# Patient Record
Sex: Male | Born: 1950
Health system: Southern US, Community
[De-identification: ages and names within clinical notes are randomized; demographics above are authoritative.]

## PROBLEM LIST (undated history)

## (undated) DIAGNOSIS — I10 Essential (primary) hypertension: Secondary | ICD-10-CM

## (undated) DIAGNOSIS — K219 Gastro-esophageal reflux disease without esophagitis: Secondary | ICD-10-CM

## (undated) DIAGNOSIS — R011 Cardiac murmur, unspecified: Secondary | ICD-10-CM

## (undated) DIAGNOSIS — C61 Malignant neoplasm of prostate: Secondary | ICD-10-CM

## (undated) DIAGNOSIS — J3089 Other allergic rhinitis: Secondary | ICD-10-CM

## (undated) DIAGNOSIS — Z923 Personal history of irradiation: Secondary | ICD-10-CM

## (undated) DIAGNOSIS — Z973 Presence of spectacles and contact lenses: Secondary | ICD-10-CM

## (undated) DIAGNOSIS — Z87898 Personal history of other specified conditions: Secondary | ICD-10-CM

## (undated) DIAGNOSIS — G4733 Obstructive sleep apnea (adult) (pediatric): Secondary | ICD-10-CM

## (undated) DIAGNOSIS — E7800 Pure hypercholesterolemia, unspecified: Secondary | ICD-10-CM

## (undated) DIAGNOSIS — M1990 Unspecified osteoarthritis, unspecified site: Secondary | ICD-10-CM

---

## 1997-04-01 HISTORY — PX: PROSTATECTOMY: SHX69

## 1997-12-22 ENCOUNTER — Ambulatory Visit (HOSPITAL_COMMUNITY): Admission: RE | Admit: 1997-12-22 | Discharge: 1997-12-22 | Payer: Self-pay | Admitting: *Deleted

## 1998-07-05 ENCOUNTER — Ambulatory Visit (HOSPITAL_COMMUNITY): Admission: RE | Admit: 1998-07-05 | Discharge: 1998-07-05 | Payer: Self-pay | Admitting: *Deleted

## 2000-01-10 ENCOUNTER — Encounter: Payer: Self-pay | Admitting: *Deleted

## 2000-01-10 ENCOUNTER — Ambulatory Visit: Admission: RE | Admit: 2000-01-10 | Discharge: 2000-01-10 | Payer: Self-pay | Admitting: *Deleted

## 2001-02-19 ENCOUNTER — Ambulatory Visit (HOSPITAL_BASED_OUTPATIENT_CLINIC_OR_DEPARTMENT_OTHER): Admission: RE | Admit: 2001-02-19 | Discharge: 2001-02-19 | Payer: Self-pay | Admitting: Orthopedic Surgery

## 2001-02-19 ENCOUNTER — Encounter (INDEPENDENT_AMBULATORY_CARE_PROVIDER_SITE_OTHER): Payer: Self-pay | Admitting: *Deleted

## 2001-02-19 HISTORY — PX: OTHER SURGICAL HISTORY: SHX169

## 2001-12-15 ENCOUNTER — Emergency Department (HOSPITAL_COMMUNITY): Admission: EM | Admit: 2001-12-15 | Discharge: 2001-12-15 | Payer: Self-pay | Admitting: Emergency Medicine

## 2002-02-03 ENCOUNTER — Encounter: Payer: Self-pay | Admitting: Urology

## 2002-02-03 ENCOUNTER — Encounter: Admission: RE | Admit: 2002-02-03 | Discharge: 2002-02-03 | Payer: Self-pay | Admitting: Urology

## 2002-09-15 ENCOUNTER — Encounter: Payer: Self-pay | Admitting: *Deleted

## 2002-09-15 ENCOUNTER — Ambulatory Visit (HOSPITAL_COMMUNITY): Admission: RE | Admit: 2002-09-15 | Discharge: 2002-09-15 | Payer: Self-pay | Admitting: *Deleted

## 2002-12-04 ENCOUNTER — Encounter: Payer: Self-pay | Admitting: Orthopedic Surgery

## 2002-12-08 ENCOUNTER — Encounter: Payer: Self-pay | Admitting: Orthopedic Surgery

## 2002-12-08 ENCOUNTER — Inpatient Hospital Stay (HOSPITAL_COMMUNITY): Admission: RE | Admit: 2002-12-08 | Discharge: 2002-12-11 | Payer: Self-pay | Admitting: Orthopedic Surgery

## 2002-12-08 HISTORY — PX: TOTAL HIP ARTHROPLASTY: SHX124

## 2002-12-12 ENCOUNTER — Emergency Department (HOSPITAL_COMMUNITY): Admission: EM | Admit: 2002-12-12 | Discharge: 2002-12-13 | Payer: Self-pay | Admitting: Emergency Medicine

## 2002-12-12 ENCOUNTER — Encounter: Payer: Self-pay | Admitting: Emergency Medicine

## 2003-07-03 ENCOUNTER — Ambulatory Visit (HOSPITAL_BASED_OUTPATIENT_CLINIC_OR_DEPARTMENT_OTHER): Admission: RE | Admit: 2003-07-03 | Discharge: 2003-07-03 | Payer: Self-pay | Admitting: Family Medicine

## 2003-07-22 ENCOUNTER — Ambulatory Visit (HOSPITAL_COMMUNITY): Admission: RE | Admit: 2003-07-22 | Discharge: 2003-07-22 | Payer: Self-pay | Admitting: Family Medicine

## 2003-07-22 ENCOUNTER — Encounter: Payer: Self-pay | Admitting: Cardiovascular Disease

## 2005-01-01 ENCOUNTER — Inpatient Hospital Stay (HOSPITAL_COMMUNITY): Admission: EM | Admit: 2005-01-01 | Discharge: 2005-01-04 | Payer: Self-pay | Admitting: Emergency Medicine

## 2005-01-03 ENCOUNTER — Encounter (INDEPENDENT_AMBULATORY_CARE_PROVIDER_SITE_OTHER): Payer: Self-pay | Admitting: Cardiology

## 2005-01-03 HISTORY — PX: TRANSTHORACIC ECHOCARDIOGRAM: SHX275

## 2005-01-04 ENCOUNTER — Ambulatory Visit: Payer: Self-pay | Admitting: Internal Medicine

## 2005-01-09 ENCOUNTER — Ambulatory Visit: Payer: Self-pay

## 2009-09-09 ENCOUNTER — Encounter: Admission: RE | Admit: 2009-09-09 | Discharge: 2009-09-09 | Payer: Self-pay | Admitting: Family Medicine

## 2009-11-01 ENCOUNTER — Encounter: Admission: RE | Admit: 2009-11-01 | Discharge: 2009-11-01 | Payer: Self-pay | Admitting: Internal Medicine

## 2009-11-18 ENCOUNTER — Ambulatory Visit: Admission: RE | Admit: 2009-11-18 | Discharge: 2009-11-18 | Payer: Self-pay | Admitting: Neurological Surgery

## 2009-12-09 ENCOUNTER — Encounter (INDEPENDENT_AMBULATORY_CARE_PROVIDER_SITE_OTHER): Payer: Self-pay | Admitting: Neurological Surgery

## 2009-12-09 ENCOUNTER — Inpatient Hospital Stay (HOSPITAL_COMMUNITY): Admission: RE | Admit: 2009-12-09 | Discharge: 2009-12-11 | Payer: Self-pay | Admitting: Neurological Surgery

## 2009-12-09 HISTORY — PX: POSTERIOR LAMINECTOMY / DECOMPRESSION LUMBAR SPINE: SUR740

## 2010-01-17 ENCOUNTER — Encounter: Admission: RE | Admit: 2010-01-17 | Discharge: 2010-01-17 | Payer: Self-pay | Admitting: Neurological Surgery

## 2010-06-14 ENCOUNTER — Encounter
Admission: RE | Admit: 2010-06-14 | Discharge: 2010-06-14 | Payer: Self-pay | Source: Home / Self Care | Attending: Neurological Surgery | Admitting: Neurological Surgery

## 2010-08-28 LAB — BASIC METABOLIC PANEL
Calcium: 10.3 mg/dL (ref 8.4–10.5)
Creatinine, Ser: 0.97 mg/dL (ref 0.4–1.5)
GFR calc Af Amer: >60 mL/min (ref >60)
GFR calc non Af Amer: >60 mL/min (ref >60)

## 2010-08-28 LAB — TYPE AND SCREEN: Antibody Screen: NEGATIVE

## 2010-08-28 LAB — CBC
MCHC: 33.5 g/dL (ref 30.0–36.0)
Platelets: 202 10*3/uL (ref 150–400)
RDW: 14.6 % (ref 11.5–15.5)
WBC: 6.6 10*3/uL (ref 4.0–10.5)

## 2010-08-28 LAB — SURGICAL PCR SCREEN
MRSA, PCR: NEGATIVE
Staphylococcus aureus: NEGATIVE

## 2010-08-29 LAB — COMPREHENSIVE METABOLIC PANEL
ALT: 34 U/L (ref 0–53)
AST: 30 U/L (ref 0–37)
Albumin: 3.9 g/dL (ref 3.5–5.2)
Alkaline Phosphatase: 47 U/L (ref 39–117)
CO2: 29 mEq/L (ref 19–32)
Chloride: 106 mEq/L (ref 96–112)
GFR calc Af Amer: >60 mL/min (ref >60)
GFR calc non Af Amer: >60 mL/min (ref >60)
Total Bilirubin: 0.6 mg/dL (ref 0.3–1.2)
Total Protein: 6.7 g/dL (ref 6.0–8.3)

## 2010-08-29 LAB — SURGICAL PCR SCREEN
MRSA, PCR: NEGATIVE
Staphylococcus aureus: NEGATIVE

## 2010-08-29 LAB — DIFFERENTIAL
Basophils Absolute: 0 10*3/uL (ref 0.0–0.1)
Basophils Relative: 0 % (ref 0–1)
Eosinophils Relative: 2 % (ref 0–5)
Lymphocytes Relative: 26 % (ref 12–46)
Monocytes Absolute: 0.5 10*3/uL (ref 0.1–1.0)
Monocytes Relative: 8 % (ref 3–12)

## 2010-08-29 LAB — PROTIME-INR: Prothrombin Time: 12.6 seconds (ref 11.6–15.2)

## 2010-08-29 LAB — URINALYSIS, ROUTINE W REFLEX MICROSCOPIC
Nitrite: NEGATIVE
Protein, ur: NEGATIVE mg/dL

## 2010-08-29 LAB — CBC
HCT: 40.2 % (ref 39.0–52.0)
RBC: 4.23 MIL/uL (ref 4.22–5.81)
RDW: 14.8 % (ref 11.5–15.5)

## 2010-08-29 LAB — TYPE AND SCREEN: ABO/RH(D): O POS

## 2010-10-28 NOTE — Op Note (Signed)
[facility]  Patient:    Keith Webb, RASNIC Visit Number: [id number] MRN: [id number]          Service Type: DSU Location: [facility] Attending Physician:  Susa Day Dictated by:   Katy Fitch Naaman Plummer., M.D. Proc. Date: 02/19/01 Admit Date:  02/19/2001   CC:         Luisa Hart L. Lurene Shadow, M.D.   Operative Report  PREOPERATIVE DIAGNOSIS:  Large multilobular mass involving right index finger proximal interphalangeal joint and extensor mechanism.  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  PROCEDURES: 1. Right index finger proximal interphalangeal joint synovectomy. 2. Excision of intratendinous, peritendinous, and subcutaneous giant cell    tumor, right index finger.  SURGEON:  Katy Fitch. Sypher, Montez Hageman., M.D.  ASSISTANT:  Jonni Sanger, P.A.  ANESTHESIA:  Axillary block.  Supervising anesthesiologist is Janetta Hora. Gelene Mink, M.D.  INDICATIONS:  Khian Remo is a 60 year old man referred by Dr. Lurene Shadow for evaluation and management of a complex mass involving the right index finger PIP joint and subcutaneous region on the dorsal aspect of the proximal phalanx.  Clinically this appeared to be either a multilobular ganglion or a giant cell tumor.  This had been enlarging rather rapidly and was causing some discomfort.  We advised excisional biopsy for diagnosis until we have a histopathologic evaluation completed.  DESCRIPTION OF PROCEDURE:  Jayceion Lisenby is brought to the operating room and placed in a supine position on the operating table.  Following placement of axillary block in the holding area by Dr. Gelene Mink, anesthesia is satisfactory in the right arm.  The arm was prepped with Betadine soap and solution  and sterilely draped.  Following exsanguination of the limb with Esmarch bandage, the arterial tourniquet was inflated to 260 mmHg due to mild systolic hypertension.  Ancef 1 g was administered as an IV prophylactic antibiotic prior to surgery.  The procedure commenced with a curvilinear incision exposing the extensor mechanism.  The longitudinal dorsal veins were preserved, and several transverse veins were electrocauterized.  The mass was a very complex, fungating giant cell-type lesion that was extending through the extensor mechanism, involving the radial lateral band, central slip, and ulnar lateral band.  There was a highly cellular, very firm portion of this giant cell tumor that extended over the dorsal aspect of the proximal phalanx deep to the extensor mechanism and infiltrated deep to the collateral ligaments and involved both the deep and superficial sides of the PIP joint capsule.  I performed a dorsal capsulectomy and a wide marginal resection of this lesion.  I removed all the periosteum of the dorsum of the proximal phalanx deep to the mass and used the electrocautery deep to the collateral ligaments to try to destroy any residual cells.  The infiltration of this lesion through the extensor mechanism and firm and highly cellular appearance was a bit worrisome.  I had to resect portions of the extensor mechanism with the mass to get a margin.  Approximately two-thirds of the central slip was preserved, and half of each lateral band was preserved, resecting the lesion with a small margin of normal-appearing tendon.  The PIP joint was inspected and palpated carefully.  I could not detect a definite extension to the volar aspect of the PIP joint; therefore, a volar incision was not completed.  Care  was taken to remove all pathologic-feeling tissue.  The wound was meticulously inspected for hemostasis, which was achieved with bipolar cautery, followed by irrigation  of the wound with sterile saline.  I then repaired the intervals between the central slip and the radial lateral band and the central slip and the ulnar lateral band with mattress sutures of 4-0 Mersilene.  The skin was then repaired with intradermal 3-0 Prolene.  A compressive dressing was applied with Xeroflo, sterile gauze, and Coban, followed by an Alumafoam splint to rest the PIP joint in full extension.  There were no apparent complications.  Mr. Cybulski tolerated the surgery and anesthesia well.  He was transferred to the recovery room with table vital signs.  We would anticipate our histopathology returning in approximately 48 hours. Dictated by:   Katy Fitch Naaman Plummer., M.D. Attending Physician:  Susa Day DD:  02/19/01 TD:  02/19/01 Job: [phone] [id number] [id number]

## 2010-10-28 NOTE — Op Note (Signed)
Keith Webb, Keith Webb                          ACCOUNT NO.:  [id number]   MEDICAL RECORD NO.:  [id number]                   PATIENT TYPE:  INP   LOCATION:  2869                                 FACILITY:  [facility]   PHYSICIAN:  Elana Alm. Thurston Hole, M.D.              DATE OF BIRTH:  Apr 11, 1951   DATE OF PROCEDURE:  12/08/2002  DATE OF DISCHARGE:                                 OPERATIVE REPORT   PREOPERATIVE DIAGNOSIS:  Left hip degenerative joint disease.   POSTOPERATIVE DIAGNOSIS:  Left hip degenerative joint disease.   PROCEDURE:  Left total hip replacement using [employer] total hip system with  acetabulum #62 mm Trident Press-Fit acetabulum with two 20 mm locking screws  and 10 degree polyethylene liner. Femoral component #9 Secure-Fit Plus with  13 mm distal tip and plus 0 x 36 mm ceramic femoral head.   SURGEON:  Elana Alm. Thurston Hole, M.D.   ASSISTANT:  Julien Girt, P.A.   ANESTHESIA:  General.   OPERATIVE TIME:  1 hour and 45 minutes.   ESTIMATED BLOOD LOSS:  450 mL   COMPLICATIONS:  None.   DESCRIPTION OF PROCEDURE:  Mr. Seabrook was brought to the operating room on  December 08, 2002, placed on the operating table in supine position. After an  adequate level of general anesthesia was obtained, his left hip was  examined. He had flexion to 90, extension to zero, internal and external  rotation of 30 degrees. He had approximately 1 inch of leg length shortening  on the left compared to the right. He had a Foley catheter placed under  sterile conditions and received Ancef 2 g IV preoperatively for prophylaxis.  He was then turned in the left lateral decubitus position and secured on the  bed with a Mark frame. His left hip and leg was prepped using sterile  duraprep and draped using sterile technique. Originally through a 15 cm  posterolateral greater trochanteric incision, initial exposure was made. The  underlying subcutaneous tissues were incised along the skin incision.  The  iliotibial band and gluteus maximus fascia was incised longitudinally  revealing the underlying sciatic nerve which was carefully protected. The  short external rotators of the hip and hip capsule were released off the  femoral neck insertion and tagged. The hip was then posteriorly dislocated.  A femoral neck cut was then made 1 1/2 cm above the lesser trochanter and  the appropriate amount of adduction and anteversion and inclination. The  femoral head was examined and found to have grade 3 and 4 DJD.  The  acetabulum was carefully exposed with retractors. Grade 3 and 4  chondromalacia noted there as well. Degenerative labrum was thoroughly  removed from around the edges. Sequential acetabular reaming was then  carried out up to a 62 mm size. There were found to be small cysts in and  around the acetabulum and these were thoroughly curetted out  and then filled  with bone graft from the femoral head. After this was done, a 62 mm trial  was hammered in its position with an excellent fit. It was then removed and  the actual 62 mm Trident cup was hammered into position and the appropriate  amount of anteversion and inclination with an excellent fit. It was further  secured with two separate 20 mm locking screws, one in the 1 o'clock and one  in the 12 o'clock position. After this was done, a 10 degree polyethylene  liner was then hammered into position with the 10 degree lip in the  posterolateral position. The proximal femur was then exposed. Sequential  reaming was carried out up to a #9 size followed by broaches to a #9 size  and distal reaming to a 13.5. With a #9 femoral trial in place and a plus 0  x 36 mm femoral head, the hip was reduced, taken through a full range of  motion and found to be excellent and stable up to 80 degrees of internal  rotation and in 30 degrees of adduction as well and stable in abduction and  external rotation and leg lengths were found to be equalized.  It was then  dislocated and the femoral prosthesis trial was removed and then the actual  #9 Secure-Fit Plus prosthesis with a 13 mm distal tip was then hammered into  position with an excellent fit and then a 36 mm plus zero ceramic femoral  head was hammered onto the femoral neck with an excellent fit. The hip then  reduced, taken through a full range of motion, found to be stable and leg  lengths equal. At this point, it was felt that all the components were  excellent size, fit and stability. The wound was further irrigated with  antibiotic solution. The short external rotators of the hip and hip capsule  were reattached through their femoral neck insertion through two drill holes  in the greater trochanter. The iliotibial band and gluteus maximus fascia  was closed with #1 Ethibond suture. The subcutaneous tissues were closed  with #0 and 2-0 Vicryl's, skin closed with skin staples, sterile dressings  were applied. The patient then turned supine. Checked the leg lengths which  were equal, rotation equal, neurovascular status normal. Placed in an  abduction pillow. The patient then awakened, extubated and taken to the  recovery room in stable condition. Needle and sponge counts were correct x2  at the end of the case.                                               Robert A. Thurston Hole, M.D.    RAW/MEDQ  D:  12/08/2002  T:  12/08/2002  Job:  [id number]

## 2010-10-28 NOTE — H&P (Signed)
Keith Webb, Keith Webb                ACCOUNT NO.:  [id number]   MEDICAL RECORD NO.:  [id number]          PATIENT TYPE:  INP   LOCATION:  2017                         FACILITY:  [facility]   PHYSICIAN:  Renato Battles, M.D.     DATE OF BIRTH:  14-Jan-1951   DATE OF ADMISSION:  01/01/2005  DATE OF DISCHARGE:                                HISTORY & PHYSICAL   REASON FOR ADMISSION:  Syncope.   PRIMARY CARE PHYSICIAN:  Dr. Parke Simmers.   HPI:  Patient is a very pleasant 60 year old African-American male who got  lightheaded at the end of dinner, after eating some lobster and crab legs  with his family, and then he completely passed out for about 10 seconds,  came back spontaneously and was back to normal self immediately.  He denies  any chest pain, no shortness of breath, no palpitation, no nausea, no  vomiting.  He reportedly had two similar episodes in the past, one of them  when he was sitting in the bathroom and one when he was eating a similar  kind of food before.  It is notable though that when EMS arrived at the  scene to transfer patient he was very hypotensive with a blood pressure in  the 70s and 80s systolic.   At this time patient reports no complaints, feels fine.   REVIEW OF SYSTEMS:  CONSTITUTIONAL:  No fever, chills or night sweats, no  weight changes.  GI:  No nausea or vomiting, no diarrhea or constipation.  CARDIOPULMONARY:  No chest pain, no orthopnea, no PND, no shortness of  breath, no palpitations.  GU:  No dysuria, hematuria or retention.   PAST MEDICAL HISTORY:  1.  Hypertension.  2.  Hypercholesterolemia.  3.  Left hip replacement.  4.  History of prostate cancer.  5.  Total open prostatectomy.   FAMILY HISTORY:  Positive for hypertension.   SOCIAL HISTORY:  He smokes cigars.  He said one cigar lasts him 2 or 3 days.  He drinks 2 or 3 drinks a day, no drugs, although he reported remote history  of smoking marijuana and cocaine, no IV use.  He works as a Contractor.   ALLERGIES:  NO KNOWN DRUG ALLERGIES.   MEDICATIONS:  1.  Aspirin daily.  2.  Advicor unknown dose.  3.  Lotrel unknown dose.  4.  Hydrochlorothiazide 25 mg p.o. daily.   PHYSICAL EXAM:  GENERAL:  Patient is alert, oriented x3, no acute distress.  VITALS:  Temperature 98.2, heart rate 75, respiratory rate 18, blood  pressure 109/69.  HEENT:  The head is atraumatic, normocephalic.  Pupils equal, round and  reactive to light and accommodation.  Extraocular movements intact  bilaterally.  NECK:  No lymphadenopathy, no thyromegaly, no JVD.  CHEST:  Is clear to auscultation bilaterally.  No wheezing, rales or  rhonchi.  HEART:  Regular rate and rhythm, no murmurs.  ABDOMEN:  Soft, nontender, nondistended, normal active bowel sounds.  EXTREMITIES:  No cyanosis, edema or clubbing.  NEUROLOGIC EXAM:  Grossly negative.   STUDIES:  CBCs were within normal.  Electrolytes were all normal including kidney function and glucose.   Liver functions were perfectly normal.   Cardiac enzymes:  First set was negative.  EKG was normal.  Head CT was  negative with no acute events.   ASSESSMENT:  1.  Syncope of unknown origin, most likely neural.  2.  Hypertension.  3.  Hypercholesterolemia.   PLAN:  1.  Admit to telemetry.  2.  Rule out MI with 3 sets of cardiac enzymes.  3.  Continue aspirin.  4.  Continue Lotrel and Advicor.  5.  Check fasting lipids.       SA/MEDQ  D:  01/01/2005  T:  01/01/2005  Job:  [id number]   cc:   Renaye Rakers, M.D.  [phone] [address], [address]  [place]  [place] [address]  Fax: [phone]

## 2010-10-28 NOTE — Discharge Summary (Signed)
NAMEHARVY, Keith Webb                ACCOUNT NO.:  [id number]   MEDICAL RECORD NO.:  [id number]          PATIENT TYPE:  INP   LOCATION:  2017                         FACILITY:  [facility]   PHYSICIAN:  Michaelyn Barter, M.D. DATE OF BIRTH:  June 29, 1950   DATE OF ADMISSION:  12/31/2004  DATE OF DISCHARGE:  01/04/2005                                 DISCHARGE SUMMARY   PRIMARY CARE PHYSICIAN:  Dr. Adrian Saran bland   FINAL DIAGNOSIS:  1.  Syncope.  2.  Orthostatic hypotension.  3.  Bradycardia.   SECONDARY DIAGNOSIS:  1.  Hypertension.  2.  Hyperlipidemia.   PROCEDURES:  1.  Head CT completed on January 01, 2005.  2.  2-D echocardiogram completed on January 03, 2005.  3.  Carotid Dopplers.   CONSULTATIONS:  Cardiology with [facility]   HISTORY OF PRESENT ILLNESS:  Mr. Fendley is a 60 year old gentleman who  arrived with a chief complaint of syncope.  He stated that he was at [employer] and crab legs and he became lightheaded at the end of  his meal.  Subsequently, he passed out for approximately 10 seconds.  He  spontaneously returned to his normal self afterwards.  He denied any chest  pain, shortness of breath, palpitations, nausea, or emesis.  He was brought  to the emergency room for further evaluation.   PAST MEDICAL HISTORY:  1.  Prior episodes of syncope x2, one of which was associated with eating      crab legs.  The second one was associated with sitting on the toilet.  2.  Hypertension.  3.  Hypercholesterolemia.  4.  History of prostate cancer.   PAST SURGICAL HISTORY:  1.  Left hip replacement.  2.  Total open prostatectomy.   FAMILY HISTORY:  Positive for hypertension.   SOCIAL HISTORY:  Positive for cigars.  Alcohol:  He drinks two to three  drinks per day.  Street drugs:  The patient denies excluding marijuana which  the patient has a remote history of.   ALLERGIES:  No known drug allergies.   HOSPITAL COURSE:  #1 - SYNCOPE:  Following the  patient's visit to the  emergency room he had a CAT scan completed of his head the final results of  which were no acute findings, mild sphenoid mucosal thickening.  Likewise,  the patient was admitted to telemetry floor and after which time he had his  cardiac enzymes checked and his troponin I was found to be 0.01.  Likewise,  he also had an EKG completed at the time of his admission, the results of  which revealed sinus bradycardia with sinus arrhythmia, nonspecific T-wave  abnormality.  Over the course of his hospitalization he did not have any  repeat episodes of syncope nor presyncopal-like symptoms.  His cardiac  enzymes were 0.01 x3 over the course of his hospitalization.  Likewise, his  CK-MB were found to be 2.2, 2.6, and 3.1, respectively.  Again, the patient  appeared to do well over the course of his hospitalization with no repeat  episodes of syncope.   #2 -  ORTHOSTATIC HYPOTENSION:  Over the course of the patient's  hospitalization he was found to be orthostatic.  His vitals were checked  numerous times over the course of his hospitalization.  He was provided  aggressive IV fluid hydration with 0.9 normal saline.  He had previously  been on a diuretic.  That was subsequently stopped and, again, over the  course of his hospitalization his vitals began to normalize   #3 - BRADYCARDIA:  During the patient's hospitalization telemetry displayed  multiple episodes whereby the patient's heart rate dipped into the mid 40s.  The patient appeared to be asymptomatic during these episodes.  However, the  decision was made to consult [facility].  Following consultation  with [facility] cardiology the decision has been made for the patient to have  an outpatient stress test to be completed.  In addition, they also stated  that he should avoid diuretics and also alcohol.  By the date of discharge  the patient's bradycardia had resolved, however.   #4 - HYPERTENSION:  The patient  was treated with a combination of Norvasc  2.5 mg p.o. daily, Lotensin 10 mg daily throughout his hospitalization.  His  blood pressure remained normotensive to slightly elevated, however, over the  course of his hospitalization.   #5 - HYPERLIPIDEMIA:  For this the patient was placed on Mevacor which was  his home medication.  However, he could not remember the dose.  Niacin 500  mg q.h.s. was also added.  He did have a fasting lipid profile completed and  results of that was cholesterol of 200, triglycerides of 306, HDL of 33, LDL  of 106.   CONDITION AT THE TIME OF DISCHARGE:  The patient's condition is improved at  the time of discharge.  He currently states that he is ready to go home.  He  will be discharged home on the following medications.   DISCHARGE MEDICATIONS:  1.  Lotrel 2.5/10 mg p.o. daily.  2.  Aspirin 325 mg p.o. daily.  3.  Advicor and the patient will be instructed to resume his prior dosage.  4.  Niacin 500 mg one tablet daily.   In addition, the patient had a 2-D echocardiogram completed during this  hospitalization, the final results of which were as follows:  Overall left  ventricular systolic function was normal.  Left ventricular ejection  fraction was estimated to range between 55-65%.  Although no diagnostic left  ventricular regional wall motion abnormality was identified, this  possibility can not be completely excluded on the basis of the study.  Left  ventricular wall thickness was mildly increased.  Left ventricular diastolic  function parameters were normal.  There was trivial aortic valve  regurgitation.  Impression:  Since the previous report of July 22, 2003  no apparent changes in the LV ejection fraction.  Again, the patient will be  discharged home.   DISCHARGE INSTRUCTIONS:  1.  Avoid diuretics and alcohol.  2.  Make sure that he drinks plenty of fluids. 3.  See his primary care physician, Dr. Renaye Rakers within two to four       weeks.       OR/MEDQ  D:  01/04/2005  T:  01/04/2005  Job:  [id number]   cc:   Renaye Rakers, M.D.  [facility] [address]  [place]  [place] [address]  Fax: [phone]

## 2010-10-28 NOTE — Consult Note (Signed)
NAMETALYN, DESSERT NO.:  [id number]   MEDICAL RECORD NO.:  [id number]          PATIENT TYPE:  INP   LOCATION:  2017                         FACILITY:  [facility]   PHYSICIAN:  Duke Salvia, M.D.  DATE OF BIRTH:  [birth year]   DATE OF CONSULTATION:  DATE OF DISCHARGE:                                   CONSULTATION   Thank you very much for asking [place] to see Keith Webb in consultation  because of syncope and bradycardia.   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  He had a little bit of alcohol.  He had the onset of warmth and wooziness.  He then subsequently lost consciousness.  EMS was called.  They found him to  be diaphoretic and clammy.  His blood pressure was noted to be 50.  His  pulse was noted between 40 and 70.  They then tried to get him up onto a  gurney.  He lost consciousness again.  There was no record at that point  whether there was a further change in his heart rate.  He was given a volume  bolus, and his blood pressure gradually improved.   While in the hospital, he has had episodes of significant bradycardia with  heart rates down into the 50s and low 40s, occurring both at night as well  as specifically 2 a.m. on July 25 as well as during the day at 1719 hours on  July 24.   The patient has a history of prior syncope on two occasions, one also while  having crab and lobster, and the other following a hip surgery and post  either defecation or micturition.   He has had no other syncope.   His exercise tolerance is not bad.  He denies chest pain.  His cardiac risk  factors, however, are notable for hypertension, dyslipidemia and a strong  family history.   PAST MEDICAL HISTORY:  In addition to above is notable for obstructive sleep  apnea, for which he has been prescribed a mask but does not wear it very  often.   MEDICATIONS ON  ADMISSION:  1.  Niacin.  2.  Lovastatin.  3.  Norvasc 2.5.  4.  Aspirin.  5.  Lotensin.  6.  Hydrochlorothiazide.   He has no known drug allergies.   REVIEW OF SYSTEMS:  Largely unrelated.  He does have significant discomfort  in his knees, which has an impact on his exercise tolerance.   SOCIAL HISTORY:  As noted previously.   He smokes a cigar every couple of days and drinks a couple of drinks a day.  There is no history recent drug use.   PHYSICAL EXAMINATION:  GENERAL APPEARANCE:  On examination, he is a middle-  aged African-American male appearing his stated age of 89.  VITAL SIGNS:  Orthostatic vital signs were unrevealing with a blood pressure  of about 130/90 on multiple occasions, as high as 159/95 being recorded.  HEENT:  Demonstrated no icterus or  xanthoma.  NECK:  The neck veins were flat.  The carotids were brisk and full  bilaterally without bruits.  BACK:  Without kyphosis or scoliosis.  LUNGS:  Clear.  HEART:  Sounds were regular with an S4.  ABDOMEN:  Soft with active bowel sounds without __________ pulsation or  hepatomegaly.  EXTREMITIES:  Femoral pulses were 2+.  Distal pulses were intact.  There was  no clubbing, cyanosis or edema.  NEUROLOGICAL:  Exam was grossly normal.  SKIN:  Warm and dry.   Blood work was notable for an anemia with a hemoglobin of 12.6.  Electrocardiogram dated July 24 demonstrated sinus rhythm at 55 with  intervals of 0.15/0.08/0.44 with an axis of 45 degrees.  There were  nonspecific T wave changes in the lateral precordium, and there is poor R  wave progression.   The patient ruled out for myocardial infarction.  Laboratories were notable  for an LDL of 106 on therapy and HDL of 33.   IMPRESSION:  1.  Recurrent syncope, probably neurally mediated.  2.  Bradycardia, probably unrelated to number one but potentially related to      number three.  3.  Obstructive sleep apnea.  4.  Cardiac risk factors of:      1.   Hypertension.      2.  Hypercholesterolemia.      3.  Family history.  5.  Prior hip surgery and problem with knees.   Keith Webb probably had neurally mediated syncope.  This is strongly  supported by his history as well as documentation by EMS suggesting  prolonged hypotension unrelated to changes in his heart rate which were  recorded between 40 to 70.  Furthermore, there was recurrent syncope  associated with being put on the gurney, which I assumed was accompanied by  a change in his position from flat to more vertical.   Of concern, with his cardiac risk factors, is that this might represent a  Bezold-Jarisch reflex, although the likelihood that this is so is fairly  remote, and furthermore this occurred while sitting and not exercising.  However, I think it is reasonable to exclude inferior wall ischemia.  In  addition, given his bradycardia, treadmill testing will allow [place] to assess  chronotropic competence.   RECOMMENDATIONS:  Based on the above, therefore:  1.  I think it is reasonable for him to go home.  2.  He will need to maintain hydration, particularly at work.  3.  Be aware of the prodrome that occurred at the restaurant and will need      to become immediately recumbent when that occurs.  4.  Stress test to exclude inferior ischemia as well as chronotropic      incompetence.  5.  Avoid diuretics, as they will further deplete the intravascular system.  6.  Minimize alcohol, as it also is a diuretic.   Thanks for the consultation.       SCK/MEDQ  D:  01/04/2005  T:  01/04/2005  Job:  [id number]   cc:   Renaye Rakers, M.D.  [phone]  [place]  [place] [address]  Fax: [phone]

## 2013-04-16 ENCOUNTER — Other Ambulatory Visit: Payer: Self-pay | Admitting: Neurological Surgery

## 2013-04-16 DIAGNOSIS — M549 Dorsalgia, unspecified: Secondary | ICD-10-CM

## 2013-04-29 ENCOUNTER — Ambulatory Visit
Admission: RE | Admit: 2013-04-29 | Discharge: 2013-04-29 | Disposition: A | Discharge status: Home / Self Care | Payer: BC Managed Care – PPO | Source: Ambulatory Visit | Attending: Neurological Surgery | Admitting: Neurological Surgery

## 2013-04-29 VITALS — BP 119/78 | HR 67

## 2013-04-29 DIAGNOSIS — M549 Dorsalgia, unspecified: Secondary | ICD-10-CM

## 2013-04-29 MED ORDER — DIAZEPAM 5 MG PO TABS
10.0000 mg | ORAL_TABLET | Freq: Once | ORAL | Status: AC
  Administered 2013-04-29: 10 mg via ORAL

## 2013-04-29 MED ORDER — IOHEXOL 180 MG/ML  SOLN
17.0000 mL | Freq: Once | INTRAMUSCULAR | Status: AC | PRN
  Administered 2013-04-29: 17 mL via INTRATHECAL

## 2013-10-22 ENCOUNTER — Other Ambulatory Visit (HOSPITAL_COMMUNITY): Payer: Self-pay | Admitting: Urology

## 2013-10-22 DIAGNOSIS — C61 Malignant neoplasm of prostate: Secondary | ICD-10-CM

## 2013-10-30 ENCOUNTER — Encounter (HOSPITAL_COMMUNITY)
Admission: RE | Admit: 2013-10-30 | Discharge: 2013-10-30 | Disposition: A | Discharge status: Home / Self Care | Payer: BC Managed Care – PPO | Source: Ambulatory Visit | Attending: Urology | Admitting: Urology

## 2013-10-30 ENCOUNTER — Ambulatory Visit (HOSPITAL_COMMUNITY)
Admission: RE | Admit: 2013-10-30 | Discharge: 2013-10-30 | Disposition: A | Discharge status: Home / Self Care | Payer: BC Managed Care – PPO | Source: Ambulatory Visit | Attending: Urology | Admitting: Urology

## 2013-10-30 DIAGNOSIS — M259 Joint disorder, unspecified: Secondary | ICD-10-CM | POA: Insufficient documentation

## 2013-10-30 DIAGNOSIS — Z96649 Presence of unspecified artificial hip joint: Secondary | ICD-10-CM | POA: Insufficient documentation

## 2013-10-30 DIAGNOSIS — M51379 Other intervertebral disc degeneration, lumbosacral region without mention of lumbar back pain or lower extremity pain: Secondary | ICD-10-CM | POA: Insufficient documentation

## 2013-10-30 DIAGNOSIS — M5137 Other intervertebral disc degeneration, lumbosacral region: Secondary | ICD-10-CM | POA: Insufficient documentation

## 2013-10-30 DIAGNOSIS — C61 Malignant neoplasm of prostate: Secondary | ICD-10-CM

## 2013-10-30 MED ORDER — TECHNETIUM TC 99M MEDRONATE IV KIT
26.5000 | PACK | Freq: Once | INTRAVENOUS | Status: AC | PRN
  Administered 2013-10-30: 26.5 via INTRAVENOUS

## 2014-05-22 ENCOUNTER — Encounter: Payer: Self-pay | Admitting: Radiation Oncology

## 2014-05-22 NOTE — Progress Notes (Signed)
GU Location of Tumor / Histology: prostate adenocarcinoma, dx 1998  If Prostate Cancer, Gleason Score is (3 + 4) and PSA is (18.6 in 1998)  [name] presented  In 1998 with signs/symptoms of: elevated PSA  Biopsies of  (if applicable) revealed: prostatectomy in 1998, Gleason 3+4=7, PSA 18.6  Past/Anticipated interventions by urology, if any: radical prostatectomy 04/01/97  Past/Anticipated interventions by medical oncology, if any: no  Weight changes, if any: no  Bowel/Bladder complaints, if any:  Pt completing IPSS  Nausea/Vomiting, if any: no  Pain issues, if any:  Occasional pain in right leg  SAFETY ISSUES:  Prior radiation? no  Pacemaker/ICD? no  Possible current pregnancy? Na  Is the patient on methotrexate? 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 Most current PSA 0.34.  Being seen for salvage radiation therapy.

## 2014-05-25 ENCOUNTER — Encounter: Payer: Self-pay | Admitting: Radiation Oncology

## 2014-05-26 ENCOUNTER — Encounter: Payer: Self-pay | Admitting: Radiation Oncology

## 2014-05-26 ENCOUNTER — Ambulatory Visit
Admission: RE | Admit: 2014-05-26 | Discharge: 2014-05-26 | Disposition: A | Discharge status: Home / Self Care | Payer: BLUE CROSS/BLUE SHIELD | Source: Ambulatory Visit | Attending: Radiation Oncology | Admitting: Radiation Oncology

## 2014-05-26 VITALS — BP 140/98 | HR 74 | Temp 98.7°F | Resp 20 | Ht 68.0 in | Wt 176.2 lb

## 2014-05-26 DIAGNOSIS — Z87891 Personal history of nicotine dependence: Secondary | ICD-10-CM | POA: Diagnosis not present

## 2014-05-26 DIAGNOSIS — Z510 Encounter for antineoplastic radiation therapy: Secondary | ICD-10-CM | POA: Diagnosis not present

## 2014-05-26 DIAGNOSIS — Z792 Long term (current) use of antibiotics: Secondary | ICD-10-CM | POA: Insufficient documentation

## 2014-05-26 DIAGNOSIS — Z9079 Acquired absence of other genital organ(s): Secondary | ICD-10-CM | POA: Insufficient documentation

## 2014-05-26 DIAGNOSIS — Z791 Long term (current) use of non-steroidal anti-inflammatories (NSAID): Secondary | ICD-10-CM | POA: Diagnosis not present

## 2014-05-26 DIAGNOSIS — Z79899 Other long term (current) drug therapy: Secondary | ICD-10-CM | POA: Insufficient documentation

## 2014-05-26 DIAGNOSIS — Z8042 Family history of malignant neoplasm of prostate: Secondary | ICD-10-CM | POA: Diagnosis not present

## 2014-05-26 DIAGNOSIS — C61 Malignant neoplasm of prostate: Secondary | ICD-10-CM | POA: Diagnosis not present

## 2014-05-26 DIAGNOSIS — Z8546 Personal history of malignant neoplasm of prostate: Secondary | ICD-10-CM | POA: Diagnosis not present

## 2014-05-26 DIAGNOSIS — E780 Pure hypercholesterolemia: Secondary | ICD-10-CM | POA: Diagnosis not present

## 2014-05-26 DIAGNOSIS — Z7982 Long term (current) use of aspirin: Secondary | ICD-10-CM | POA: Diagnosis not present

## 2014-05-26 DIAGNOSIS — I10 Essential (primary) hypertension: Secondary | ICD-10-CM | POA: Insufficient documentation

## 2014-05-26 HISTORY — DX: Essential (primary) hypertension: I10

## 2014-05-26 HISTORY — DX: Pure hypercholesterolemia, unspecified: E78.00

## 2014-05-26 NOTE — Progress Notes (Signed)
[facility] NEW PATIENT EVALUATION  Name: Keith Webb MRN: [id number]  Date:   05/26/2014           DOB: [birth year]  Status: outpatient   CC: Keith Peers, MD  Arvil Persons, MD    REFERRING PHYSICIAN: Arvil Persons, MD   DIAGNOSIS: PSA recurrent carcinoma the prostate   HISTORY OF PRESENT ILLNESS:  Keith Webb is a 63 y.o. male who is a most pleasant 63 year old male who is seen today through the courtesy of Dr. Janice Norrie for consideration of salvage radiation therapy in the management of his PSA recurrent carcinoma the prostate.  On 04/01/1997 he underwent a radical prostatectomy with Dr. Janice Norrie after presenting with a PSA of 18.6 and prostate biopsies diagnostic for Gleason 7 (4+3) carcinoma.  He was found to have Gleason 7 (3+4) disease.  There was involvement of the right apical margin but other margins were free.  Lymph nodes were negative.  He had a disease-free interval until October 2013 when his PSA was 0.24.  This rose to 0.28 by November 2014, and most recently 0.34 on 04/20/2014.  His staging workup included a negative CT of the abdomen/pelvis on 05/06/2014 and a negative bone scan on 10/30/2013.  He is doing well from a GU and GI standpoint.  He does have erectile dysfunction which responds reasonably well to Viagra.  PREVIOUS RADIATION THERAPY: No   PAST MEDICAL HISTORY:  has a past medical history of Hypercholesterolemia; Hypertension; Sleep apnea; and Prostate cancer (1998).     PAST SURGICAL HISTORY:  Past Surgical History  Procedure Laterality Date  . Back surgery    . Elbow surgery    . Hip surgery    . Wrist carpectomy Bilateral   . Prostatectomy  1998    gleason 7     FAMILY HISTORY: family history includes Cancer in his brother; Diabetes in his sister; Stroke in his mother.  His mother died following a stroke at 26.  His father died from congestive heart failure in his late 55s.  A brother was recently diagnosed with prostate  cancer in his mid to late 26s.   SOCIAL HISTORY:  reports that he quit smoking about 10 years ago. His smoking use included Cigars. He does not have any smokeless tobacco history on file. He reports that he uses illicit drugs (Marijuana and Cocaine). He reports that he does not drink alcohol.  Married, 2 daughters.  Works as a Engineer, mining.   ALLERGIES: Review of patient's allergies indicates no known allergies.   MEDICATIONS:  Current Outpatient Prescriptions  Medication Sig Dispense Refill  . amLODipine (NORVASC) 10 MG tablet Take 10 mg by mouth daily.    Marland Kitchen amoxicillin (AMOXIL) 500 MG capsule Take 500 mg by mouth 3 (three) times daily.    Marland Kitchen aspirin 325 MG tablet Take 325 mg by mouth daily.    . cyclobenzaprine (FLEXERIL) 10 MG tablet Take 10 mg by mouth 3 (three) times daily as needed for muscle spasms.    . Dexlansoprazole (DEXILANT PO) Take by mouth.    . Diclofenac (ZORVOLEX) 35 MG CAPS Take by mouth.    . ezetimibe-simvastatin (VYTORIN) 10-40 MG per tablet Take 1 tablet by mouth daily.    . furosemide (LASIX) 40 MG tablet Take 40 mg by mouth.    . guanFACINE (INTUNIV) 1 MG TB24 Take 1 mg by mouth daily.    Marland Kitchen losartan (COZAAR) 100 MG tablet Take 100 mg by mouth  daily.    . meloxicam (MOBIC) 15 MG tablet Take 15 mg by mouth daily.    . montelukast (SINGULAIR) 10 MG tablet Take 10 mg by mouth at bedtime.    . niacin (NIASPAN) 1000 MG CR tablet Take 1,000 mg by mouth at bedtime.    . sildenafil (VIAGRA) 100 MG tablet Take 100 mg by mouth daily as needed for erectile dysfunction.     No current facility-administered medications for this encounter.     REVIEW OF SYSTEMS:  Pertinent items are noted in HPI.    PHYSICAL EXAM:  height is 5\' 8"  (1.727 m) and weight is 176 lb 3.2 oz (79.924 kg). His oral temperature is 98.7 F (37.1 C). His blood pressure is 140/98 and his pulse is 74. His respiration is 20.   Alert and oriented 63 year old black male appearing younger than his  stated age.  Head and neck examination: Grossly unremarkable.  Nodes: Without palpable cervical or supraclavicular lymphadenopathy.  Chest: Lungs clear.  Abdomen: Without masses organomegaly.  Genitalia: Unremarkable to inspection.  Rectal: Prostate bed is flat and is without masses or nodularity.  Extremities: Without edema.   LABORATORY DATA:  Lab Results  Component Value Date   WBC 6.6 12/06/2009   HGB 13.6 12/06/2009   HCT 40.7 12/06/2009   MCV 94.6 12/06/2009   PLT 202 12/06/2009   Lab Results  Component Value Date   NA 140 12/06/2009   K 4.6 12/06/2009   CL 104 12/06/2009   CO2 29 12/06/2009   Lab Results  Component Value Date   ALT 34 11/12/2009   AST 30 11/12/2009   ALKPHOS 47 11/12/2009   BILITOT 0.6 11/12/2009   PSA 0.34 from 04/20/2014   IMPRESSION: PSA recurrent carcinoma the prostate.  I explained to the patient and his wife that clinical predictors for local failure alone, and therefore possible salvage radiation therapy include a positive margin, a disease-free interval, initial Gleason score of 7 or less, and a slow PSA doubling time.  It is felt that success for salvage is better in patients to receive radiation therapy before the PSA reaches 0.5.  One would predict a good chance for salvage with radiation therapy.  We discussed the potential acute and late toxicities of radiation therapy.  We also discussed bladder filling to minimize urinary toxicity.  Consent is signed today.   PLAN: As discussed above.  He will return in early January for CT simulation.  I spent 45  minutes face to face with the patient and more than 50% of that time was spent in counseling and/or coordination of care.

## 2014-05-26 NOTE — Progress Notes (Signed)
Please see the Nurse Progress Note in the MD Initial Consult Encounter for this patient. 

## 2014-06-12 DIAGNOSIS — Z510 Encounter for antineoplastic radiation therapy: Secondary | ICD-10-CM | POA: Diagnosis present

## 2014-06-12 DIAGNOSIS — C61 Malignant neoplasm of prostate: Secondary | ICD-10-CM | POA: Diagnosis not present

## 2014-06-12 DIAGNOSIS — Z87891 Personal history of nicotine dependence: Secondary | ICD-10-CM | POA: Diagnosis not present

## 2014-06-12 DIAGNOSIS — Z8546 Personal history of malignant neoplasm of prostate: Secondary | ICD-10-CM | POA: Diagnosis not present

## 2014-06-12 DIAGNOSIS — Z792 Long term (current) use of antibiotics: Secondary | ICD-10-CM | POA: Diagnosis not present

## 2014-06-12 DIAGNOSIS — Z79899 Other long term (current) drug therapy: Secondary | ICD-10-CM | POA: Diagnosis not present

## 2014-06-12 DIAGNOSIS — Z791 Long term (current) use of non-steroidal anti-inflammatories (NSAID): Secondary | ICD-10-CM | POA: Diagnosis not present

## 2014-06-12 DIAGNOSIS — Z9079 Acquired absence of other genital organ(s): Secondary | ICD-10-CM | POA: Diagnosis not present

## 2014-06-12 DIAGNOSIS — I10 Essential (primary) hypertension: Secondary | ICD-10-CM | POA: Diagnosis not present

## 2014-06-12 DIAGNOSIS — Z7982 Long term (current) use of aspirin: Secondary | ICD-10-CM | POA: Diagnosis not present

## 2014-06-12 DIAGNOSIS — E780 Pure hypercholesterolemia: Secondary | ICD-10-CM | POA: Diagnosis not present

## 2014-06-12 DIAGNOSIS — Z8042 Family history of malignant neoplasm of prostate: Secondary | ICD-10-CM | POA: Diagnosis not present

## 2014-06-15 ENCOUNTER — Ambulatory Visit
Admission: RE | Admit: 2014-06-15 | Discharge: 2014-06-15 | Disposition: A | Discharge status: Home / Self Care | Payer: BLUE CROSS/BLUE SHIELD | Source: Ambulatory Visit | Attending: Radiation Oncology | Admitting: Radiation Oncology

## 2014-06-15 DIAGNOSIS — C61 Malignant neoplasm of prostate: Secondary | ICD-10-CM

## 2014-06-15 DIAGNOSIS — Z510 Encounter for antineoplastic radiation therapy: Secondary | ICD-10-CM | POA: Diagnosis not present

## 2014-06-15 NOTE — Progress Notes (Signed)
Complex simulation/treatment planning note: The patient was taken to the CT simulator.  He was placed supine.  A Vac lock immobilization device was constructed.  He was then catheterized and contrast instilled into his bladder/urethra.  He was then scanned.  An isocenter was chosen.  The CT data set was sent to the  MIM planning system where contoured his high risk prostate bed, CTV 66, and also his rectum and bladder.  I prescribing 6600 cGy in 33 sessions to his PTV 66 which represents his CTV +0.5 cm.  He is now ready for IMRT simulation/treatment planning.

## 2014-06-22 ENCOUNTER — Encounter: Payer: Self-pay | Admitting: Radiation Oncology

## 2014-06-22 DIAGNOSIS — Z510 Encounter for antineoplastic radiation therapy: Secondary | ICD-10-CM | POA: Diagnosis not present

## 2014-06-22 NOTE — Progress Notes (Signed)
IMRT simulation/treatment planning note: The patient completed IMRT simulation/treatment planning in the management of his carcinoma the prostate.  IMRT was chosen to decrease the risk for both acute and late bladder and rectal toxicity compared to conventional or 3-D conformal radiation therapy.  Dose volume histograms were obtained for the target structures including the prostate bed and also avoidance structures including the rectum, bladder, and femoral heads.  We met our departmental guidelines.  I prescribing 6600 cGy in 33 sessions utilizing 6 MV photons, dual ARC VMAT IMRT.

## 2014-06-23 DIAGNOSIS — Z510 Encounter for antineoplastic radiation therapy: Secondary | ICD-10-CM | POA: Diagnosis not present

## 2014-06-24 ENCOUNTER — Ambulatory Visit
Admission: RE | Admit: 2014-06-24 | Discharge: 2014-06-24 | Disposition: A | Discharge status: Home / Self Care | Payer: BLUE CROSS/BLUE SHIELD | Source: Ambulatory Visit | Attending: Radiation Oncology | Admitting: Radiation Oncology

## 2014-06-24 DIAGNOSIS — C61 Malignant neoplasm of prostate: Secondary | ICD-10-CM

## 2014-06-24 DIAGNOSIS — Z510 Encounter for antineoplastic radiation therapy: Secondary | ICD-10-CM | POA: Diagnosis not present

## 2014-06-24 NOTE — Progress Notes (Signed)
Chart note: The patient began his IMRT today in the management of his carcinoma the prostate.  He is being treated with dual ARC VMAT IMRT with 2 sets of dynamic MLCs representing one set of IMRT treatment devices (16435).

## 2014-06-25 ENCOUNTER — Ambulatory Visit
Admission: RE | Admit: 2014-06-25 | Discharge: 2014-06-25 | Disposition: A | Discharge status: Home / Self Care | Payer: BLUE CROSS/BLUE SHIELD | Source: Ambulatory Visit | Attending: Radiation Oncology | Admitting: Radiation Oncology

## 2014-06-25 DIAGNOSIS — Z510 Encounter for antineoplastic radiation therapy: Secondary | ICD-10-CM | POA: Diagnosis not present

## 2014-06-26 ENCOUNTER — Ambulatory Visit
Admission: RE | Admit: 2014-06-26 | Discharge: 2014-06-26 | Disposition: A | Discharge status: Home / Self Care | Payer: BLUE CROSS/BLUE SHIELD | Source: Ambulatory Visit | Attending: Radiation Oncology | Admitting: Radiation Oncology

## 2014-06-26 DIAGNOSIS — C61 Malignant neoplasm of prostate: Secondary | ICD-10-CM

## 2014-06-26 DIAGNOSIS — Z510 Encounter for antineoplastic radiation therapy: Secondary | ICD-10-CM | POA: Diagnosis not present

## 2014-06-26 NOTE — Progress Notes (Signed)
Education today regarding the side effect profile related to radiation to the pelvis inclusive of pain upon urination, urinary frequency, change in bowel habits, rectal irritation and fatigue.  Reviewed care in all of these areas aided by the Radiation Therapy and you booklet with the appropriate pages marked.  Advised to not apply any creams, lotions or powders in the pelvic area during his treatment phase and he stated understanding.  Informed that he will be seen by Dr. Valere Dross every Monday following his treatment.  Advised to reports any changes immediately.  Teach Back.

## 2014-06-29 ENCOUNTER — Encounter: Payer: Self-pay | Admitting: Radiation Oncology

## 2014-06-29 ENCOUNTER — Ambulatory Visit
Admission: RE | Admit: 2014-06-29 | Discharge: 2014-06-29 | Disposition: A | Discharge status: Home / Self Care | Payer: BLUE CROSS/BLUE SHIELD | Source: Ambulatory Visit | Attending: Radiation Oncology | Admitting: Radiation Oncology

## 2014-06-29 VITALS — BP 141/88 | HR 78 | Temp 98.1°F | Ht 68.0 in | Wt 211.8 lb

## 2014-06-29 DIAGNOSIS — C61 Malignant neoplasm of prostate: Secondary | ICD-10-CM

## 2014-06-29 DIAGNOSIS — Z510 Encounter for antineoplastic radiation therapy: Secondary | ICD-10-CM | POA: Diagnosis not present

## 2014-06-29 NOTE — Progress Notes (Signed)
Weekly Management Note:  Site: Prostate bed Current Dose:  800  cGy Projected Dose: 6600  cGy  Narrative: The patient is seen today for routine under treatment assessment. CBCT/MVCT images/port films were reviewed. The chart was reviewed.   Bladder filling is suboptimal today.  No GU or GI difficulty.  Physical Examination:  Filed Vitals:   06/29/14 1416  BP: 141/88  Pulse: 78  Temp: 98.1 F (36.7 C)  .  Weight: 211 lb 12.8 oz (96.072 kg).  No change.  Impression: Tolerating radiation therapy well.  I encouraged him to improve his bladder filling.  Plan: Continue radiation therapy as planned.

## 2014-06-29 NOTE — Progress Notes (Signed)
Keith Webb has received 4 fractions to his pelvis and denies any changes at this time.

## 2014-06-30 ENCOUNTER — Ambulatory Visit
Admission: RE | Admit: 2014-06-30 | Discharge: 2014-06-30 | Disposition: A | Discharge status: Home / Self Care | Payer: BLUE CROSS/BLUE SHIELD | Source: Ambulatory Visit | Attending: Radiation Oncology | Admitting: Radiation Oncology

## 2014-06-30 DIAGNOSIS — Z510 Encounter for antineoplastic radiation therapy: Secondary | ICD-10-CM | POA: Diagnosis not present

## 2014-07-01 ENCOUNTER — Ambulatory Visit
Admission: RE | Admit: 2014-07-01 | Discharge: 2014-07-01 | Disposition: A | Discharge status: Home / Self Care | Payer: BLUE CROSS/BLUE SHIELD | Source: Ambulatory Visit | Attending: Radiation Oncology | Admitting: Radiation Oncology

## 2014-07-01 DIAGNOSIS — Z510 Encounter for antineoplastic radiation therapy: Secondary | ICD-10-CM | POA: Diagnosis not present

## 2014-07-02 ENCOUNTER — Ambulatory Visit
Admission: RE | Admit: 2014-07-02 | Discharge: 2014-07-02 | Disposition: A | Discharge status: Home / Self Care | Payer: BLUE CROSS/BLUE SHIELD | Source: Ambulatory Visit | Attending: Radiation Oncology | Admitting: Radiation Oncology

## 2014-07-02 DIAGNOSIS — Z510 Encounter for antineoplastic radiation therapy: Secondary | ICD-10-CM | POA: Diagnosis not present

## 2014-07-03 ENCOUNTER — Ambulatory Visit: Payer: BLUE CROSS/BLUE SHIELD

## 2014-07-06 ENCOUNTER — Ambulatory Visit
Admission: RE | Admit: 2014-07-06 | Discharge: 2014-07-06 | Disposition: A | Discharge status: Home / Self Care | Payer: BLUE CROSS/BLUE SHIELD | Source: Ambulatory Visit | Attending: Radiation Oncology | Admitting: Radiation Oncology

## 2014-07-06 ENCOUNTER — Encounter: Payer: Self-pay | Admitting: Radiation Oncology

## 2014-07-06 VITALS — BP 157/98 | HR 70 | Temp 97.7°F | Resp 12 | Wt 209.1 lb

## 2014-07-06 DIAGNOSIS — Z510 Encounter for antineoplastic radiation therapy: Secondary | ICD-10-CM | POA: Diagnosis not present

## 2014-07-06 DIAGNOSIS — C61 Malignant neoplasm of prostate: Secondary | ICD-10-CM

## 2014-07-06 NOTE — Progress Notes (Signed)
He is currently in no pain. Pt complains of loss of sleep Pt reports urinary frequency and urgency. Pt states they urinate 1 - 2 times per night.  Pt reports soft bowel movement everyday. BP 157/98 mmHg  Pulse 70  Temp(Src) 97.7 F (36.5 C) (Oral)  Resp 12  Wt 209 lb 1.6 oz (94.847 kg)  SpO2 100%

## 2014-07-06 NOTE — Progress Notes (Signed)
Weekly Management Note:  Site: Prostate bed  Current Dose:  1600  cGy Projected Dose: 6600  cGy  Narrative: The patient is seen today for routine under treatment assessment. CBCT/MVCT images/port films were reviewed. The chart was reviewed.   Bladder filling is satisfactory.  No new GU or GI difficulties.  Physical Examination:  Filed Vitals:   07/06/14 1356  BP: 157/98  Pulse: 70  Temp: 97.7 F (36.5 C)  Resp: 12  .  Weight: 209 lb 1.6 oz (94.847 kg).  No change.  Impression: Tolerating radiation therapy well.  Plan: Continue radiation therapy as planned.

## 2014-07-07 ENCOUNTER — Ambulatory Visit
Admission: RE | Admit: 2014-07-07 | Discharge: 2014-07-07 | Disposition: A | Discharge status: Home / Self Care | Payer: BLUE CROSS/BLUE SHIELD | Source: Ambulatory Visit | Attending: Radiation Oncology | Admitting: Radiation Oncology

## 2014-07-07 DIAGNOSIS — Z510 Encounter for antineoplastic radiation therapy: Secondary | ICD-10-CM | POA: Diagnosis not present

## 2014-07-08 ENCOUNTER — Ambulatory Visit
Admission: RE | Admit: 2014-07-08 | Discharge: 2014-07-08 | Disposition: A | Discharge status: Home / Self Care | Payer: BLUE CROSS/BLUE SHIELD | Source: Ambulatory Visit | Attending: Radiation Oncology | Admitting: Radiation Oncology

## 2014-07-08 DIAGNOSIS — Z510 Encounter for antineoplastic radiation therapy: Secondary | ICD-10-CM | POA: Diagnosis not present

## 2014-07-09 ENCOUNTER — Ambulatory Visit
Admission: RE | Admit: 2014-07-09 | Discharge: 2014-07-09 | Disposition: A | Discharge status: Home / Self Care | Payer: BLUE CROSS/BLUE SHIELD | Source: Ambulatory Visit | Attending: Radiation Oncology | Admitting: Radiation Oncology

## 2014-07-09 DIAGNOSIS — Z510 Encounter for antineoplastic radiation therapy: Secondary | ICD-10-CM | POA: Diagnosis not present

## 2014-07-10 ENCOUNTER — Ambulatory Visit
Admission: RE | Admit: 2014-07-10 | Discharge: 2014-07-10 | Disposition: A | Discharge status: Home / Self Care | Payer: BLUE CROSS/BLUE SHIELD | Source: Ambulatory Visit | Attending: Radiation Oncology | Admitting: Radiation Oncology

## 2014-07-10 DIAGNOSIS — Z510 Encounter for antineoplastic radiation therapy: Secondary | ICD-10-CM | POA: Diagnosis not present

## 2014-07-13 ENCOUNTER — Ambulatory Visit
Admission: RE | Admit: 2014-07-13 | Discharge: 2014-07-13 | Disposition: A | Discharge status: Home / Self Care | Payer: BLUE CROSS/BLUE SHIELD | Source: Ambulatory Visit | Attending: Radiation Oncology | Admitting: Radiation Oncology

## 2014-07-13 ENCOUNTER — Encounter: Payer: Self-pay | Admitting: Radiation Oncology

## 2014-07-13 VITALS — BP 138/88 | HR 58 | Temp 97.5°F | Resp 12 | Wt 209.3 lb

## 2014-07-13 DIAGNOSIS — Z510 Encounter for antineoplastic radiation therapy: Secondary | ICD-10-CM | POA: Diagnosis not present

## 2014-07-13 DIAGNOSIS — C61 Malignant neoplasm of prostate: Secondary | ICD-10-CM

## 2014-07-13 NOTE — Progress Notes (Signed)
Weekly Management Note:  Site: Prostate bed Current Dose:  2600  cGy Projected Dose: 6600  cGy  Narrative: The patient is seen today for routine under treatment assessment. CBCT/MVCT images/port films were reviewed. The chart was reviewed.   Bladder filling is excellent.  No new GU or GI difficulties.  Physical Examination:  Filed Vitals:   07/13/14 1357  BP: 138/88  Pulse: 58  Temp: 97.5 F (36.4 C)  Resp: 12  .  Weight: 209 lb 4.8 oz (94.938 kg).  No change.  Impression: Tolerating radiation therapy well.  Plan: Continue radiation therapy as planned.

## 2014-07-13 NOTE — Progress Notes (Signed)
He is currently in no pain.  Pt complains of loss of sleep Pt reports urinary frequency, urgency and retention. Pt states they urinate 1 - 2 times per night.  Pt reports soft bowel movement everyday.   BP 138/88 mmHg  Pulse 58  Temp(Src) 97.5 F (36.4 C) (Oral)  Resp 12  Wt 209 lb 4.8 oz (94.938 kg)  SpO2 100%

## 2014-07-14 ENCOUNTER — Ambulatory Visit
Admission: RE | Admit: 2014-07-14 | Discharge: 2014-07-14 | Disposition: A | Discharge status: Home / Self Care | Payer: BLUE CROSS/BLUE SHIELD | Source: Ambulatory Visit | Attending: Radiation Oncology | Admitting: Radiation Oncology

## 2014-07-14 DIAGNOSIS — Z510 Encounter for antineoplastic radiation therapy: Secondary | ICD-10-CM | POA: Diagnosis not present

## 2014-07-15 ENCOUNTER — Ambulatory Visit
Admission: RE | Admit: 2014-07-15 | Discharge: 2014-07-15 | Disposition: A | Discharge status: Home / Self Care | Payer: BLUE CROSS/BLUE SHIELD | Source: Ambulatory Visit | Attending: Radiation Oncology | Admitting: Radiation Oncology

## 2014-07-15 DIAGNOSIS — Z510 Encounter for antineoplastic radiation therapy: Secondary | ICD-10-CM | POA: Diagnosis not present

## 2014-07-16 ENCOUNTER — Ambulatory Visit
Admission: RE | Admit: 2014-07-16 | Discharge: 2014-07-16 | Disposition: A | Discharge status: Home / Self Care | Payer: BLUE CROSS/BLUE SHIELD | Source: Ambulatory Visit | Attending: Radiation Oncology | Admitting: Radiation Oncology

## 2014-07-16 ENCOUNTER — Other Ambulatory Visit: Payer: Self-pay | Admitting: *Deleted

## 2014-07-16 ENCOUNTER — Ambulatory Visit: Payer: BLUE CROSS/BLUE SHIELD

## 2014-07-16 VITALS — BP 148/92 | HR 62 | Temp 98.0°F | Ht 68.0 in | Wt 207.7 lb

## 2014-07-16 DIAGNOSIS — Z510 Encounter for antineoplastic radiation therapy: Secondary | ICD-10-CM | POA: Diagnosis not present

## 2014-07-16 DIAGNOSIS — R310 Gross hematuria: Secondary | ICD-10-CM

## 2014-07-16 DIAGNOSIS — C61 Malignant neoplasm of prostate: Secondary | ICD-10-CM

## 2014-07-16 LAB — URINALYSIS, MICROSCOPIC - CHCC
Bilirubin (Urine): NEGATIVE
GLUCOSE UR CHCC: NEGATIVE mg/dL
KETONES: NEGATIVE mg/dL
LEUKOCYTE ESTERASE: NEGATIVE
Nitrite: NEGATIVE
PROTEIN: NEGATIVE mg/dL
Specific Gravity, Urine: 1.005 (ref 1.003–1.035)
Urobilinogen, UR: 0.2 mg/dL (ref 0.2–1)
pH: 6.5 (ref 4.6–8.0)

## 2014-07-16 NOTE — Progress Notes (Signed)
Keith Webb has had several episodes of Hematuria today which is new presentation.  He voided prior to this assessment and did not note any blood at this time.  He reports a "wierd, no actual pain ful" sensation when he voids.  VSS

## 2014-07-16 NOTE — Progress Notes (Signed)
See dictated note from earlier today. 

## 2014-07-16 NOTE — Progress Notes (Signed)
Clinic note: Keith Webb is seen today at a cumulative dose of 3200 cGy in 16 sessions to his prostate bed.  Beginning this morning, he noted gross hematuria.  He also describes small bright red "speckles".  He denies dysuria or pain.  His never had previous hematuria.  He is afebrile.  Impression: Gross hematuria is quite unusual from radiation therapy at this point in time.  It does not appear that he has an infection, but I think we need to rule this out.  We'll obtain a urine for urinalysis and C&S.  Plan: As above.

## 2014-07-17 ENCOUNTER — Ambulatory Visit
Admission: RE | Admit: 2014-07-17 | Discharge: 2014-07-17 | Disposition: A | Discharge status: Home / Self Care | Payer: BLUE CROSS/BLUE SHIELD | Source: Ambulatory Visit | Attending: Radiation Oncology | Admitting: Radiation Oncology

## 2014-07-17 DIAGNOSIS — Z510 Encounter for antineoplastic radiation therapy: Secondary | ICD-10-CM | POA: Diagnosis not present

## 2014-07-18 LAB — URINE CULTURE

## 2014-07-20 ENCOUNTER — Encounter: Payer: Self-pay | Admitting: Radiation Oncology

## 2014-07-20 ENCOUNTER — Ambulatory Visit
Admission: RE | Admit: 2014-07-20 | Discharge: 2014-07-20 | Disposition: A | Discharge status: Home / Self Care | Payer: BLUE CROSS/BLUE SHIELD | Source: Ambulatory Visit | Attending: Radiation Oncology | Admitting: Radiation Oncology

## 2014-07-20 VITALS — BP 157/102 | HR 69 | Temp 98.0°F | Resp 12 | Wt 209.0 lb

## 2014-07-20 DIAGNOSIS — C61 Malignant neoplasm of prostate: Secondary | ICD-10-CM

## 2014-07-20 DIAGNOSIS — Z510 Encounter for antineoplastic radiation therapy: Secondary | ICD-10-CM | POA: Diagnosis not present

## 2014-07-20 NOTE — Progress Notes (Signed)
He is currently in no pain. Pt complains of loss of sleep Pt reports urinary frequency, urgency and flank pain-reports it goes away on its own, denies pain with urination. Pt states they urinate 1 - 2 times per night.  Pt reports soft bowel movement everyday.  Reports "bloated/gas" stomach.  BP 157/102 mmHg  Pulse 69  Temp(Src) 98 F (36.7 C) (Oral)  Resp 12  Wt 209 lb (94.802 kg)  SpO2 100%

## 2014-07-20 NOTE — Progress Notes (Signed)
CC: Dr. Lowella Bandy   Weekly Management Note:  Site: Prostate bed Current Dose:  3600  cGy Projected Dose: 6600  cGy  Narrative: The patient is seen today for routine under treatment assessment. CBCT/MVCT images/port films were reviewed. The chart was reviewed.   Bladder filling is excellent.  As mentioned last week, he had 1 episode of gross hematuria but has not had any further bleeding.  Urinalysis only showed blood cells with no evidence for infection.  No history of kidney stones.  He does have some "aching" along both flanks was generally occurs in the morning and goes away during the day.    Physical Examination:  Filed Vitals:   07/20/14 1351  BP: 157/102  Pulse: 69  Temp: 98 F (36.7 C)  Resp: 12  .  Weight: 209 lb (94.802 kg).  No change.  Impression: Tolerating radiation therapy well.  He will be monitored for any further hematuria.  Plan: Continue radiation therapy as planned.  I'm not sure if his single episode of gross hematuria monitors further workup, and I will copy Dr. Janice Norrie for him to decide.

## 2014-07-21 ENCOUNTER — Ambulatory Visit
Admission: RE | Admit: 2014-07-21 | Discharge: 2014-07-21 | Disposition: A | Discharge status: Home / Self Care | Payer: BLUE CROSS/BLUE SHIELD | Source: Ambulatory Visit | Attending: Radiation Oncology | Admitting: Radiation Oncology

## 2014-07-21 DIAGNOSIS — Z510 Encounter for antineoplastic radiation therapy: Secondary | ICD-10-CM | POA: Diagnosis not present

## 2014-07-22 ENCOUNTER — Ambulatory Visit
Admission: RE | Admit: 2014-07-22 | Discharge: 2014-07-22 | Disposition: A | Discharge status: Home / Self Care | Payer: BLUE CROSS/BLUE SHIELD | Source: Ambulatory Visit | Attending: Radiation Oncology | Admitting: Radiation Oncology

## 2014-07-22 DIAGNOSIS — Z510 Encounter for antineoplastic radiation therapy: Secondary | ICD-10-CM | POA: Diagnosis not present

## 2014-07-23 ENCOUNTER — Ambulatory Visit
Admission: RE | Admit: 2014-07-23 | Discharge: 2014-07-23 | Disposition: A | Discharge status: Home / Self Care | Payer: BLUE CROSS/BLUE SHIELD | Source: Ambulatory Visit | Attending: Radiation Oncology | Admitting: Radiation Oncology

## 2014-07-23 DIAGNOSIS — Z510 Encounter for antineoplastic radiation therapy: Secondary | ICD-10-CM | POA: Diagnosis not present

## 2014-07-24 ENCOUNTER — Ambulatory Visit
Admission: RE | Admit: 2014-07-24 | Discharge: 2014-07-24 | Disposition: A | Discharge status: Home / Self Care | Payer: BLUE CROSS/BLUE SHIELD | Source: Ambulatory Visit | Attending: Radiation Oncology | Admitting: Radiation Oncology

## 2014-07-24 DIAGNOSIS — Z510 Encounter for antineoplastic radiation therapy: Secondary | ICD-10-CM | POA: Diagnosis not present

## 2014-07-27 ENCOUNTER — Ambulatory Visit
Admission: RE | Admit: 2014-07-27 | Discharge: 2014-07-27 | Disposition: A | Discharge status: Home / Self Care | Payer: BLUE CROSS/BLUE SHIELD | Source: Ambulatory Visit | Attending: Radiation Oncology | Admitting: Radiation Oncology

## 2014-07-27 ENCOUNTER — Encounter: Payer: Self-pay | Admitting: Radiation Oncology

## 2014-07-27 VITALS — BP 157/97 | HR 65 | Temp 97.8°F | Resp 12 | Wt 206.0 lb

## 2014-07-27 DIAGNOSIS — C61 Malignant neoplasm of prostate: Secondary | ICD-10-CM

## 2014-07-27 DIAGNOSIS — Z510 Encounter for antineoplastic radiation therapy: Secondary | ICD-10-CM | POA: Diagnosis not present

## 2014-07-27 NOTE — Progress Notes (Signed)
Weekly Management Note:  Site: Prostate bed Current Dose:  4600  cGy Projected Dose: 6600  cGy  Narrative: The patient is seen today for routine under treatment assessment. CBCT/MVCT images/port films were reviewed. The chart was reviewed.   Bladder filling is satisfactory.  No new GU or GI difficulties.  He is not had any further hematuria.  Physical Examination:  Filed Vitals:   07/27/14 1424  BP: 157/97  Pulse: 65  Temp: 97.8 F (36.6 C)  Resp: 12  .  Weight: 206 lb (93.441 kg).  No change.  Impression: Tolerating radiation therapy well.  Plan: Continue radiation therapy as planned.

## 2014-07-27 NOTE — Progress Notes (Signed)
He is currently in no pain.  Pt complains of fatigue and poor appetite. Pt reports urinary frequency, urgency and hot flashes. Pt states they urinate 1 - 2 times per night.  Pt reports Nausea, Vomiting on Friday 07/24/14. Then stared having Diarrhea on Sunday 07/26/14. Recommended having Imodium on hand, baby wipes, moistened toilet paper and spray bottle for rectal care.  BP 157/97 mmHg  Pulse 65  Temp(Src) 97.8 F (36.6 C) (Oral)  Resp 12  Wt 206 lb (93.441 kg)  SpO2 100%

## 2014-07-28 ENCOUNTER — Ambulatory Visit
Admission: RE | Admit: 2014-07-28 | Discharge: 2014-07-28 | Disposition: A | Discharge status: Home / Self Care | Payer: BLUE CROSS/BLUE SHIELD | Source: Ambulatory Visit | Attending: Radiation Oncology | Admitting: Radiation Oncology

## 2014-07-28 DIAGNOSIS — Z510 Encounter for antineoplastic radiation therapy: Secondary | ICD-10-CM | POA: Diagnosis not present

## 2014-07-29 ENCOUNTER — Ambulatory Visit
Admission: RE | Admit: 2014-07-29 | Discharge: 2014-07-29 | Disposition: A | Discharge status: Home / Self Care | Payer: BLUE CROSS/BLUE SHIELD | Source: Ambulatory Visit | Attending: Radiation Oncology | Admitting: Radiation Oncology

## 2014-07-29 DIAGNOSIS — Z510 Encounter for antineoplastic radiation therapy: Secondary | ICD-10-CM | POA: Diagnosis not present

## 2014-07-30 ENCOUNTER — Ambulatory Visit
Admission: RE | Admit: 2014-07-30 | Discharge: 2014-07-30 | Disposition: A | Discharge status: Home / Self Care | Payer: BLUE CROSS/BLUE SHIELD | Source: Ambulatory Visit | Attending: Radiation Oncology | Admitting: Radiation Oncology

## 2014-07-30 DIAGNOSIS — Z510 Encounter for antineoplastic radiation therapy: Secondary | ICD-10-CM | POA: Diagnosis not present

## 2014-07-31 ENCOUNTER — Ambulatory Visit
Admission: RE | Admit: 2014-07-31 | Discharge: 2014-07-31 | Disposition: A | Discharge status: Home / Self Care | Payer: BLUE CROSS/BLUE SHIELD | Source: Ambulatory Visit | Attending: Radiation Oncology | Admitting: Radiation Oncology

## 2014-07-31 DIAGNOSIS — Z510 Encounter for antineoplastic radiation therapy: Secondary | ICD-10-CM | POA: Diagnosis not present

## 2014-08-03 ENCOUNTER — Ambulatory Visit
Admission: RE | Admit: 2014-08-03 | Discharge: 2014-08-03 | Disposition: A | Discharge status: Home / Self Care | Payer: BLUE CROSS/BLUE SHIELD | Source: Ambulatory Visit | Attending: Radiation Oncology | Admitting: Radiation Oncology

## 2014-08-03 ENCOUNTER — Encounter: Payer: Self-pay | Admitting: Radiation Oncology

## 2014-08-03 VITALS — BP 144/100 | HR 69 | Temp 97.4°F | Ht 68.0 in | Wt 206.3 lb

## 2014-08-03 DIAGNOSIS — C61 Malignant neoplasm of prostate: Secondary | ICD-10-CM

## 2014-08-03 DIAGNOSIS — Z510 Encounter for antineoplastic radiation therapy: Secondary | ICD-10-CM | POA: Diagnosis not present

## 2014-08-03 NOTE — Progress Notes (Signed)
Keith Webb has received 28 fractions to his pelvis for prostate cancer.  He denies any dysuria, hesitancy, proctitis, loose stools or diarrhea, but he reports urgency and voids at least twice nightly.

## 2014-08-03 NOTE — Progress Notes (Signed)
Weekly Management Note:  Site: Prostate bed Current Dose:  5600  cGy Projected Dose: 6600  cGy  Narrative: The patient is seen today for routine under treatment assessment. CBCT/MVCT images/port films were reviewed. The chart was reviewed.   Bladder filling is excellent.  No new GU or GI difficulties.  Physical Examination:  Filed Vitals:   08/03/14 1329  BP: 144/100  Pulse: 69  Temp: 97.4 F (36.3 C)  .  Weight: 206 lb 4.8 oz (93.577 kg).  No change.  Impression: Tolerating radiation therapy well.  Plan: Continue radiation therapy as planned.

## 2014-08-04 ENCOUNTER — Ambulatory Visit
Admission: RE | Admit: 2014-08-04 | Discharge: 2014-08-04 | Disposition: A | Discharge status: Home / Self Care | Payer: BLUE CROSS/BLUE SHIELD | Source: Ambulatory Visit | Attending: Radiation Oncology | Admitting: Radiation Oncology

## 2014-08-04 DIAGNOSIS — Z510 Encounter for antineoplastic radiation therapy: Secondary | ICD-10-CM | POA: Diagnosis not present

## 2014-08-05 ENCOUNTER — Ambulatory Visit
Admission: RE | Admit: 2014-08-05 | Discharge: 2014-08-05 | Disposition: A | Discharge status: Home / Self Care | Payer: BLUE CROSS/BLUE SHIELD | Source: Ambulatory Visit | Attending: Radiation Oncology | Admitting: Radiation Oncology

## 2014-08-05 DIAGNOSIS — Z510 Encounter for antineoplastic radiation therapy: Secondary | ICD-10-CM | POA: Diagnosis not present

## 2014-08-06 ENCOUNTER — Ambulatory Visit
Admission: RE | Admit: 2014-08-06 | Discharge: 2014-08-06 | Disposition: A | Discharge status: Home / Self Care | Payer: BLUE CROSS/BLUE SHIELD | Source: Ambulatory Visit | Attending: Radiation Oncology | Admitting: Radiation Oncology

## 2014-08-06 DIAGNOSIS — Z510 Encounter for antineoplastic radiation therapy: Secondary | ICD-10-CM | POA: Diagnosis not present

## 2014-08-07 ENCOUNTER — Ambulatory Visit
Admission: RE | Admit: 2014-08-07 | Discharge: 2014-08-07 | Disposition: A | Discharge status: Home / Self Care | Payer: BLUE CROSS/BLUE SHIELD | Source: Ambulatory Visit | Attending: Radiation Oncology | Admitting: Radiation Oncology

## 2014-08-07 ENCOUNTER — Ambulatory Visit: Payer: BLUE CROSS/BLUE SHIELD

## 2014-08-07 DIAGNOSIS — Z510 Encounter for antineoplastic radiation therapy: Secondary | ICD-10-CM | POA: Diagnosis not present

## 2014-08-10 ENCOUNTER — Encounter: Payer: Self-pay | Admitting: Radiation Oncology

## 2014-08-10 ENCOUNTER — Ambulatory Visit: Payer: BLUE CROSS/BLUE SHIELD

## 2014-08-10 ENCOUNTER — Ambulatory Visit
Admission: RE | Admit: 2014-08-10 | Discharge: 2014-08-10 | Disposition: A | Discharge status: Home / Self Care | Payer: BLUE CROSS/BLUE SHIELD | Source: Ambulatory Visit | Attending: Radiation Oncology | Admitting: Radiation Oncology

## 2014-08-10 VITALS — BP 121/91 | HR 70 | Temp 97.9°F | Resp 20 | Wt 207.7 lb

## 2014-08-10 DIAGNOSIS — C61 Malignant neoplasm of prostate: Secondary | ICD-10-CM

## 2014-08-10 DIAGNOSIS — Z510 Encounter for antineoplastic radiation therapy: Secondary | ICD-10-CM | POA: Diagnosis not present

## 2014-08-10 NOTE — Progress Notes (Signed)
[facility] End of Treatment Note  Name:Keith Webb  Date: 08/10/2014 FSF:[id number] DOB:Dec 05, 1950   Status:outpatient    CC: Elyn Peers, MD  Dr. Lowella Bandy  REFERRING PHYSICIAN:  Dr. Lowella Bandy   DIAGNOSIS:  PSA recurrent carcinoma the prostate  INDICATION FOR TREATMENT: Curative   TREATMENT DATES: 06/24/2014 through 08/10/2014                          SITE/DOSE:  Prostate bed 6600 cGy in 33 sessions                          BEAMS/ENERGY: Dual ARC VMAT IMRT with 6 MV photons                  NARRATIVE:     Mr. Hackbart tolerated his treatment well although he did have one episode of gross hematuria midway during his course of therapy.  Etiology was unclear.  I defer to Dr. Janice Norrie for workup if clinically indicated.                       PLAN: Routine followup in one month. Patient instructed to call if questions or worsening complaints in interim.

## 2014-08-10 NOTE — Progress Notes (Signed)
Weekly rad txs prostate 33/33 completed, regular bowel movements, slight urgency voiding, no dysuria, hematuria, good stream voiding ,only gets up at night if he drinks water late, stated, good appetite, no fatigue stated, 1 month f/u appt card given  1:33 PM

## 2014-08-10 NOTE — Progress Notes (Signed)
Weekly Management Note:  Site: Prostate bed Current Dose:  6600  cGy Projected Dose: 6600  cGy  Narrative: The patient is seen today for routine under treatment assessment. CBCT/MVCT images/port films were reviewed. The chart was reviewed.   Bladder filling less than ideal today.  No new GU or GI difficulties.  Radiation therapy completed today.  Physical Examination:  Filed Vitals:   08/10/14 1331  BP: 121/91  Pulse: 70  Temp: 97.9 F (36.6 C)  Resp: 20  .  Weight: 207 lb 11.2 oz (94.212 kg).  No change.  Impression: Radiation therapy completed  Plan: Follow-up visit in one month.

## 2014-09-09 ENCOUNTER — Encounter: Payer: Self-pay | Admitting: Radiation Oncology

## 2014-09-15 ENCOUNTER — Ambulatory Visit
Admission: RE | Admit: 2014-09-15 | Discharge: 2014-09-15 | Disposition: A | Discharge status: Home / Self Care | Payer: BLUE CROSS/BLUE SHIELD | Source: Ambulatory Visit | Attending: Radiation Oncology | Admitting: Radiation Oncology

## 2014-09-15 ENCOUNTER — Encounter: Payer: Self-pay | Admitting: Radiation Oncology

## 2014-09-15 VITALS — BP 131/78 | HR 82 | Temp 98.4°F | Ht 68.0 in | Wt 206.2 lb

## 2014-09-15 DIAGNOSIS — C61 Malignant neoplasm of prostate: Secondary | ICD-10-CM

## 2014-09-15 HISTORY — DX: Personal history of irradiation: Z92.3

## 2014-09-15 NOTE — Progress Notes (Signed)
CC: Dr. Lowella Bandy  Follow-up note:  Keith Webb returns today approximately 1 month following completion of radiation therapy in the management of his PSA recurrent carcinoma the prostate.  He is still doing well from a GU and GI standpoint except for a "funny sensation when urinating".  He describes this as intermittent "irritation".  He had one episode of hematuria during his course of radiation therapy and he has had a couple minor episodes soon after radiation therapy but none for the past couple of weeks.  He will see Keith Webb for a follow-up visit next month.  Physical examination: Alert and oriented. Filed Vitals:   09/15/14 1123  BP: 131/78  Pulse: 82  Temp: 98.4 F (36.9 C)   Rectal examination not performed today.  Impression: Satisfactory progress except for a few more brief episodes of gross hematuria.  Etiology is unclear but could be secondary to radiation cystitis.  I defer to Keith Webb for further evaluation.  He'll see Keith Webb for a follow-up visit next month.  He will also have a PSA determination at that time.  Plan: As above.  I've not scheduled Mr. Knisley for a formal follow-up visit and I ask that Keith Webb keep me posted on his progress.

## 2014-09-15 NOTE — Progress Notes (Signed)
Keith Webb here for reassessment S/P radiation for prostate cancer.  He reports that he has a "funny sensation when urinating".  He reports "good" urine flow without any straining or hesitancy.  Nocturia x 2.  He states that post radiation in had 3 episodes of hematuria, but none in the past 3 weeks.  He admits to " a little bit of fatigue since the end of radiation therapy.

## 2014-10-02 ENCOUNTER — Ambulatory Visit (INDEPENDENT_AMBULATORY_CARE_PROVIDER_SITE_OTHER): Payer: BLUE CROSS/BLUE SHIELD | Admitting: Family Medicine

## 2014-10-02 VITALS — BP 116/82 | HR 77 | Temp 98.0°F | Resp 16 | Ht 67.0 in | Wt 200.0 lb

## 2014-10-02 DIAGNOSIS — J209 Acute bronchitis, unspecified: Secondary | ICD-10-CM | POA: Diagnosis not present

## 2014-10-02 DIAGNOSIS — R0989 Other specified symptoms and signs involving the circulatory and respiratory systems: Secondary | ICD-10-CM | POA: Diagnosis not present

## 2014-10-02 DIAGNOSIS — R05 Cough: Secondary | ICD-10-CM | POA: Diagnosis not present

## 2014-10-02 DIAGNOSIS — R062 Wheezing: Secondary | ICD-10-CM

## 2014-10-02 MED ORDER — PREDNISONE 20 MG PO TABS: ORAL_TABLET | ORAL | Status: DC

## 2014-10-02 MED ORDER — ALBUTEROL SULFATE HFA 108 (90 BASE) MCG/ACT IN AERS: 2.0000 | INHALATION_SPRAY | Freq: Four times a day (QID) | RESPIRATORY_TRACT | Status: DC | PRN

## 2014-10-02 MED ORDER — ALBUTEROL SULFATE (2.5 MG/3ML) 0.083% IN NEBU
2.5000 mg | INHALATION_SOLUTION | Freq: Once | RESPIRATORY_TRACT | Status: AC
  Administered 2014-10-02: 2.5 mg via RESPIRATORY_TRACT

## 2014-10-02 MED ORDER — DOXYCYCLINE HYCLATE 100 MG PO CAPS: 100.0000 mg | ORAL_CAPSULE | Freq: Two times a day (BID) | ORAL | Status: DC

## 2014-10-02 NOTE — Progress Notes (Signed)
[facility] [address] [place] [address] [phone]  Date:  10/02/2014   Name:  Keith Webb   DOB:  [birth year]   MRN:  [id number]  PCP:  Elyn Peers, MD    Chief Complaint: Cough; Chills; and Nasal Congestion   History of Present Illness:  Keith Webb is a 64 y.o. very pleasant male patient who presents with the following:  Here today with chest congestion, cold and cough. He notes cough. He has been ill for over a week.   The cough can be productive.   He just finished his radiation at the end of February- he is not on any chemo for prostate cancer.   He may notice sweats at night, but OW has not noted any fever. No GI symptoms.   He has noted body aches and chills at the start of the illness.   He also has some sinus congestion.   He feels like the cough is getting worse, he is bringing up a lot of material.   He has not been aware of any wheezing No issues with glaucoma He does not have DM  Patient Active Problem List   Diagnosis Date Noted  . Malignant neoplasm of prostate 05/26/2014    Past Medical History  Diagnosis Date  . Hypercholesterolemia   . Hypertension   . Sleep apnea   . Prostate cancer 1998    gleason 7, PSA 18.6  . S/P radiation therapy 06/24/2014 through 08/10/2014     Prostate bed 6600 cGy in 33 sessions     Past Surgical History  Procedure Laterality Date  . Back surgery    . Elbow surgery    . Hip surgery    . Wrist carpectomy Bilateral   . Prostatectomy  1998    gleason 7    History  Substance Use Topics  . Smoking status: Former Smoker    Types: Cigars    Quit date: 05/22/2004  . Smokeless tobacco: Not on file  . Alcohol Use: No    Family History  Problem Relation Age of Onset  . Stroke Mother   . Diabetes Sister   . Cancer Brother     prostate, surgery    No Known Allergies  Medication list has been reviewed and  updated.  Current Outpatient Prescriptions on File Prior to Visit  Medication Sig Dispense Refill  . amLODipine (NORVASC) 10 MG tablet Take 10 mg by mouth daily.    Marland Kitchen amoxicillin (AMOXIL) 500 MG capsule Take 500 mg by mouth 3 (three) times daily.    Marland Kitchen aspirin 325 MG tablet Take 325 mg by mouth every other day.     . cyclobenzaprine (FLEXERIL) 10 MG tablet Take 10 mg by mouth 3 (three) times daily as needed for muscle spasms.    . Dexlansoprazole (DEXILANT PO) Take by mouth.    . Diclofenac (ZORVOLEX) 35 MG CAPS Take by mouth.    . ezetimibe-simvastatin (VYTORIN) 10-40 MG per tablet Take 1 tablet by mouth daily.    . furosemide (LASIX) 40 MG tablet Take 40 mg by mouth.    . guanFACINE (INTUNIV) 1 MG TB24 Take 1 mg by mouth daily.    Marland Kitchen losartan (COZAAR) 100 MG tablet Take 100 mg by mouth daily.    . meloxicam (MOBIC) 15 MG tablet Take 15 mg by mouth daily.    . montelukast (SINGULAIR) 10 MG tablet Take 10 mg by mouth at bedtime.    Marland Kitchen  niacin (NIASPAN) 1000 MG CR tablet Take 1,000 mg by mouth at bedtime.    . sildenafil (VIAGRA) 100 MG tablet Take 100 mg by mouth daily as needed for erectile dysfunction.     No current facility-administered medications on file prior to visit.    Review of Systems:  As per HPI- otherwise negative.   Physical Examination: Filed Vitals:   10/02/14 1308  BP: 116/82  Pulse: 77  Temp: 98 F (36.7 C)  Resp: 16   Filed Vitals:   10/02/14 1308  Height: 5\' 7"  (1.702 m)  Weight: 200 lb (90.719 kg)   Body mass index is 31.32 kg/(m^2). Ideal Body Weight: Weight in (lb) to have BMI = 25: 159.3  GEN: WDWN, NAD, Non-toxic, A & O x 3, overweight, looks well HEENT: Atraumatic, Normocephalic. Neck supple. No masses, No LAD.  Bilateral TM wnl, oropharynx normal.  PEERL,EOMI.   Ears and Nose: No external deformity. CV: RRR, No M/G/R. No JVD. No thrill. No extra heart sounds. PULM: CTA B, no crackles, rhonchi. No retractions. No resp. distress. No accessory  muscle use.  Bilateral mild wheezing ABD: S, NT, ND EXTR: No c/c/e NEURO Normal gait.  PSYCH: Normally interactive. Conversant. Not depressed or anxious appearing.  Calm demeanor.  He is wheezing bilaterally  Albuterol neb: this did not help his wheezing much  Assessment and Plan: Acute bronchitis, unspecified organism - Plan: doxycycline (VIBRAMYCIN) 100 MG capsule  Wheezing - Plan: albuterol (PROVENTIL HFA;VENTOLIN HFA) 108 (90 BASE) MCG/ACT inhaler, albuterol (PROVENTIL) (2.5 MG/3ML) 0.083% nebulizer solution 2.5 mg, predniSONE (DELTASONE) 20 MG tablet  Chest congestion  He has used prednisone in the past and did ok except "it makes me eat."  Will treat for bronchitis with doxycycline, a short course of prednisone and did give an rx for albuterol.  However if the albuterol is expensive he does not need to fill it as it did not help him too much.    He will let me know if not feeling better in the next couple of days- Sooner if worse.     Signed Lamar Blinks, MD

## 2014-10-02 NOTE — Patient Instructions (Signed)
We are going to treat you for bronchitis and wheezing with doxycycline (antibiotic), albuterol (for wheezing) and prednisone (also for wheezing). Please let me know if you do not feel better in the next couple of days- Sooner if worse.   While you are on the prednisone please do not take your diclofenac

## 2015-08-06 ENCOUNTER — Other Ambulatory Visit: Payer: Self-pay | Admitting: Neurological Surgery

## 2015-09-03 ENCOUNTER — Ambulatory Visit (HOSPITAL_COMMUNITY)
Admission: RE | Admit: 2015-09-03 | Discharge: 2015-09-03 | Disposition: A | Discharge status: Home / Self Care | Payer: BLUE CROSS/BLUE SHIELD | Source: Ambulatory Visit | Attending: Neurological Surgery | Admitting: Neurological Surgery

## 2015-09-03 ENCOUNTER — Encounter (HOSPITAL_COMMUNITY)
Admission: RE | Admit: 2015-09-03 | Discharge: 2015-09-03 | Disposition: A | Discharge status: Home / Self Care | Payer: BLUE CROSS/BLUE SHIELD | Source: Ambulatory Visit | Attending: Neurological Surgery | Admitting: Neurological Surgery

## 2015-09-03 ENCOUNTER — Encounter (HOSPITAL_COMMUNITY): Payer: Self-pay

## 2015-09-03 DIAGNOSIS — E785 Hyperlipidemia, unspecified: Secondary | ICD-10-CM | POA: Diagnosis not present

## 2015-09-03 DIAGNOSIS — Z01812 Encounter for preprocedural laboratory examination: Secondary | ICD-10-CM | POA: Diagnosis not present

## 2015-09-03 DIAGNOSIS — Z79899 Other long term (current) drug therapy: Secondary | ICD-10-CM | POA: Insufficient documentation

## 2015-09-03 DIAGNOSIS — I498 Other specified cardiac arrhythmias: Secondary | ICD-10-CM | POA: Diagnosis not present

## 2015-09-03 DIAGNOSIS — M4806 Spinal stenosis, lumbar region: Secondary | ICD-10-CM | POA: Diagnosis not present

## 2015-09-03 DIAGNOSIS — Z0183 Encounter for blood typing: Secondary | ICD-10-CM | POA: Diagnosis not present

## 2015-09-03 DIAGNOSIS — K219 Gastro-esophageal reflux disease without esophagitis: Secondary | ICD-10-CM | POA: Diagnosis not present

## 2015-09-03 DIAGNOSIS — Z87891 Personal history of nicotine dependence: Secondary | ICD-10-CM | POA: Diagnosis not present

## 2015-09-03 DIAGNOSIS — M48061 Spinal stenosis, lumbar region without neurogenic claudication: Secondary | ICD-10-CM

## 2015-09-03 DIAGNOSIS — I10 Essential (primary) hypertension: Secondary | ICD-10-CM | POA: Insufficient documentation

## 2015-09-03 DIAGNOSIS — Z01818 Encounter for other preprocedural examination: Secondary | ICD-10-CM | POA: Insufficient documentation

## 2015-09-03 DIAGNOSIS — Z7982 Long term (current) use of aspirin: Secondary | ICD-10-CM | POA: Diagnosis not present

## 2015-09-03 DIAGNOSIS — Z8546 Personal history of malignant neoplasm of prostate: Secondary | ICD-10-CM | POA: Insufficient documentation

## 2015-09-03 DIAGNOSIS — G4733 Obstructive sleep apnea (adult) (pediatric): Secondary | ICD-10-CM | POA: Diagnosis not present

## 2015-09-03 HISTORY — DX: Gastro-esophageal reflux disease without esophagitis: K21.9

## 2015-09-03 HISTORY — DX: Cardiac murmur, unspecified: R01.1

## 2015-09-03 HISTORY — DX: Unspecified osteoarthritis, unspecified site: M19.90

## 2015-09-03 LAB — BASIC METABOLIC PANEL
ANION GAP: 10 (ref 5–15)
BUN: 20 mg/dL (ref 6–20)
CO2: 22 mmol/L (ref 22–32)
Calcium: 9.6 mg/dL (ref 8.9–10.3)
Chloride: 106 mmol/L (ref 101–111)
Creatinine, Ser: 1.18 mg/dL (ref 0.61–1.24)
GFR calc Af Amer: >60 mL/min (ref >60)
GLUCOSE: 90 mg/dL (ref 65–99)
POTASSIUM: 3.9 mmol/L (ref 3.5–5.1)
Sodium: 138 mmol/L (ref 135–145)

## 2015-09-03 LAB — SURGICAL PCR SCREEN
MRSA, PCR: NEGATIVE
STAPHYLOCOCCUS AUREUS: NEGATIVE

## 2015-09-03 LAB — TYPE AND SCREEN
ABO/RH(D): O POS
Antibody Screen: NEGATIVE

## 2015-09-03 LAB — CBC WITH DIFFERENTIAL/PLATELET
BASOS ABS: 0 10*3/uL (ref 0.0–0.1)
BASOS PCT: 1 %
Eosinophils Absolute: 0.1 10*3/uL (ref 0.0–0.7)
Eosinophils Relative: 2 %
HEMATOCRIT: 37 % — AB (ref 39.0–52.0)
HEMOGLOBIN: 12.6 g/dL — AB (ref 13.0–17.0)
LYMPHS PCT: 24 %
Lymphs Abs: 1.3 10*3/uL (ref 0.7–4.0)
MCH: 31.5 pg (ref 26.0–34.0)
MCHC: 34.1 g/dL (ref 30.0–36.0)
MCV: 92.5 fL (ref 78.0–100.0)
MONOS PCT: 10 %
Monocytes Absolute: 0.5 10*3/uL (ref 0.1–1.0)
NEUTROS ABS: 3.5 10*3/uL (ref 1.7–7.7)
Neutrophils Relative %: 63 %
Platelets: 231 10*3/uL (ref 150–400)
RBC: 4 MIL/uL — AB (ref 4.22–5.81)
RDW: 13.8 % (ref 11.5–15.5)
WBC: 5.5 10*3/uL (ref 4.0–10.5)

## 2015-09-03 LAB — PROTIME-INR
INR: 1.03 (ref 0.00–1.49)
Prothrombin Time: 13.7 seconds (ref 11.6–15.2)

## 2015-09-03 NOTE — Progress Notes (Signed)
PCP is Dr. Lucianne Lei Pt states he saw a cardiologist, but doesn't remember the Drs name. Dr Fransico Setters office called to see who he was referred to. Pt states he had a stress test and an echo last year. Pt states he no longer wears his CPAP

## 2015-09-03 NOTE — Pre-Procedure Instructions (Signed)
Keith Webb  09/03/2015      [id number] - , [place] - [address] AT [place] [address] Phone: [phone] Fax: [phone]    Your procedure is scheduled on March 31  Report to [facility] at 1115 A.M.  Call this number if you have problems the morning of surgery:  [phone]   Remember:  Do not eat food or drink liquids after midnight.  Take these medicines the morning of surgery with A SIP OF WATER albuterol inhaler-bring with you on the day of surgery, amlodipine (Norvasc), dexlansoprazole (Decilant)  Stop taking Diclofenac, Meloxicam (Mobic), Aspirin, Ibuprofen, Advil, Motrin, Aleve, BC's, Goody's, Herbal medications, Fish Oil,   Do not wear jewelry, make-up or nail polish.  Do not wear lotions, powders, or perfumes.  You may wear deodorant.  Do not shave 48 hours prior to surgery.  Men may shave face and neck.  Do not bring valuables to the hospital.  [facility] is not responsible for any belongings or valuables.  Contacts, dentures or bridgework may not be worn into surgery.  Leave your suitcase in the car.  After surgery it may be brought to your room.  For patients admitted to the hospital, discharge time will be determined by your treatment team.  Patients discharged the day of surgery will not be allowed to drive home.    Special instructions: [facility] - Preparing for Surgery  Before surgery, you can play an important role.  Because skin is not sterile, your skin needs to be as free of germs as possible.  You can reduce the number of germs on you skin by washing with CHG (chlorahexidine gluconate) soap before surgery.  CHG is an antiseptic cleaner which kills germs and bonds with the skin to continue killing germs even after washing.  Please DO NOT use if you have an allergy to CHG or antibacterial soaps.  If your skin becomes reddened/irritated stop  using the CHG and inform your nurse when you arrive at Short Stay.  Do not shave (including legs and underarms) for at least 48 hours prior to the first CHG shower.  You may shave your face.  Please follow these instructions carefully:   1.  Shower with CHG Soap the night before surgery and the                                morning of Surgery.  2.  If you choose to wash your hair, wash your hair first as usual with your       normal shampoo.  3.  After you shampoo, rinse your hair and body thoroughly to remove the                      Shampoo.  4.  Use CHG as you would any other liquid soap.  You can apply chg directly       to the skin and wash gently with scrungie or a clean washcloth.  5.  Apply the CHG Soap to your body ONLY FROM THE NECK DOWN.        Do not use on open wounds or open sores.  Avoid contact with your eyes,       ears, mouth and genitals (private parts).  Wash genitals (private parts)  with your normal soap.  6.  Wash thoroughly, paying special attention to the area where your surgery        will be performed.  7.  Thoroughly rinse your body with warm water from the neck down.  8.  DO NOT shower/wash with your normal soap after using and rinsing off       the CHG Soap.  9.  Pat yourself dry with a clean towel.            10.  Wear clean pajamas.            11.  Place clean sheets on your bed the night of your first shower and do not        sleep with pets.  Day of Surgery  Do not apply any lotions/deoderants the morning of surgery.  Please wear clean clothes to the hospital/surgery center.     Please read over the following fact sheets that you were given. Pain Booklet, Coughing and Deep Breathing, Blood Transfusion Information, Total Joint Packet and MRSA Information

## 2015-09-06 ENCOUNTER — Other Ambulatory Visit (HOSPITAL_COMMUNITY): Payer: BLUE CROSS/BLUE SHIELD

## 2015-09-06 NOTE — Progress Notes (Signed)
Anesthesia Chart Review:  Pt is a 65 year old male scheduled for L4-5 decompressive laminectomy, L3-4, L4-5 posterior lateral fusion, removal of biomet screws L5-S1, coritcal pedicle screws L3-5 on 09/10/2015 with Dr. Ronnald Ramp.   PCP is Dr. Lucianne Lei. Cardiologist is Dr. Adrian Prows.   PMH includes:  HTN, hyperlipidemia, heart murmur, OSA, prostate cancer, GERD. Former smoker. BMI 31.   Medications include: albuterol, amlodipine, ASA, dexilant, vytorin, lasix, guanfacine, iron, losartan, viagra  Preoperative labs reviewed.    Chest x-ray 09/03/15 reviewed. No acute cardiopulmonary disease  EKG 09/03/15: Sinus rhythm with marked sinus arrhythmia. Nonspecific T wave abnormality  Echo 03/23/15:  1. LV cavity is normal in size. Mild concentric LVH. Normal global wall motion. Normal diastolic filling pattern. EF 57% 2. Mild to moderate aortic regurgitation. No significant change from prior study 04/07/14.   Exercise stress test 04/13/14:  - Negative for ischemia.  - Marked increase in exercise tolerance  If no changes, I anticipate pt can proceed with surgery as scheduled.   Willeen Cass, FNP-BC [facility]/Anesthesiology Phone: [phone] 09/06/2015 3:55 PM

## 2015-09-09 NOTE — Anesthesia Preprocedure Evaluation (Addendum)
Anesthesia Evaluation  Patient identified by MRN, date of birth, ID band Patient awake    Reviewed: Allergy & Precautions, NPO status , Patient's Chart, lab work & pertinent test results  Airway Mallampati: II   Neck ROM: Full    Dental  (+) Partial Lower   Pulmonary neg pulmonary ROS, asthma , former smoker (quit 2005),    breath sounds clear to auscultation       Cardiovascular hypertension, Pt. on medications  Rhythm:Regular  ECHO 2016 EF 57%, Neg Stress 2015   Neuro/Psych negative neurological ROS  negative psych ROS   GI/Hepatic Neg liver ROS, GERD  Medicated,  Endo/Other  negative endocrine ROS  Renal/GU negative Renal ROS  negative genitourinary   Musculoskeletal negative musculoskeletal ROS (+)   Abdominal   Peds negative pediatric ROS (+)  Hematology negative hematology ROS (+) 12/37   Anesthesia Other Findings   Reproductive/Obstetrics negative OB ROS                            Anesthesia Physical Anesthesia Plan  ASA: III  Anesthesia Plan: General   Post-op Pain Management:    Induction: Intravenous  Airway Management Planned: Oral ETT  Additional Equipment:   Intra-op Plan:   Post-operative Plan: Extubation in OR  Informed Consent: I have reviewed the patients History and Physical, chart, labs and discussed the procedure including the risks, benefits and alternatives for the proposed anesthesia with the patient or authorized representative who has indicated his/her understanding and acceptance.     Plan Discussed with:   Anesthesia Plan Comments: (2nd IV)        Anesthesia Quick Evaluation

## 2015-09-10 ENCOUNTER — Inpatient Hospital Stay (HOSPITAL_COMMUNITY): Payer: BLUE CROSS/BLUE SHIELD | Admitting: Anesthesiology

## 2015-09-10 ENCOUNTER — Encounter (HOSPITAL_COMMUNITY): Payer: Self-pay | Admitting: Certified Registered Nurse Anesthetist

## 2015-09-10 ENCOUNTER — Inpatient Hospital Stay (HOSPITAL_COMMUNITY): Payer: BLUE CROSS/BLUE SHIELD

## 2015-09-10 ENCOUNTER — Encounter (HOSPITAL_COMMUNITY)
Admission: RE | Disposition: A | Discharge status: Home / Self Care | Payer: Self-pay | Source: Ambulatory Visit | Attending: Neurological Surgery

## 2015-09-10 ENCOUNTER — Inpatient Hospital Stay (HOSPITAL_COMMUNITY)
Admission: RE | Admit: 2015-09-10 | Discharge: 2015-09-12 | DRG: 460 | Disposition: A | Discharge status: Home / Self Care | Payer: BLUE CROSS/BLUE SHIELD | Source: Ambulatory Visit | Attending: Neurological Surgery | Admitting: Neurological Surgery

## 2015-09-10 ENCOUNTER — Inpatient Hospital Stay (HOSPITAL_COMMUNITY): Payer: BLUE CROSS/BLUE SHIELD | Admitting: Emergency Medicine

## 2015-09-10 DIAGNOSIS — M549 Dorsalgia, unspecified: Secondary | ICD-10-CM | POA: Diagnosis not present

## 2015-09-10 DIAGNOSIS — M5136 Other intervertebral disc degeneration, lumbar region: Secondary | ICD-10-CM | POA: Diagnosis present

## 2015-09-10 DIAGNOSIS — Z79899 Other long term (current) drug therapy: Secondary | ICD-10-CM

## 2015-09-10 DIAGNOSIS — I10 Essential (primary) hypertension: Secondary | ICD-10-CM | POA: Diagnosis not present

## 2015-09-10 DIAGNOSIS — Z923 Personal history of irradiation: Secondary | ICD-10-CM

## 2015-09-10 DIAGNOSIS — M4806 Spinal stenosis, lumbar region: Secondary | ICD-10-CM | POA: Diagnosis not present

## 2015-09-10 DIAGNOSIS — M47816 Spondylosis without myelopathy or radiculopathy, lumbar region: Secondary | ICD-10-CM | POA: Diagnosis not present

## 2015-09-10 DIAGNOSIS — K219 Gastro-esophageal reflux disease without esophagitis: Secondary | ICD-10-CM | POA: Diagnosis not present

## 2015-09-10 DIAGNOSIS — Z791 Long term (current) use of non-steroidal anti-inflammatories (NSAID): Secondary | ICD-10-CM

## 2015-09-10 DIAGNOSIS — Z7982 Long term (current) use of aspirin: Secondary | ICD-10-CM | POA: Diagnosis not present

## 2015-09-10 DIAGNOSIS — Z419 Encounter for procedure for purposes other than remedying health state, unspecified: Secondary | ICD-10-CM

## 2015-09-10 DIAGNOSIS — Z8546 Personal history of malignant neoplasm of prostate: Secondary | ICD-10-CM

## 2015-09-10 DIAGNOSIS — G4730 Sleep apnea, unspecified: Secondary | ICD-10-CM | POA: Diagnosis present

## 2015-09-10 DIAGNOSIS — Z981 Arthrodesis status: Secondary | ICD-10-CM

## 2015-09-10 DIAGNOSIS — E7800 Pure hypercholesterolemia, unspecified: Secondary | ICD-10-CM | POA: Diagnosis not present

## 2015-09-10 DIAGNOSIS — Z87891 Personal history of nicotine dependence: Secondary | ICD-10-CM

## 2015-09-10 DIAGNOSIS — J45909 Unspecified asthma, uncomplicated: Secondary | ICD-10-CM | POA: Diagnosis not present

## 2015-09-10 HISTORY — PX: POSTERIOR LAMINECTOMY / DECOMPRESSION LUMBAR SPINE: SUR740

## 2015-09-10 SURGERY — POSTERIOR LUMBAR FUSION 2 WITH HARDWARE REMOVAL
Anesthesia: General | Site: Back

## 2015-09-10 MED ORDER — PHENOL 1.4 % MT LIQD: 1.0000 | OROMUCOSAL | Status: DC | PRN

## 2015-09-10 MED ORDER — 0.9 % SODIUM CHLORIDE (POUR BTL) OPTIME
TOPICAL | Status: DC | PRN
  Administered 2015-09-10: 1000 mL

## 2015-09-10 MED ORDER — SODIUM CHLORIDE 0.9 % IV SOLN: 250.0000 mL | INTRAVENOUS | Status: DC

## 2015-09-10 MED ORDER — PROPOFOL 10 MG/ML IV BOLUS
INTRAVENOUS | Status: DC | PRN
  Administered 2015-09-10: 180 mg via INTRAVENOUS
  Administered 2015-09-10: 20 mg via INTRAVENOUS

## 2015-09-10 MED ORDER — METHOCARBAMOL 1000 MG/10ML IJ SOLN
500.0000 mg | Freq: Four times a day (QID) | INTRAVENOUS | Status: DC | PRN
  Filled 2015-09-10: qty 5

## 2015-09-10 MED ORDER — SODIUM CHLORIDE 0.9 % IR SOLN
Status: DC | PRN
  Administered 2015-09-10: 11:00:00

## 2015-09-10 MED ORDER — DEXAMETHASONE SODIUM PHOSPHATE 10 MG/ML IJ SOLN
10.0000 mg | INTRAMUSCULAR | Status: AC
  Administered 2015-09-10: 10 mg via INTRAVENOUS
  Filled 2015-09-10: qty 1

## 2015-09-10 MED ORDER — ONDANSETRON HCL 4 MG/2ML IJ SOLN: 4.0000 mg | INTRAMUSCULAR | Status: DC | PRN

## 2015-09-10 MED ORDER — ACETAMINOPHEN 325 MG PO TABS: 650.0000 mg | ORAL_TABLET | ORAL | Status: DC | PRN

## 2015-09-10 MED ORDER — METHOCARBAMOL 500 MG PO TABS
500.0000 mg | ORAL_TABLET | Freq: Four times a day (QID) | ORAL | Status: DC | PRN
  Administered 2015-09-10 – 2015-09-12 (×3): 500 mg via ORAL
  Filled 2015-09-10 (×2): qty 1

## 2015-09-10 MED ORDER — SODIUM CHLORIDE 0.9% FLUSH
3.0000 mL | Freq: Two times a day (BID) | INTRAVENOUS | Status: DC
  Administered 2015-09-10 – 2015-09-12 (×4): 3 mL via INTRAVENOUS

## 2015-09-10 MED ORDER — MENTHOL 3 MG MT LOZG: 1.0000 | LOZENGE | OROMUCOSAL | Status: DC | PRN

## 2015-09-10 MED ORDER — SODIUM CHLORIDE 0.9% FLUSH: 3.0000 mL | INTRAVENOUS | Status: DC | PRN

## 2015-09-10 MED ORDER — DEXTROSE 5 % IV SOLN
10.0000 mg | INTRAVENOUS | Status: DC | PRN
  Administered 2015-09-10: 20 ug/min via INTRAVENOUS

## 2015-09-10 MED ORDER — MEPERIDINE HCL 25 MG/ML IJ SOLN: 6.2500 mg | INTRAMUSCULAR | Status: DC | PRN

## 2015-09-10 MED ORDER — EPHEDRINE SULFATE 50 MG/ML IJ SOLN
INTRAMUSCULAR | Status: DC | PRN
  Administered 2015-09-10 (×2): 10 mg via INTRAVENOUS

## 2015-09-10 MED ORDER — AMLODIPINE BESYLATE 10 MG PO TABS
10.0000 mg | ORAL_TABLET | Freq: Every day | ORAL | Status: DC
  Administered 2015-09-11 – 2015-09-12 (×2): 10 mg via ORAL
  Filled 2015-09-10 (×2): qty 1

## 2015-09-10 MED ORDER — PHENYLEPHRINE 40 MCG/ML (10ML) SYRINGE FOR IV PUSH (FOR BLOOD PRESSURE SUPPORT)
PREFILLED_SYRINGE | INTRAVENOUS | Status: AC
  Filled 2015-09-10: qty 10

## 2015-09-10 MED ORDER — CEFAZOLIN SODIUM-DEXTROSE 2-4 GM/100ML-% IV SOLN
2.0000 g | INTRAVENOUS | Status: AC
  Administered 2015-09-10: 2 g via INTRAVENOUS
  Filled 2015-09-10: qty 100

## 2015-09-10 MED ORDER — MORPHINE SULFATE (PF) 2 MG/ML IV SOLN
1.0000 mg | INTRAVENOUS | Status: DC | PRN
  Administered 2015-09-10 – 2015-09-11 (×2): 2 mg via INTRAVENOUS
  Administered 2015-09-12: 4 mg via INTRAVENOUS
  Filled 2015-09-10: qty 2
  Filled 2015-09-10 (×2): qty 1
  Filled 2015-09-10: qty 2

## 2015-09-10 MED ORDER — MIDAZOLAM HCL 5 MG/5ML IJ SOLN
INTRAMUSCULAR | Status: DC | PRN
  Administered 2015-09-10: 2 mg via INTRAVENOUS

## 2015-09-10 MED ORDER — FENTANYL CITRATE (PF) 100 MCG/2ML IJ SOLN
INTRAMUSCULAR | Status: DC | PRN
  Administered 2015-09-10 (×2): 50 ug via INTRAVENOUS
  Administered 2015-09-10: 100 ug via INTRAVENOUS
  Administered 2015-09-10 (×2): 50 ug via INTRAVENOUS

## 2015-09-10 MED ORDER — PROPOFOL 500 MG/50ML IV EMUL
INTRAVENOUS | Status: DC | PRN
  Administered 2015-09-10: 50 ug/kg/min via INTRAVENOUS

## 2015-09-10 MED ORDER — THROMBIN 20000 UNITS EX SOLR
CUTANEOUS | Status: DC | PRN
  Administered 2015-09-10: 11:00:00 via TOPICAL

## 2015-09-10 MED ORDER — ROCURONIUM BROMIDE 100 MG/10ML IV SOLN
INTRAVENOUS | Status: DC | PRN
  Administered 2015-09-10: 20 mg via INTRAVENOUS
  Administered 2015-09-10: 30 mg via INTRAVENOUS

## 2015-09-10 MED ORDER — FUROSEMIDE 40 MG PO TABS
40.0000 mg | ORAL_TABLET | Freq: Every day | ORAL | Status: DC
  Administered 2015-09-11 – 2015-09-12 (×2): 40 mg via ORAL
  Filled 2015-09-10 (×2): qty 1

## 2015-09-10 MED ORDER — THROMBIN 5000 UNITS EX SOLR
OROMUCOSAL | Status: DC | PRN
  Administered 2015-09-10: 11:00:00 via TOPICAL

## 2015-09-10 MED ORDER — MIDAZOLAM HCL 2 MG/2ML IJ SOLN
INTRAMUSCULAR | Status: AC
  Filled 2015-09-10: qty 2

## 2015-09-10 MED ORDER — VANCOMYCIN HCL 1000 MG IV SOLR
INTRAVENOUS | Status: DC | PRN
  Administered 2015-09-10: 1000 mg

## 2015-09-10 MED ORDER — ONDANSETRON HCL 4 MG/2ML IJ SOLN
INTRAMUSCULAR | Status: DC | PRN
  Administered 2015-09-10: 4 mg via INTRAVENOUS

## 2015-09-10 MED ORDER — METHOCARBAMOL 500 MG PO TABS
ORAL_TABLET | ORAL | Status: AC
  Filled 2015-09-10: qty 1

## 2015-09-10 MED ORDER — SUGAMMADEX SODIUM 200 MG/2ML IV SOLN
INTRAVENOUS | Status: DC | PRN
  Administered 2015-09-10: 200 mg via INTRAVENOUS

## 2015-09-10 MED ORDER — LIDOCAINE HCL (CARDIAC) 20 MG/ML IV SOLN
INTRAVENOUS | Status: DC | PRN
  Administered 2015-09-10: 100 mg via INTRAVENOUS

## 2015-09-10 MED ORDER — LACTATED RINGERS IV SOLN
INTRAVENOUS | Status: DC | PRN
  Administered 2015-09-10: 13:00:00 via INTRAVENOUS

## 2015-09-10 MED ORDER — LACTATED RINGERS IV SOLN
INTRAVENOUS | Status: DC
Start: 2015-09-10 — End: 2015-09-10
  Administered 2015-09-10 (×2): via INTRAVENOUS

## 2015-09-10 MED ORDER — OXYCODONE-ACETAMINOPHEN 5-325 MG PO TABS
ORAL_TABLET | ORAL | Status: AC
  Filled 2015-09-10: qty 2

## 2015-09-10 MED ORDER — FENTANYL CITRATE (PF) 250 MCG/5ML IJ SOLN
INTRAMUSCULAR | Status: AC
  Filled 2015-09-10: qty 5

## 2015-09-10 MED ORDER — CELECOXIB 200 MG PO CAPS
200.0000 mg | ORAL_CAPSULE | Freq: Two times a day (BID) | ORAL | Status: DC
  Administered 2015-09-11 – 2015-09-12 (×3): 200 mg via ORAL
  Filled 2015-09-10 (×4): qty 1

## 2015-09-10 MED ORDER — CEFAZOLIN SODIUM 1-5 GM-% IV SOLN
1.0000 g | Freq: Three times a day (TID) | INTRAVENOUS | Status: AC
  Administered 2015-09-11 (×2): 1 g via INTRAVENOUS
  Filled 2015-09-10 (×3): qty 50

## 2015-09-10 MED ORDER — ACETAMINOPHEN 10 MG/ML IV SOLN
INTRAVENOUS | Status: AC
  Administered 2015-09-10: 1000 mg via INTRAVENOUS
  Filled 2015-09-10: qty 100

## 2015-09-10 MED ORDER — BUPIVACAINE HCL (PF) 0.25 % IJ SOLN
INTRAMUSCULAR | Status: DC | PRN
  Administered 2015-09-10: 5 mL

## 2015-09-10 MED ORDER — ONDANSETRON HCL 4 MG/2ML IJ SOLN: 4.0000 mg | Freq: Four times a day (QID) | INTRAMUSCULAR | Status: DC

## 2015-09-10 MED ORDER — SUGAMMADEX SODIUM 200 MG/2ML IV SOLN
INTRAVENOUS | Status: AC
  Filled 2015-09-10: qty 2

## 2015-09-10 MED ORDER — OXYCODONE-ACETAMINOPHEN 5-325 MG PO TABS
1.0000 | ORAL_TABLET | ORAL | Status: DC | PRN
  Administered 2015-09-10 – 2015-09-12 (×6): 2 via ORAL
  Filled 2015-09-10 (×6): qty 2

## 2015-09-10 MED ORDER — FENTANYL CITRATE (PF) 100 MCG/2ML IJ SOLN
25.0000 ug | INTRAMUSCULAR | Status: DC | PRN
  Administered 2015-09-10 (×2): 50 ug via INTRAVENOUS

## 2015-09-10 MED ORDER — MONTELUKAST SODIUM 10 MG PO TABS
10.0000 mg | ORAL_TABLET | Freq: Every day | ORAL | Status: DC
  Administered 2015-09-11 (×2): 10 mg via ORAL
  Filled 2015-09-10 (×2): qty 1

## 2015-09-10 MED ORDER — ACETAMINOPHEN 650 MG RE SUPP: 650.0000 mg | RECTAL | Status: DC | PRN

## 2015-09-10 MED ORDER — FENTANYL CITRATE (PF) 100 MCG/2ML IJ SOLN
INTRAMUSCULAR | Status: AC
  Filled 2015-09-10: qty 2

## 2015-09-10 MED ORDER — ALBUMIN HUMAN 5 % IV SOLN
INTRAVENOUS | Status: DC | PRN
  Administered 2015-09-10: 12:00:00 via INTRAVENOUS

## 2015-09-10 MED ORDER — PROPOFOL 10 MG/ML IV BOLUS
INTRAVENOUS | Status: AC
  Filled 2015-09-10: qty 20

## 2015-09-10 MED ORDER — SUCCINYLCHOLINE CHLORIDE 20 MG/ML IJ SOLN
INTRAMUSCULAR | Status: DC | PRN
  Administered 2015-09-10: 110 mg via INTRAVENOUS

## 2015-09-10 MED ORDER — PHENYLEPHRINE HCL 10 MG/ML IJ SOLN
INTRAMUSCULAR | Status: DC | PRN
  Administered 2015-09-10: 240 ug via INTRAVENOUS
  Administered 2015-09-10: 120 ug via INTRAVENOUS
  Administered 2015-09-10: 80 ug via INTRAVENOUS

## 2015-09-10 MED ORDER — EZETIMIBE-SIMVASTATIN 10-40 MG PO TABS
1.0000 | ORAL_TABLET | Freq: Every day | ORAL | Status: DC
  Administered 2015-09-11 – 2015-09-12 (×2): 1 via ORAL
  Filled 2015-09-10 (×4): qty 1

## 2015-09-10 MED ORDER — POTASSIUM CHLORIDE IN NACL 20-0.9 MEQ/L-% IV SOLN
INTRAVENOUS | Status: DC
  Administered 2015-09-11: 01:00:00 via INTRAVENOUS
  Filled 2015-09-10 (×2): qty 1000

## 2015-09-10 MED ORDER — LOSARTAN POTASSIUM 50 MG PO TABS
100.0000 mg | ORAL_TABLET | Freq: Every day | ORAL | Status: DC
  Administered 2015-09-11 – 2015-09-12 (×2): 100 mg via ORAL
  Filled 2015-09-10 (×2): qty 2

## 2015-09-10 MED ORDER — VANCOMYCIN HCL 1000 MG IV SOLR
INTRAVENOUS | Status: AC
  Filled 2015-09-10: qty 1000

## 2015-09-10 MED ORDER — GUANFACINE HCL ER 1 MG PO TB24
1.0000 mg | ORAL_TABLET | Freq: Every day | ORAL | Status: DC
  Administered 2015-09-11 – 2015-09-12 (×2): 1 mg via ORAL
  Filled 2015-09-10 (×4): qty 1

## 2015-09-10 MED ORDER — ASPIRIN 325 MG PO TABS
325.0000 mg | ORAL_TABLET | ORAL | Status: DC
  Administered 2015-09-10 – 2015-09-12 (×2): 325 mg via ORAL
  Filled 2015-09-10 (×2): qty 1

## 2015-09-10 MED ORDER — PANTOPRAZOLE SODIUM 40 MG PO TBEC
40.0000 mg | DELAYED_RELEASE_TABLET | Freq: Every day | ORAL | Status: DC
Start: 2015-09-11 — End: 2015-09-12
  Administered 2015-09-11 – 2015-09-12 (×2): 40 mg via ORAL
  Filled 2015-09-10 (×2): qty 1

## 2015-09-10 MED FILL — Heparin Sodium (Porcine) Inj 1000 Unit/ML: INTRAMUSCULAR | Qty: 30 | Status: AC

## 2015-09-10 MED FILL — Sodium Chloride IV Soln 0.9%: INTRAVENOUS | Qty: 1000 | Status: AC

## 2015-09-10 SURGICAL SUPPLY — 70 items
ADH SKN CLS APL DERMABOND .7 (GAUZE/BANDAGES/DRESSINGS) ×1
APL SKNCLS STERI-STRIP NONHPOA (GAUZE/BANDAGES/DRESSINGS) ×1
BAG DECANTER FOR FLEXI CONT (MISCELLANEOUS) ×3 IMPLANT
BENZOIN TINCTURE PRP APPL 2/3 (GAUZE/BANDAGES/DRESSINGS) ×3 IMPLANT
BIT DRILL PLIF MAS 5.0MM DISP (DRILL) IMPLANT
BLADE CLIPPER SURG (BLADE) IMPLANT
BONE CANC CHIPS 40CC CAN1/2 (Bone Implant) ×3 IMPLANT
BONE MATRIX OSTEOCEL PRO MED (Bone Implant) ×2 IMPLANT
BUR MATCHSTICK NEURO 3.0 LAGG (BURR) ×3 IMPLANT
CANISTER SUCT 3000ML PPV (MISCELLANEOUS) ×3 IMPLANT
CHIPS CANC BONE 40CC CAN1/2 (Bone Implant) ×1 IMPLANT
CLIP NEUROVISION LG (CLIP) ×2 IMPLANT
CLOSURE WOUND 1/2 X4 (GAUZE/BANDAGES/DRESSINGS) ×1
CONT SPEC 4OZ CLIKSEAL STRL BL (MISCELLANEOUS) ×3 IMPLANT
COVER BACK TABLE 24X17X13 BIG (DRAPES) IMPLANT
COVER BACK TABLE 60X90IN (DRAPES) ×3 IMPLANT
DERMABOND ADVANCED (GAUZE/BANDAGES/DRESSINGS) ×2
DERMABOND ADVANCED .7 DNX12 (GAUZE/BANDAGES/DRESSINGS) IMPLANT
DRAPE C-ARM 42X72 X-RAY (DRAPES) ×6 IMPLANT
DRAPE LAPAROTOMY 100X72X124 (DRAPES) ×3 IMPLANT
DRAPE POUCH INSTRU U-SHP 10X18 (DRAPES) ×3 IMPLANT
DRAPE SURG 17X23 STRL (DRAPES) ×3 IMPLANT
DRILL PLIF MAS 5.0MM DISP (DRILL) ×3
DRSG OPSITE POSTOP 3X4 (GAUZE/BANDAGES/DRESSINGS) ×2 IMPLANT
DURAPREP 26ML APPLICATOR (WOUND CARE) ×3 IMPLANT
ELECT REM PT RETURN 9FT ADLT (ELECTROSURGICAL) ×3
ELECTRODE REM PT RTRN 9FT ADLT (ELECTROSURGICAL) ×1 IMPLANT
EVACUATOR 1/8 PVC DRAIN (DRAIN) ×3 IMPLANT
GAUZE SPONGE 4X4 16PLY XRAY LF (GAUZE/BANDAGES/DRESSINGS) IMPLANT
GLOVE BIO SURGEON STRL SZ8 (GLOVE) ×6 IMPLANT
GLOVE ECLIPSE 7.5 STRL STRAW (GLOVE) ×3 IMPLANT
GLOVE INDICATOR 7.5 STRL GRN (GLOVE) ×8 IMPLANT
GLOVE INDICATOR 8.0 STRL GRN (GLOVE) ×4 IMPLANT
GLOVE SS N UNI LF 7.0 STRL (GLOVE) ×4 IMPLANT
GOWN STRL REUS W/ TWL LRG LVL3 (GOWN DISPOSABLE) IMPLANT
GOWN STRL REUS W/ TWL XL LVL3 (GOWN DISPOSABLE) ×2 IMPLANT
GOWN STRL REUS W/TWL 2XL LVL3 (GOWN DISPOSABLE) IMPLANT
GOWN STRL REUS W/TWL LRG LVL3 (GOWN DISPOSABLE)
GOWN STRL REUS W/TWL XL LVL3 (GOWN DISPOSABLE) ×6
GRAFT BNE CHIP CANC 1-8 40 (Bone Implant) IMPLANT
HEMOSTAT POWDER KIT SURGIFOAM (HEMOSTASIS) IMPLANT
KIT BASIN OR (CUSTOM PROCEDURE TRAY) ×3 IMPLANT
KIT ROOM TURNOVER OR (KITS) ×3 IMPLANT
MILL MEDIUM DISP (BLADE) ×3 IMPLANT
MODULE NVM5 NEXT GEN EMG (NEEDLE) ×3 IMPLANT
NDL HYPO 25X1 1.5 SAFETY (NEEDLE) ×1 IMPLANT
NEEDLE HYPO 25X1 1.5 SAFETY (NEEDLE) ×3 IMPLANT
NS IRRIG 1000ML POUR BTL (IV SOLUTION) ×3 IMPLANT
PACK LAMINECTOMY NEURO (CUSTOM PROCEDURE TRAY) ×3 IMPLANT
PAD ARMBOARD 7.5X6 YLW CONV (MISCELLANEOUS) ×9 IMPLANT
PRECEPT TULIPS (Neuro Prosthesis/Implant) ×6 IMPLANT
ROD 55MM (Rod) ×6 IMPLANT
ROD SPNL 55XPREBNT NS MAS (Rod) IMPLANT
SCREW LOCK (Screw) ×18 IMPLANT
SCREW LOCK FXNS SPNE MAS PL (Screw) IMPLANT
SCREW SHANK 5.0X35 (Screw) ×8 IMPLANT
SCREW SHANK PRECEPT 7.5X45MM (Screw) ×4 IMPLANT
SCREW TULIP 5.5 (Screw) ×8 IMPLANT
SPONGE LAP 4X18 X RAY DECT (DISPOSABLE) IMPLANT
SPONGE SURGIFOAM ABS GEL 100 (HEMOSTASIS) ×3 IMPLANT
STRIP CLOSURE SKIN 1/2X4 (GAUZE/BANDAGES/DRESSINGS) ×3 IMPLANT
SUT VIC AB 0 CT1 18XCR BRD8 (SUTURE) ×1 IMPLANT
SUT VIC AB 0 CT1 8-18 (SUTURE) ×3
SUT VIC AB 2-0 CP2 18 (SUTURE) ×3 IMPLANT
SUT VIC AB 3-0 SH 8-18 (SUTURE) ×6 IMPLANT
TOWEL OR 17X24 6PK STRL BLUE (TOWEL DISPOSABLE) ×3 IMPLANT
TOWEL OR 17X26 10 PK STRL BLUE (TOWEL DISPOSABLE) ×3 IMPLANT
TRAP SPECIMEN MUCOUS 40CC (MISCELLANEOUS) IMPLANT
TRAY FOLEY W/METER SILVER 14FR (SET/KITS/TRAYS/PACK) ×3 IMPLANT
WATER STERILE IRR 1000ML POUR (IV SOLUTION) ×3 IMPLANT

## 2015-09-10 NOTE — Evaluation (Signed)
Physical Therapy Evaluation Patient Details Name: Keith Webb MRN: [id number] DOB: April 11, 1951 Today's Date: 09/10/2015   History of Present Illness  65 year old male s/p for L4-5 decompressive laminectomy, L3-4, L4-5 posterior lateral fusion, removal of biomet screws L5-S1, coritcal pedicle screws L3-5   Clinical Impression  Patient demonstrates deficits in functional mobility as indicated below. Will need continued skilled PT to address deficits and maximize function. Will see as indicated and progress as tolerated.    Follow Up Recommendations No PT follow up;Supervision for mobility/OOB    Equipment Recommendations  Rolling walker with 5" wheels;3in1 (PT)    Recommendations for Other Services       Precautions / Restrictions Precautions Precautions: Back Precaution Booklet Issued: Yes (comment) Precaution Comments: verbally reviewed with teach back Required Braces or Orthoses: Spinal Brace Spinal Brace: Lumbar corset Restrictions Weight Bearing Restrictions: No      Mobility  Bed Mobility Overal bed mobility: Needs Assistance Bed Mobility: Rolling;Sidelying to Sit;Sit to Sidelying Rolling: Min guard Sidelying to sit: Min guard     Sit to sidelying: Min assist General bed mobility comments: Min guard with VCs for technique. Assist for elevation of LE back to bed.  Transfers Overall transfer level: Needs assistance Equipment used: Rolling walker (2 wheeled) Transfers: Sit to/from Stand Sit to Stand: Min guard         General transfer comment: Vcs for hand placement  Ambulation/Gait Ambulation/Gait assistance: Min guard Ambulation Distance (Feet): 20 Feet Assistive device: Rolling walker (2 wheeled) Gait Pattern/deviations: Step-through pattern;Decreased stride length     General Gait Details: steady with ambulation, min guard for safety   Stairs            Wheelchair Mobility    Modified Rankin (Stroke Patients Only)       Balance  Overall balance assessment: No apparent balance deficits (not formally assessed)                                           Pertinent Vitals/Pain Pain Assessment: 0-10    Home Living Family/patient expects to be discharged to:: Private residence Living Arrangements: Spouse/significant other Available Help at Discharge: Family Type of Home: House Home Access: Stairs to enter Entrance Stairs-Rails: Can reach both Entrance Stairs-Number of Steps: 5 Home Layout: One level Home Equipment: Walker - standard;Cane - single point;Crutches      Prior Function Level of Independence: Independent               Hand Dominance   Dominant Hand: Right    Extremity/Trunk Assessment                         Communication   Communication: No difficulties  Cognition Arousal/Alertness: Awake/alert Behavior During Therapy: WFL for tasks assessed/performed Overall Cognitive Status: Within Functional Limits for tasks assessed                      General Comments      Exercises        Assessment/Plan    PT Assessment Patient needs continued PT services  PT Diagnosis Difficulty walking;Acute pain   PT Problem List Decreased strength;Decreased activity tolerance;Decreased balance;Decreased coordination;Decreased knowledge of precautions  PT Treatment Interventions DME instruction;Gait training;Stair training;Functional mobility training;Therapeutic activities;Therapeutic exercise;Balance training;Patient/family education   PT Goals (Current goals can be found in the  Care Plan section) Acute Rehab PT Goals Patient Stated Goal: to go home PT Goal Formulation: With patient Time For Goal Achievement: 09/24/15 Potential to Achieve Goals: Good    Frequency Min 5X/week   Barriers to discharge        Co-evaluation PT/OT/SLP Co-Evaluation/Treatment:  (Dove tail evaluation)             End of Session Equipment Utilized During Treatment: Gait  belt;Back brace Activity Tolerance: Patient tolerated treatment well;Treatment limited secondary to medical complications (Comment) (some dizziness when sitting in chair so assist back to bed) Patient left: in bed;with call bell/phone within reach;with bed alarm set Nurse Communication: Mobility status         Time: [id number] PT Time Calculation (min) (ACUTE ONLY): 31 min   Charges:   PT Evaluation $PT Eval Moderate Complexity: 1 Procedure     PT G CodesDuncan Dull September 19, 2015, 5:13 PM Alben Deeds, [place] DPT  [phone]

## 2015-09-10 NOTE — Anesthesia Postprocedure Evaluation (Signed)
Anesthesia Post Note  Patient: Keith Webb  Procedure(s) Performed: Procedure(s) (LRB): Decompressive Laminectomy Lumbar four-five Posterior Lateral Fusion - Lumbar three-four lumbar four-five, Removal of Biomet screws Lumbar five-sacral one, cortical pedicle screws Lumbar three to lumbar five (N/A)  Patient location during evaluation: PACU Anesthesia Type: General Level of consciousness: awake and alert Pain management: pain level controlled Vital Signs Assessment: post-procedure vital signs reviewed and stable Respiratory status: spontaneous breathing, nonlabored ventilation, respiratory function stable and patient connected to nasal cannula oxygen Cardiovascular status: blood pressure returned to baseline and stable Postop Assessment: no signs of nausea or vomiting Anesthetic complications: no    Last Vitals:  Filed Vitals:   09/10/15 1410 09/10/15 1415  BP:  128/84  Pulse: 85 81  Temp: 36.4 C   Resp: 32 11    Last Pain:  Filed Vitals:   09/10/15 1420  PainSc: 3                  Alexis Frock

## 2015-09-10 NOTE — Op Note (Signed)
09/10/2015  2:06 PM  PATIENT:  Keith Webb  65 y.o. male  PRE-OPERATIVE DIAGNOSIS:  Lumbar spondylosis with adjacent level stenosis and degenerative disc disease, back and leg pain  POST-OPERATIVE DIAGNOSIS:  same  PROCEDURE:   1. Decompressive lumbar laminectomy L4-5 left 2. Posterior fixation L3-5 using nuvasive cortical screws.  3. Intertransverse arthrodesis L3-5 using morcellized autograft and allograft. 4. Removal of instrumentation L5-S1   SURGEON:  Sherley Bounds, MD  ASSISTANTS: Dr ditty  ANESTHESIA:  General  EBL: 250 ml  Total I/O In: [id number] [I.V.:1600; IV Piggyback:250] Out: 390 [Urine:140; Blood:250]  BLOOD ADMINISTERED:none  DRAINS: Hemovac   INDICATION FOR PROCEDURE: this patient underwent a previous L5-S1 fusion he presented with a several month history of back and left leg pain. MRI and CT scan showed severe degenerative disc disease L4-5 with spinal stenosis. He had degenerative disc disease L3-4. He tried medical management without relief. I recommended decompression and instrumented fusion above his previous fusion. Patient understood the risks, benefits, and alternatives and potential outcomes and wished to proceed.  PROCEDURE DETAILS:  The patient was brought to the operating room. After induction of generalized endotracheal anesthesia the patient was rolled into the prone position on chest rolls and all pressure points were padded. The patient's lumbar region was cleaned and then prepped with DuraPrep and draped in the usual sterile fashion. Anesthesia was injected and then a dorsal midline incision was made and carried down to the lumbosacral fascia. The fascia was opened and the paraspinous musculature was taken down in a subperiosteal fashion to expose L3-L5 as well as the previously placed hardware. A self-retaining retractor was placed. I removed the locking caps from the previous tulip heads. I removed the rods and then removed each pedicle screw in  succession. Intraoperative fluoroscopy confirmed my level, and I started with placement of the L3 cortical pedicle screws. The pedicle screw entry zones were identified utilizing surface landmarks and  AP and lateral fluoroscopy. I scored the cortex with the high-speed drill and then used the hand drill and EMG monitoring to drill an upward and outward direction into the pedicle. I then tapped line to line, and the tap was also monitored. I then placed a 5-0 x 35 mm cortical pedicle screw into the pedicles of L3 bilaterally. I then turned my attention to the decompression and the spinous process was removed and complete lumbar laminectomies, hemi- facetectomies, and foraminotomies were performed at L4-5 left. . The yellow ligament was removed to expose the underlying dura and nerve root, and generous foraminotomy was performed to adequately decompress the neural elements. Both the exiting and traversing nerve roots were decompressed  until a coronary dilator passed easily along the nerve roots.   We then turned our attention to the placement of the lower pedicle screws. The pedicle screw entry zones were identified utilizing surface landmarks and fluoroscopy. I drilled into each pedicle utilizing the hand drill and EMG monitoring, and tapped each pedicle with the appropriate tap. We palpated with a ball probe to assure no break in the cortex. We then placed 5-0 x 35 mm cortical pedicle screwsnto the pedicles bilaterally in L4 and placed 7 5 x 45 mm screws into the previous L5 holes. . We then decorticated the transverse processes and laid a mixture of morcellized autograft and allograft out over these to perform intertransverse arthrodesis at L3-L5 bilaterally. We then placed lordotic rods into the multiaxial screw heads of the pedicle screws and locked these in position with the  locking caps and anti-torque device. We then checked our construct with AP and lateral fluoroscopy. Irrigated with copious amounts of  bacitracin-containing saline solution. Placed a medium Hemovac drain through separate stab incision. Inspected the nerve roots once again to assure adequate decompression, lined to the dura with Gelfoam, and closed the muscle and the fascia with 0 Vicryl. Closed the subcutaneous tissues with 2-0 Vicryl and subcuticular tissues with 3-0 Vicryl. The skin was closed with benzoin and Steri-Strips. Dressing was then applied, the patient was awakened from general anesthesia and transported to the recovery room in stable condition. At the end of the procedure all sponge, needle and instrument counts were correct.   PLAN OF CARE: Admit to inpatient   PATIENT DISPOSITION:  PACU - hemodynamically stable.   Delay start of Pharmacological VTE agent (>24hrs) due to surgical blood loss or risk of bleeding:  yes

## 2015-09-10 NOTE — Progress Notes (Signed)
Utilization review completed.  L. J. Storey Stangeland RN, BSN, CM 

## 2015-09-10 NOTE — Transfer of Care (Signed)
Immediate Anesthesia Transfer of Care Note  Patient: Keith Webb  Procedure(s) Performed: Procedure(s): Decompressive Laminectomy Lumbar four-five Posterior Lateral Fusion - Lumbar three-four lumbar four-five, Removal of Biomet screws Lumbar five-sacral one, cortical pedicle screws Lumbar three to lumbar five (N/A)  Patient Location: PACU  Anesthesia Type:General  Level of Consciousness: awake, alert , oriented and patient cooperative  Airway & Oxygen Therapy: Patient Spontanous Breathing and Patient connected to nasal cannula oxygen  Post-op Assessment: Report given to RN and Post -op Vital signs reviewed and stable  Post vital signs: Reviewed and stable  Last Vitals:  Filed Vitals:   09/10/15 1029  BP: 120/94  Pulse: 86  Temp: 36.9 C  Resp: 20    Complications: No apparent anesthesia complications

## 2015-09-10 NOTE — Addendum Note (Signed)
Addendum  created 09/10/15 1630 by Layla Maw, CRNA   Modules edited: Charges VN

## 2015-09-10 NOTE — H&P (Signed)
Subjective: Patient is a 65 y.o. male admitted for lumbar fusion. Onset of symptoms was several months ago, gradually worsening since that time.  The pain is rated severe, and is located at the across the lower back and radiates to LLE. The pain is described as aching and occurs all day. The symptoms have been progressive. Symptoms are exacerbated by exercise. MRI or CT showed adjacent level disease.   Past Medical History  Diagnosis Date  . Hypercholesterolemia   . Hypertension   . Sleep apnea   . Prostate cancer ([place]) 1998    gleason 7, PSA 18.6  . S/P radiation therapy 06/24/2014 through 08/10/2014     Prostate bed 6600 cGy in 33 sessions   . Heart murmur   . Shortness of breath dyspnea   . GERD (gastroesophageal reflux disease)   . Arthritis     Past Surgical History  Procedure Laterality Date  . Back surgery    . Elbow surgery    . Hip surgery    . Wrist carpectomy Bilateral   . Prostatectomy  1998    gleason 7    Prior to Admission medications   Medication Sig Start Date End Date Taking? Authorizing Provider  amLODipine (NORVASC) 10 MG tablet Take 10 mg by mouth daily.   Yes Historical Provider, MD  aspirin 325 MG tablet Take 325 mg by mouth every other day.    Yes Historical Provider, MD  dexlansoprazole (DEXILANT) 60 MG capsule Take 60 mg by mouth daily.   Yes Historical Provider, MD  Diclofenac (ZORVOLEX) 35 MG CAPS Take 35 mg by mouth 3 (three) times daily.   Yes Historical Provider, MD  ezetimibe-simvastatin (VYTORIN) 10-40 MG per tablet Take 1 tablet by mouth daily.   Yes Historical Provider, MD  furosemide (LASIX) 40 MG tablet Take 40 mg by mouth daily.    Yes Historical Provider, MD  guanFACINE (INTUNIV) 1 MG TB24 Take 1 mg by mouth daily.   Yes Historical Provider, MD  IRON PO Take 1 tablet by mouth daily.   Yes Historical Provider, MD  losartan (COZAAR) 100 MG tablet Take 100 mg by mouth  daily.   Yes Historical Provider, MD  meloxicam (MOBIC) 15 MG tablet Take 15 mg by mouth daily.   Yes Historical Provider, MD  montelukast (SINGULAIR) 10 MG tablet Take 10 mg by mouth at bedtime.   Yes Historical Provider, MD  Multiple Vitamins-Minerals (CENTRUM SILVER PO) Take 1 tablet by mouth daily.   Yes Historical Provider, MD  sildenafil (VIAGRA) 100 MG tablet Take 100 mg by mouth daily as needed for erectile dysfunction.   Yes Historical Provider, MD  albuterol (PROVENTIL HFA;VENTOLIN HFA) 108 (90 BASE) MCG/ACT inhaler Inhale 2 puffs into the lungs every 6 (six) hours as needed for wheezing or shortness of breath. Patient not taking: Reported on 08/31/2015 10/02/14   Darreld Mclean, MD  Dexlansoprazole (DEXILANT PO) Take by mouth.    Historical Provider, MD  doxycycline (VIBRAMYCIN) 100 MG capsule Take 1 capsule (100 mg total) by mouth 2 (two) times daily. Patient not taking: Reported on 08/31/2015 10/02/14   Darreld Mclean, MD  predniSONE (DELTASONE) 20 MG tablet Take 2 pills a day for 3 days Patient not taking: Reported on 08/31/2015 10/02/14   Darreld Mclean, MD   No Known Allergies  Social History  Substance Use Topics  . Smoking status: Former Smoker    Types: Cigars    Quit date: 05/22/2004  . Smokeless tobacco: Not on  file  . Alcohol Use: No    Family History  Problem Relation Age of Onset  . Stroke Mother   . Diabetes Sister   . Cancer Brother     prostate, surgery     Review of Systems  Positive ROS: neg  All other systems have been reviewed and were otherwise negative with the exception of those mentioned in the HPI and as above.  Objective: Vital signs in last 24 hours:    General Appearance: Alert, cooperative, no distress, appears stated age Head: Normocephalic, without obvious abnormality, atraumatic Eyes: PERRL, conjunctiva/corneas clear, EOM's intact    Neck: Supple, symmetrical, trachea midline Back: Symmetric, no curvature, ROM normal, no CVA  tenderness Lungs:  respirations unlabored Heart: Regular rate and rhythm Abdomen: Soft, non-tender Extremities: Extremities normal, atraumatic, no cyanosis or edema Pulses: 2+ and symmetric all extremities Skin: Skin color, texture, turgor normal, no rashes or lesions  NEUROLOGIC:   Mental status: Alert and oriented x4,  no aphasia, good attention span, fund of knowledge, and memory Motor Exam - grossly normal Sensory Exam - grossly normal Reflexes: 1+ Coordination - grossly normal Gait - grossly normal Balance - grossly normal Cranial Nerves: I: smell Not tested  II: visual acuity  OS: nl    OD: nl  II: visual fields Full to confrontation  II: pupils Equal, round, reactive to light  III,VII: ptosis None  III,IV,VI: extraocular muscles  Full ROM  V: mastication Normal  V: facial light touch sensation  Normal  V,VII: corneal reflex  Present  VII: facial muscle function - upper  Normal  VII: facial muscle function - lower Normal  VIII: hearing Not tested  IX: soft palate elevation  Normal  IX,X: gag reflex Present  XI: trapezius strength  5/5  XI: sternocleidomastoid strength 5/5  XI: neck flexion strength  5/5  XII: tongue strength  Normal    Data Review Lab Results  Component Value Date   WBC 5.5 09/03/2015   HGB 12.6* 09/03/2015   HCT 37.0* 09/03/2015   MCV 92.5 09/03/2015   PLT 231 09/03/2015   Lab Results  Component Value Date   NA 138 09/03/2015   K 3.9 09/03/2015   CL 106 09/03/2015   CO2 22 09/03/2015   BUN 20 09/03/2015   CREATININE 1.18 09/03/2015   GLUCOSE 90 09/03/2015   Lab Results  Component Value Date   INR 1.03 09/03/2015    Assessment/Plan: Patient admitted for lumbar decompression and fusion. Patient has failed a reasonable attempt at conservative therapy.  I explained the condition and procedure to the patient and answered any questions.  Patient wishes to proceed with procedure as planned. Understands risks/ benefits and typical  outcomes of procedure.   Keith Webb S 09/10/2015 10:27 AM

## 2015-09-10 NOTE — Evaluation (Signed)
Occupational Therapy Evaluation Patient Details Name: Keith Webb MRN: [id number] DOB: 1950-10-31 Today's Date: 09/10/2015    History of Present Illness 65 year old male s/p for L4-5 decompressive laminectomy, L3-4, L4-5 posterior lateral fusion, removal of biomet screws L5-S1, coritcal pedicle screws L3-5    Clinical Impression   Pt reports he was independent with ADLs and mobility PTA. Currently pt overall min guard for functional mobility and min assist for ADLs. Began safety, ADL, and safety education with pt. Pt with increased dizziness with OOB activities but VSS. Pt planning to d/c home with 24/7 supervision from his wife. Pt would benefit from continued skilled OT to address established goals.    Follow Up Recommendations  No OT follow up;Supervision - Intermittent    Equipment Recommendations  3 in 1 bedside comode;Other (comment) (AE?)    Recommendations for Other Services       Precautions / Restrictions Precautions Precautions: Back Precaution Booklet Issued: Yes (comment) Precaution Comments: verbally reviewed with teach back Required Braces or Orthoses: Spinal Brace Spinal Brace: Lumbar corset Restrictions Weight Bearing Restrictions: No      Mobility Bed Mobility Overal bed mobility: Needs Assistance Bed Mobility: Rolling;Sidelying to Sit;Sit to Sidelying Rolling: Min guard Sidelying to sit: Min guard     Sit to sidelying: Min assist General bed mobility comments: Min guard with VCs for technique. Assist for elevation of LE back to bed.  Transfers Overall transfer level: Needs assistance Equipment used: Rolling walker (2 wheeled) Transfers: Sit to/from Stand Sit to Stand: Min guard         General transfer comment: Vcs for hand placement    Balance Overall balance assessment: No apparent balance deficits (not formally assessed)                                          ADL Overall ADL's : Needs  assistance/impaired Eating/Feeding: Set up;Sitting   Grooming: Set up;Sitting           Upper Body Dressing : Maximal assistance;Sitting Upper Body Dressing Details (indicate cue type and reason): to don brace Lower Body Dressing: Minimal assistance;Sit to/from stand Lower Body Dressing Details (indicate cue type and reason): Pt able to cross foot over opposite knee today. Wife available to assist as needed. Toilet Transfer: Min guard;Ambulation;BSC;RW (BSC over toilet) Toilet Transfer Details (indicate cue type and reason): Simulated by transfer from EOB to chair Toileting- Clothing Manipulation and Hygiene: Maximal assistance;Sit to/from stand       Functional mobility during ADLs: Min guard;Rolling walker General ADL Comments: Pt still heavily medicated from surgery; unsure of recall of education presented today. Educated pt on LB dressing technique, maintaining precautions during functional activities, log roll technique for bed mobility.     Vision     Perception     Praxis      Pertinent Vitals/Pain Pain Assessment: 0-10 Pain Score: 10-Worst pain ever Pain Location: back +dizziness Pain Descriptors / Indicators: Aching;Grimacing Pain Intervention(s): Limited activity within patient's tolerance;Monitored during session;Premedicated before session;Repositioned     Hand Dominance Right   Extremity/Trunk Assessment Upper Extremity Assessment Upper Extremity Assessment: Overall WFL for tasks assessed   Lower Extremity Assessment Lower Extremity Assessment: Defer to PT evaluation   Cervical / Trunk Assessment Cervical / Trunk Assessment: Normal   Communication Communication Communication: No difficulties   Cognition Arousal/Alertness: Awake/alert Behavior During Therapy: WFL for tasks assessed/performed Overall  Cognitive Status: Within Functional Limits for tasks assessed                     General Comments       Exercises       Shoulder  Instructions      Home Living Family/patient expects to be discharged to:: Private residence Living Arrangements: Spouse/significant other Available Help at Discharge: Family Type of Home: House Home Access: Stairs to enter Technical brewer of Steps: 5 Entrance Stairs-Rails: Can reach both [place]: One level     Bathroom Shower/Tub: Occupational psychologist: Handicapped height     [place]: Environmental consultant - standard;Cane - single point;Crutches          Prior Functioning/Environment Level of Independence: Independent             OT Diagnosis: Acute pain   OT Problem List: Decreased activity tolerance;Decreased safety awareness;Decreased knowledge of use of DME or AE;Decreased knowledge of precautions;Obesity;Pain   OT Treatment/Interventions: Self-care/ADL training;Energy conservation;DME and/or AE instruction;Therapeutic activities;Patient/family education;Balance training    OT Goals(Current goals can be found in the care plan section) Acute Rehab OT Goals Patient Stated Goal: to go home OT Goal Formulation: With patient Time For Goal Achievement: 09/24/15 Potential to Achieve Goals: Good ADL Goals Pt Will Perform Grooming: with modified independence;standing Pt Will Perform Lower Body Bathing: with modified independence;sit to/from stand (with or without AE) Pt Will Perform Lower Body Dressing: with modified independence;sit to/from stand (with or without AE) Pt Will Transfer to Toilet: with modified independence;ambulating;bedside commode (over toilet) Pt Will Perform Toileting - Clothing Manipulation and hygiene: with modified independence;sit to/from stand (with or without AE) Additional ADL Goal #1: Pt will independently don/doff back brace as precursor for ADLs and functional mobility. Additional ADL Goal #2: Pt will independently verbally recall 3/3 back precautions and maintain during ADL.  OT Frequency: Min 2X/week   Barriers to D/C:             Co-evaluation              End of Session Equipment Utilized During Treatment: Gait belt;Rolling walker;Back brace Nurse Communication: Mobility status  Activity Tolerance: Patient tolerated treatment well (+dizziness in sitting; returned to bed) Patient left: in bed;with call bell/phone within reach;with bed alarm set;with SCD's reapplied   Time: [id number] OT Time Calculation (min): 32 min Charges:  OT General Charges $OT Visit: 1 Procedure OT Evaluation $OT Eval Moderate Complexity: 1 Procedure G-Codes:     Binnie Kand M.S., OTR/L Pager: [phone]  09/10/2015, 5:35 PM

## 2015-09-10 NOTE — Anesthesia Procedure Notes (Signed)
Procedure Name: Intubation Date/Time: 09/10/2015 11:28 AM Performed by: Layla Maw Pre-anesthesia Checklist: Patient identified, Patient being monitored, Timeout performed, Emergency Drugs available and Suction available Patient Re-evaluated:Patient Re-evaluated prior to inductionOxygen Delivery Method: Circle System Utilized Preoxygenation: Pre-oxygenation with 100% oxygen Intubation Type: IV induction Ventilation: Mask ventilation without difficulty Laryngoscope Size: Miller and 3 Grade View: Grade I Tube type: Oral Tube size: 8.0 mm Number of attempts: 1 Airway Equipment and Method: Stylet Placement Confirmation: ETT inserted through vocal cords under direct vision,  positive ETCO2 and breath sounds checked- equal and bilateral Secured at: 22 cm Tube secured with: Tape Dental Injury: Teeth and Oropharynx as per pre-operative assessment

## 2015-09-11 MED ORDER — DOCUSATE SODIUM 100 MG PO CAPS
100.0000 mg | ORAL_CAPSULE | Freq: Two times a day (BID) | ORAL | Status: DC
  Administered 2015-09-11 – 2015-09-12 (×3): 100 mg via ORAL
  Filled 2015-09-11 (×3): qty 1

## 2015-09-11 NOTE — Progress Notes (Signed)
Occupational Therapy Treatment Patient Details Name: Keith Webb MRN: [id number] DOB: 1951-04-20 Today's Date: 09/11/2015    History of present illness 65 year old male s/p for L4-5 decompressive laminectomy, L3-4, L4-5 posterior lateral fusion, removal of biomet screws L5-S1, coritcal pedicle screws L3-5    OT comments  Pt. Making great gains with OT.  All goals met and pt. Clear for d/c from OT.  Will alert OTR/L to sign off.    Follow Up Recommendations  No OT follow up;Supervision - Intermittent    Equipment Recommendations  3 in 1 bedside comode;Other (comment)    Recommendations for Other Services      Precautions / Restrictions Precautions Precautions: Back Precaution Comments: 1/3 at beginning of session, 2/3 at end of session (cues for no bending) Required Braces or Orthoses: Spinal Brace Spinal Brace: Lumbar corset       Mobility Bed Mobility Overal bed mobility: Modified Independent Bed Mobility: Rolling;Sidelying to Sit Rolling: Modified independent (Device/Increase time) Sidelying to sit: Modified independent (Device/Increase time)       General bed mobility comments: initial verbal instruction for log roll, but able to demonstrate without any assistance.  exits from right side of bed at home  Transfers Overall transfer level: Needs assistance Equipment used: Rolling walker (2 wheeled) Transfers: Sit to/from [facility] Sit to Stand: Supervision Stand pivot transfers: Supervision            Balance                                   ADL Overall ADL's : Needs assistance/impaired     Grooming: Standing;Supervision/safety         Lower Body Bathing Details (indicate cue type and reason): provided demo of A/E but pt. does not need it, able to cross L/R leg over knee and reach B LES Upper Body Dressing : Set up;Sitting Upper Body Dressing Details (indicate cue type and reason): donning brace   Lower Body Dressing  Details (indicate cue type and reason): provided demo of A/E but pt. does not need it, able to cross L/R leg over knee and reach B LES Toilet Transfer: Supervision/safety;RW;Ambulation;BSC;Regular Toilet   Toileting- Clothing Manipulation and Hygiene: Supervision/safety;Sit to/from stand   Tub/ Shower Transfer: Walk-in shower;Supervision/safety;Rolling walker;Anterior/posterior Tub/Shower Transfer Details (indicate cue type and reason): able to step over ledge in walk in shower with S Functional mobility during ADLs: Supervision/safety;Rolling walker General ADL Comments: performing all ADLS and funcitonal mobility very well, clear for d/c from OT      Vision                     Perception     Praxis      Cognition   Behavior During Therapy: [facility] for tasks assessed/performed Overall Cognitive Status: Within Functional Limits for tasks assessed                       Extremity/Trunk Assessment               Exercises     Shoulder Instructions       General Comments      Pertinent Vitals/ Pain       Pain Assessment: No/denies pain  Home Living  Prior Functioning/Environment              Frequency Min 2X/week     Progress Toward Goals  OT Goals(current goals can now be found in the care plan section)  Progress towards OT goals: Goals met/education completed, patient discharged from [facility] Discharge plan remains appropriate    Co-evaluation                 End of Session Equipment Utilized During Treatment: Gait belt;Rolling walker;Back brace   Activity Tolerance Patient tolerated treatment well   Patient Left in chair;with call bell/phone within reach   Nurse Communication          Time: 2297-9892 OT Time Calculation (min): 21 min  Charges: OT General Charges $OT Visit: 1 Procedure OT Treatments $Self Care/Home Management : 8-22 mins  Janice Coffin, COTA/L 09/11/2015, 9:40 AM

## 2015-09-11 NOTE — Progress Notes (Signed)
Physical Therapy Treatment Patient Details Name: Keith Webb MRN: [id number] DOB: 1950-08-04 Today's Date: 09/11/2015    History of Present Illness 65 year old male s/p for L4-5 decompressive laminectomy, L3-4, L4-5 posterior lateral fusion, removal of biomet screws L5-S1, coritcal pedicle screws L3-5     PT Comments    Pt moving well.  Able to ambulate some without RW, but increase in antalgic gait.  Discussed with pt and he still would like RW for longer distances.  Initiated stair training. Needs frequent cueing to avoid twisting with mobility.    Follow Up Recommendations  No PT follow up;Supervision for mobility/OOB     Equipment Recommendations  3in1 (PT);Rolling walker with 5" wheels    Recommendations for Other Services       Precautions / Restrictions Precautions Precautions: Back Precaution Comments: Recalled all precautions- verbal cues to follow with mobility Required Braces or Orthoses: Spinal Brace Spinal Brace: Lumbar corset Restrictions Weight Bearing Restrictions: No    Mobility  Bed Mobility Overal bed mobility: Modified Independent Bed Mobility: Rolling;Sidelying to Sit Rolling: Modified independent (Device/Increase time) Sidelying to sit: Modified independent (Device/Increase time)          Transfers Overall transfer level: Needs assistance Equipment used: Rolling walker (2 wheeled) Transfers: Sit to/from Stand Sit to Stand: Supervision         General transfer comment: Vcs for hand placement  Ambulation/Gait Ambulation/Gait assistance: Supervision Ambulation Distance (Feet): 275 Feet Assistive device: Rolling walker (2 wheeled);None Gait Pattern/deviations: Decreased stride length;Antalgic Gait velocity: WNL   General Gait Details: Amb first half with RW then with IV pole and eventually without UE support with increase in antalgic gait pattern.    Stairs Stairs: Yes Stairs assistance: Supervision Stair Management: One rail  Right;Alternating pattern;Step to pattern;Forwards Number of Stairs: 4 General stair comments: Limited by IV.  Step through pattern on ascent and step to pattern with descent.  Wheelchair Mobility    Modified Rankin (Stroke Patients Only)       Balance Overall balance assessment: No apparent balance deficits (not formally assessed)                                  Cognition Arousal/Alertness: Awake/alert Behavior During Therapy: WFL for tasks assessed/performed Overall Cognitive Status: Within Functional Limits for tasks assessed                      Exercises      General Comments        Pertinent Vitals/Pain Pain Assessment: Faces Faces Pain Scale: Hurts a little bit Pain Descriptors / Indicators: Operative site guarding    Home Living                      Prior Function            PT Goals (current goals can now be found in the care plan section) Acute Rehab PT Goals Patient Stated Goal: to go home PT Goal Formulation: With patient Time For Goal Achievement: 09/24/15 Potential to Achieve Goals: Good Progress towards PT goals: Progressing toward goals    Frequency  Min 5X/week    PT Plan Current plan remains appropriate    Co-evaluation             End of Session Equipment Utilized During Treatment: Gait belt;Back brace Activity Tolerance: Patient tolerated treatment well Patient left: in bed;with call  bell/phone within reach;with nursing/sitter in room     Time: 1413-1431 PT Time Calculation (min) (ACUTE ONLY): 18 min  Charges:  $Gait Training: 8-22 mins                    G Codes:      Keith Webb 09/11/2015, 3:51 PM

## 2015-09-11 NOTE — Progress Notes (Signed)
Subjective: Patient reports doing OK  Objective: Vital signs in last 24 hours: Temp:  [97 F (36.1 C)-98.5 F (36.9 C)] 97.5 F (36.4 C) (04/01 0537) Pulse Rate:  [59-91] 91 (04/01 0537) Resp:  [11-32] 18 (04/01 0537) BP: (120-132)/(70-94) 129/83 mmHg (04/01 0537) SpO2:  [100 %] 100 % (04/01 0537) Weight:  [92.534 kg (204 lb)] 92.534 kg (204 lb) (03/31 1029)  Intake/Output from previous day: 03/31 0701 - 04/01 0700 In: 2053 [I.V.:1803; IV Piggyback:250] Out: 42 [Urine:190; Drains:150; Blood:250] Intake/Output this shift: Total I/O In: -  Out: 300 [Urine:300]  Physical Exam: Strength full.  Dressing CDI.  Lab Results: No results for input(s): WBC, HGB, HCT, PLT in the last 72 hours. BMET No results for input(s): NA, K, CL, CO2, GLUCOSE, BUN, CREATININE, CALCIUM in the last 72 hours.  Studies/Results: Dg Lumbar Spine 2-3 Views  09/10/2015  CLINICAL DATA:  65 year old male for L3-L5 fusion. EXAM: LUMBAR SPINE - 2-3 VIEW; DG C-ARM 61-120 MIN COMPARISON:  None. FINDINGS: Frontal lateral intraoperative views of the lower lumbar spine are submitted postoperatively for interpretation. Posterior rod and bipedicular screw fixation at L3-L4-L5 noted. Interbody fusion material L5-S1 identified. No definite complicating features noted. IMPRESSION: Posterior fusion and hardware from L3-L5 without definite complicating features. Electronically Signed   By: Margarette Canada M.D.   On: 09/10/2015 13:57   Dg C-arm 1-60 Min  09/10/2015  CLINICAL DATA:  65 year old male for L3-L5 fusion. EXAM: LUMBAR SPINE - 2-3 VIEW; DG C-ARM 61-120 MIN COMPARISON:  None. FINDINGS: Frontal lateral intraoperative views of the lower lumbar spine are submitted postoperatively for interpretation. Posterior rod and bipedicular screw fixation at L3-L4-L5 noted. Interbody fusion material L5-S1 identified. No definite complicating features noted. IMPRESSION: Posterior fusion and hardware from L3-L5 without definite  complicating features. Electronically Signed   By: Margarette Canada M.D.   On: 09/10/2015 13:57    Assessment/Plan: Mobilize in brace with PT.  Doing well POD 1.  Continue drain this AM.    LOS: 1 day    Kenyon Eshleman D, MD 09/11/2015, 7:50 AM

## 2015-09-12 MED ORDER — OXYCODONE-ACETAMINOPHEN 5-325 MG PO TABS: 1.0000 | ORAL_TABLET | ORAL | Status: DC | PRN

## 2015-09-12 MED ORDER — DOCUSATE SODIUM 100 MG PO CAPS: 100.0000 mg | ORAL_CAPSULE | Freq: Two times a day (BID) | ORAL | Status: DC

## 2015-09-12 MED ORDER — METHOCARBAMOL 500 MG PO TABS: 500.0000 mg | ORAL_TABLET | Freq: Four times a day (QID) | ORAL | Status: DC | PRN

## 2015-09-12 MED ORDER — POLYETHYLENE GLYCOL 3350 17 G PO PACK
17.0000 g | PACK | Freq: Every day | ORAL | Status: DC
  Administered 2015-09-12: 17 g via ORAL
  Filled 2015-09-12: qty 1

## 2015-09-12 NOTE — Discharge Summary (Signed)
Date of Admission: 09/10/15  Date of Discharge: 09/12/15  PRE-OPERATIVE DIAGNOSIS: Lumbar spondylosis with adjacent level stenosis and degenerative disc disease, back and leg pain  POST-OPERATIVE DIAGNOSIS: same  PROCEDURE:  1. Decompressive lumbar laminectomy L4-5 left 2. Posterior fixation L3-5 using nuvasive cortical screws.  3. Intertransverse arthrodesis L3-5 using morcellized autograft and allograft. 4. Removal of instrumentation L5-S1   Attending: [facility] Course:  Admitted for above operation.  Uncomplicated course.  Discharged in stable condition.  Discharged Medications: Percocet  Follow up: 2-3 weeks

## 2015-09-12 NOTE — Progress Notes (Signed)
Discharge instructions reviewed with patient and family.  All questions answered.  Pt discharged to home.

## 2015-09-12 NOTE — Progress Notes (Signed)
Physical Therapy Treatment Patient Details Name: Keith Webb MRN: [id number] DOB: November 14, 1950 Today's Date: 09/12/2015    History of Present Illness 65 year old male s/p for L4-5 decompressive laminectomy, L3-4, L4-5 posterior lateral fusion, removal of biomet screws L5-S1, coritcal pedicle screws L3-5     PT Comments    Continues to progress very well. Safely navigates flight of steps without difficulty using rail for support. Ambulates without loss of balance or need for physical assistance. Adequate for d/c from PT standpoint when medically ready.  Follow Up Recommendations  No PT follow up;Supervision for mobility/OOB     Equipment Recommendations  3in1 (PT)    Recommendations for Other Services       Precautions / Restrictions Precautions Precautions: Back Precaution Comments: Recalled all precautions Required Braces or Orthoses: Spinal Brace Spinal Brace: Lumbar corset Restrictions Weight Bearing Restrictions: No    Mobility  Bed Mobility Overal bed mobility: Modified Independent Bed Mobility: Rolling;Sidelying to Sit Rolling: Modified independent (Device/Increase time) Sidelying to sit: Modified independent (Device/Increase time)       General bed mobility comments: no assist needed.  Transfers Overall transfer level: Needs assistance Equipment used: Rolling walker (2 wheeled) Transfers: Sit to/from Stand Sit to Stand: Supervision         General transfer comment: VC for hand placement. Good power up from bed.  Ambulation/Gait Ambulation/Gait assistance: Supervision Ambulation Distance (Feet): 250 Feet Assistive device: Rolling walker (2 wheeled);None Gait Pattern/deviations: Step-through pattern;Decreased stride length (Lateral sway) Gait velocity: WNL   General Gait Details: Good stability, half distance with RW, half without assistive device. No overt loss of balance. Demonstrates lateral sway but stable. No physical assist  needed   Stairs Stairs: Yes Stairs assistance: Supervision Stair Management: One rail Right;Step to pattern;Alternating pattern;Forwards Number of Stairs: 18 General stair comments: Safely navigates stairs. VC for sequencing. No buckling noted. states he feels confident with this task.  Wheelchair Mobility    Modified Rankin (Stroke Patients Only)       Balance                                    Cognition Arousal/Alertness: Awake/alert Behavior During Therapy: WFL for tasks assessed/performed Overall Cognitive Status: Within Functional Limits for tasks assessed                      Exercises      General Comments        Pertinent Vitals/Pain Pain Assessment: Faces Faces Pain Scale: Hurts a little bit Pain Location: back Pain Descriptors / Indicators: Sore Pain Intervention(s): Monitored during session;Repositioned    Home Living                      Prior Function            PT Goals (current goals can now be found in the care plan section) Acute Rehab PT Goals Patient Stated Goal: to go home PT Goal Formulation: With patient Time For Goal Achievement: 09/24/15 Potential to Achieve Goals: Good Progress towards PT goals: Progressing toward goals    Frequency  Min 5X/week    PT Plan Current plan remains appropriate    Co-evaluation             End of Session Equipment Utilized During Treatment: Back brace Activity Tolerance: Patient tolerated treatment well Patient left: with call bell/phone within reach;in chair;with  chair alarm set     Time: [id number] PT Time Calculation (min) (ACUTE ONLY): 11 min  Charges:  $Gait Training: 8-22 mins                    G Codes:      Ellouise Newer 09/20/2015, 12:45 PM  Camille Bal [place], [place]

## 2015-10-12 DIAGNOSIS — I10 Essential (primary) hypertension: Secondary | ICD-10-CM | POA: Diagnosis not present

## 2015-10-12 DIAGNOSIS — E785 Hyperlipidemia, unspecified: Secondary | ICD-10-CM | POA: Diagnosis not present

## 2015-10-12 DIAGNOSIS — R600 Localized edema: Secondary | ICD-10-CM | POA: Diagnosis not present

## 2015-10-12 DIAGNOSIS — I351 Nonrheumatic aortic (valve) insufficiency: Secondary | ICD-10-CM | POA: Diagnosis not present

## 2015-10-19 DIAGNOSIS — E782 Mixed hyperlipidemia: Secondary | ICD-10-CM | POA: Diagnosis not present

## 2015-10-19 DIAGNOSIS — D642 Secondary sideroblastic anemia due to drugs and toxins: Secondary | ICD-10-CM | POA: Diagnosis not present

## 2015-10-19 DIAGNOSIS — I10 Essential (primary) hypertension: Secondary | ICD-10-CM | POA: Diagnosis not present

## 2015-10-19 DIAGNOSIS — C61 Malignant neoplasm of prostate: Secondary | ICD-10-CM | POA: Diagnosis not present

## 2015-10-25 DIAGNOSIS — M4806 Spinal stenosis, lumbar region: Secondary | ICD-10-CM | POA: Diagnosis not present

## 2015-10-28 DIAGNOSIS — C61 Malignant neoplasm of prostate: Secondary | ICD-10-CM | POA: Diagnosis not present

## 2015-11-01 DIAGNOSIS — N281 Cyst of kidney, acquired: Secondary | ICD-10-CM | POA: Diagnosis not present

## 2015-11-01 DIAGNOSIS — C61 Malignant neoplasm of prostate: Secondary | ICD-10-CM | POA: Diagnosis not present

## 2015-11-01 DIAGNOSIS — N5231 Erectile dysfunction following radical prostatectomy: Secondary | ICD-10-CM | POA: Diagnosis not present

## 2015-11-01 DIAGNOSIS — Z0000 Encounter for general adult medical examination without abnormal findings: Secondary | ICD-10-CM | POA: Diagnosis not present

## 2015-12-10 DIAGNOSIS — H8309 Labyrinthitis, unspecified ear: Secondary | ICD-10-CM | POA: Diagnosis not present

## 2015-12-10 DIAGNOSIS — I10 Essential (primary) hypertension: Secondary | ICD-10-CM | POA: Diagnosis not present

## 2015-12-10 DIAGNOSIS — R61 Generalized hyperhidrosis: Secondary | ICD-10-CM | POA: Diagnosis not present

## 2015-12-10 DIAGNOSIS — E783 Hyperchylomicronemia: Secondary | ICD-10-CM | POA: Diagnosis not present

## 2015-12-20 DIAGNOSIS — H8309 Labyrinthitis, unspecified ear: Secondary | ICD-10-CM | POA: Diagnosis not present

## 2015-12-20 DIAGNOSIS — E782 Mixed hyperlipidemia: Secondary | ICD-10-CM | POA: Diagnosis not present

## 2016-01-25 DIAGNOSIS — C61 Malignant neoplasm of prostate: Secondary | ICD-10-CM | POA: Diagnosis not present

## 2016-01-31 DIAGNOSIS — C61 Malignant neoplasm of prostate: Secondary | ICD-10-CM | POA: Diagnosis not present

## 2016-01-31 DIAGNOSIS — N5201 Erectile dysfunction due to arterial insufficiency: Secondary | ICD-10-CM | POA: Diagnosis not present

## 2016-01-31 DIAGNOSIS — M545 Low back pain: Secondary | ICD-10-CM | POA: Diagnosis not present

## 2016-01-31 DIAGNOSIS — N400 Enlarged prostate without lower urinary tract symptoms: Secondary | ICD-10-CM | POA: Diagnosis not present

## 2016-01-31 DIAGNOSIS — Z6830 Body mass index (BMI) 30.0-30.9, adult: Secondary | ICD-10-CM | POA: Diagnosis not present

## 2016-02-18 DIAGNOSIS — D642 Secondary sideroblastic anemia due to drugs and toxins: Secondary | ICD-10-CM | POA: Diagnosis not present

## 2016-02-18 DIAGNOSIS — I10 Essential (primary) hypertension: Secondary | ICD-10-CM | POA: Diagnosis not present

## 2016-02-18 DIAGNOSIS — E782 Mixed hyperlipidemia: Secondary | ICD-10-CM | POA: Diagnosis not present

## 2016-02-18 DIAGNOSIS — H8309 Labyrinthitis, unspecified ear: Secondary | ICD-10-CM | POA: Diagnosis not present

## 2016-03-15 DIAGNOSIS — K610 Anal abscess: Secondary | ICD-10-CM | POA: Diagnosis not present

## 2016-04-26 DIAGNOSIS — K610 Anal abscess: Secondary | ICD-10-CM | POA: Diagnosis not present

## 2016-04-28 DIAGNOSIS — C61 Malignant neoplasm of prostate: Secondary | ICD-10-CM | POA: Diagnosis not present

## 2016-05-08 DIAGNOSIS — N5201 Erectile dysfunction due to arterial insufficiency: Secondary | ICD-10-CM | POA: Diagnosis not present

## 2016-05-08 DIAGNOSIS — C61 Malignant neoplasm of prostate: Secondary | ICD-10-CM | POA: Diagnosis not present

## 2016-05-22 ENCOUNTER — Ambulatory Visit: Payer: Self-pay | Admitting: Surgery

## 2016-05-22 DIAGNOSIS — Z01818 Encounter for other preprocedural examination: Secondary | ICD-10-CM | POA: Diagnosis not present

## 2016-05-22 DIAGNOSIS — K603 Anal fistula: Secondary | ICD-10-CM | POA: Diagnosis not present

## 2016-05-22 NOTE — H&P (Signed)
Keith Webb 05/22/2016 8:44 AM Location: [facility] Patient #: [id number] DOB: [birth year] Married / Language: English / Race: Black or African American Male  Patient Care Team: Lucianne Lei, MD as PCP - General (Family Medicine) Michael Boston, MD as Consulting Physician (General Surgery) Wilford Corner, MD as Consulting Physician (Gastroenterology)   History of Present Illness Keith Hector MD; 05/22/2016 2:10 PM) The patient is a 65 year old male who presents with anal fistula. Note for "Anal fistula": Patient sent for surgical consultation at the request of his gastroenterologist, Dr. Carol Ada up. Concern for recurrent abscesses and possible anal fistula.  Pleasant 65 year old male. Has had intermittent episodes of swelling and pain perianally. Usually they spontaneously drain. No interventions or surgeries. Had a colonoscopy 2011 which showed some large hemorrhoids but otherwise negative. Because of recurrent episodes of infection, fistula suspected. Recommendation for surgical consultation.  Patient moves his bowels about 2 or 3 times a day. He hasn't smoked in over a decade. He can walk about 3 miles without difficulty. No history of heart attacks or strokes or other cardiac issues. No personal nor family history of GI/colon cancer, inflammatory bowel disease, irritable bowel syndrome, allergy such as Celiac Sprue, dietary/dairy problems, colitis, ulcers nor gastritis. No recent sick contacts/gastroenteritis. No travel outside the country. No changes in diet. No dysphagia to solids or liquids. No significant heartburn or reflux. No hematochezia, hematemesis, coffee ground emesis. No evidence of prior gastric/peptic ulceration.   Allergies Nance Pear, [place]; 05/22/2016 8:45 AM) No Known Drug Allergies 05/22/2016  Medication History Nance Pear, [place]; 05/22/2016 8:46 AM) Oxycodone-Acetaminophen (5-325MG  Tablet, Oral as needed)  Active. AmLODIPine Besylate (10MG  Tablet, Oral daily) Active. Clotrimazole-Betamethasone (1-0.05% Cream, External daily) Active. Esomeprazole Magnesium (40MG  Capsule DR, Oral daily) Active. Ezetimibe-Simvastatin (10-40MG  Tablet, Oral daily) Active. Furosemide (40MG  Tablet, Oral daily) Active. Losartan Potassium (100MG  Tablet, Oral daily) Active. GuanFACINE HCl (1MG  Tablet, Oral daily) Active. Meloxicam (15MG  Tablet, Oral daily) Active. Meclizine HCl (25MG  Tablet, Oral daily) Active. Montelukast Sodium (10MG  Tablet, Oral daily) Active. Methocarbamol (500MG  Tablet, Oral daily) Active. Zorvolex (35MG  Capsule, Oral daily) Active. Iron (18MG  Tablet ER, Oral daily) Active. Multi-Minerals (Oral daily) Active. Medications Reconciled    Vitals (Sade Bradford CMA; 05/22/2016 8:47 AM) 05/22/2016 8:46 AM Weight: 203.6 lb Height: 68in Body Surface Area: 2.06 m Body Mass Index: 30.96 kg/m  Temp.: 98.70F  Pulse: 75 (Regular)  BP: 132/92 (Sitting, Left Arm, Standard)      Physical Exam Keith Hector MD; 05/22/2016 9:25 AM)  General Mental Status-Alert. General Appearance-Not in acute distress, Not Sickly. Orientation-Oriented X3. Hydration-Well hydrated. Voice-Normal.  Integumentary Global Assessment Upon inspection and palpation of skin surfaces of the - Axillae: non-tender, no inflammation or ulceration, no drainage. and Distribution of scalp and body hair is normal. General Characteristics Temperature - normal warmth is noted.  Head and Neck Head-normocephalic, atraumatic with no lesions or palpable masses. Face Global Assessment - atraumatic, no absence of expression. Neck Global Assessment - no abnormal movements, no bruit auscultated on the right, no bruit auscultated on the left, no decreased range of motion, non-tender. Trachea-midline. Thyroid Gland Characteristics - non-tender.  Eye Eyeball - Left-Extraocular movements  intact, No Nystagmus. Eyeball - Right-Extraocular movements intact, No Nystagmus. Cornea - Left-No Hazy. Cornea - Right-No Hazy. Sclera/Conjunctiva - Left-No scleral icterus, No Discharge. Sclera/Conjunctiva - Right-No scleral icterus, No Discharge. Pupil - Left-Direct reaction to light normal. Pupil - Right-Direct reaction to light normal.  ENMT Ears Pinna - Left - no drainage observed,  no generalized tenderness observed. Right - no drainage observed, no generalized tenderness observed. Nose and Sinuses External Inspection of the Nose - no destructive lesion observed. Inspection of the nares - Left - quiet respiration. Right - quiet respiration. Mouth and Throat Lips - Upper Lip - no fissures observed, no pallor noted. Lower Lip - no fissures observed, no pallor noted. Nasopharynx - no discharge present. Oral Cavity/Oropharynx - Tongue - no dryness observed. Oral Mucosa - no cyanosis observed. Hypopharynx - no evidence of airway distress observed.  Chest and Lung Exam Inspection Movements - Normal and Symmetrical. Accessory muscles - No use of accessory muscles in breathing. Palpation Palpation of the chest reveals - Non-tender. Auscultation Breath sounds - Normal and Clear.  Cardiovascular Auscultation Rhythm - Regular. Murmurs & Other Heart Sounds - Auscultation of the heart reveals - No Murmurs and No Systolic Clicks.  Abdomen Inspection Inspection of the abdomen reveals - No Visible peristalsis and No Abnormal pulsations. Umbilicus - No Bleeding, No Urine drainage. Palpation/Percussion Palpation and Percussion of the abdomen reveal - Soft, Non Tender, No Rebound tenderness, No Rigidity (guarding) and No Cutaneous hyperesthesia. Note: Low midline incision without hernia. Abdomen soft. Nontender, nondistended. No guarding. No diastasis. No umbilical nor other hernias  Male Genitourinary Sexual Maturity Tanner 5 - Adult hair pattern and Adult penile size and  shape. Note: No inguinal hernias. Normal external genitalia. Epididymi, testes, and spermatic cords normal without any masses.  Rectal Note: Left lateral punctate sinus with inflammation. Can almost feel a cord going towards posterior midline. Highly suspicious for a fistula.    Perianal skin clean with good hygiene. No pruritis ani. No pilonidal disease. No fissure. Normal sphincter tone.  No external hemorrhoids. No condyloma warts.  Tolerates digital and anoscopic rectal exam. No rectal masses. Hemorrhoidal piles grade 2 mildly enlarged but no active bleeding or prolapse.  Peripheral Vascular Upper Extremity Inspection - Left - No Cyanotic nailbeds, Not Ischemic. Right - No Cyanotic nailbeds, Not Ischemic.  Neurologic Neurologic evaluation reveals -normal attention span and ability to concentrate, able to name objects and repeat phrases. Appropriate fund of knowledge , normal sensation and normal coordination. Mental Status Affect - not angry, not paranoid. Cranial Nerves-Normal Bilaterally. Gait-Normal.  Neuropsychiatric Mental status exam performed with findings of-able to articulate well with normal speech/language, rate, volume and coherence, thought content normal with ability to perform basic computations and apply abstract reasoning and no evidence of hallucinations, delusions, obsessions or homicidal/suicidal ideation.  Musculoskeletal Global Assessment Spine, Ribs and Pelvis - no instability, subluxation or laxity. Right Upper Extremity - no instability, subluxation or laxity.  Lymphatic Head & Neck  General Head & Neck Lymphatics: Bilateral - Description - No Localized lymphadenopathy. Axillary  General Axillary Region: Bilateral - Description - No Localized lymphadenopathy. Femoral & Inguinal  Generalized Femoral & Inguinal Lymphatics: Left - Description - No Localized lymphadenopathy. Right - Description - No Localized  lymphadenopathy.   Results Keith Hector MD; 05/22/2016 2:11 PM) Procedures  Name Value Date Hemorrhoids Procedure Internal exam: Internal Hemorroids ( non-bleeding) Other: Left lateral punctate sinus with inflammation. Can almost feel a cord going towards posterior midline. Highly suspicious for a fistula. Perianal skin clean with good hygiene. No pruritis ani. No pilonidal disease. No fissure. Normal sphincter tone.  No external hemorrhoids. No condyloma warts. Tolerates digital and anoscopic rectal exam. No rectal masses. Hemorrhoidal piles grade 2 mildly enlarged but no active bleeding or prolapse.  Performed: 05/22/2016 9:25 AM    Assessment & Plan (  Keith Hector MD; 05/22/2016 2:11 PM)  ANAL FISTULA (K60.3) Impression: History of intermittent perirectal abscesses now with chronic sinus very suspicious for perirectal fistula.  I do not think this will resolve without surgery. Recommended examination under anesthesia with either superficial fistulotomy versus layered LIF T repair. Outpatient surgery. While he was not excited to hear he needs surgery, he agrees with the recommendations. We'll work to set up convenient time.  Current Plans ANOSCOPY, DIAGNOSTIC [id number]) Pt Education - [facility] Abscess/Fistula (AT): discussed with patient and provided information. The anatomy & physiology of the anorectal region was discussed. We discussed the pathophysiology of anorectal abscess and fistula. Differential diagnosis was discussed. Natural history progression was discussed. I stressed the importance of a bowel regimen to have daily soft bowel movements to minimize progression of disease.  The patient's condition is not adequately controlled. Non-operative treatment has not healed the fistula. Therefore, I recommended examination under anaesthesia to confirm the diagnosis and treat the fistula. I discussed techniques that may be required such as fistulotomy, ligation  by LIFT technique, and/or seton placement. Benefits & alternatives discussed. I noted a good likelihood this will help address the problem, but sometimes repeat operations and prolonged healing times may occur. Risks such as bleeding, pain, recurrence, reoperation, incontinence, heart attack, death, and other risks were discussed.  Educational handouts further explaining the pathology, treatment options, and bowel regimen were given. The patient expressed understanding & wishes to proceed. We will work to coordinate surgery for a mutually convenient time.  ENCOUNTER FOR PREOPERATIVE EXAMINATION FOR GENERAL SURGICAL PROCEDURE (Z01.818)  Current Plans You are being scheduled for surgery- Our schedulers will call you.  You should hear from our office's scheduling department within 5 working days about the location, date, and time of surgery. We try to make accommodations for patient's preferences in scheduling surgery, but sometimes the OR schedule or the surgeon's schedule prevents [place] from making those accommodations.  If you have not heard from our office [phone]) in 5 working days, call the office and ask for your surgeon's nurse.  If you have other questions about your diagnosis, plan, or surgery, call the office and ask for your surgeon's nurse.  Pt Education - [facility] Rectal Prep for Anorectal outpatient/office surgery: discussed with patient and provided information. Pt Education - [facility] Rectal Surgery HCI (Beaulah Romanek): discussed with patient and provided information. Pt Education - [facility] Hemorrhoids ([facility]ara Ent): discussed with patient and provided information.  Keith Webb, M.D., F.A.C.S. Gastrointestinal and Minimally Invasive Surgery [facility] [address], [place], [place] [address] [phone] Main / Paging

## 2016-05-25 DIAGNOSIS — S83242A Other tear of medial meniscus, current injury, left knee, initial encounter: Secondary | ICD-10-CM | POA: Diagnosis not present

## 2016-05-25 DIAGNOSIS — Z96642 Presence of left artificial hip joint: Secondary | ICD-10-CM | POA: Diagnosis not present

## 2016-06-19 DIAGNOSIS — R7309 Other abnormal glucose: Secondary | ICD-10-CM | POA: Diagnosis not present

## 2016-06-19 DIAGNOSIS — Z6831 Body mass index (BMI) 31.0-31.9, adult: Secondary | ICD-10-CM | POA: Diagnosis not present

## 2016-06-19 DIAGNOSIS — E782 Mixed hyperlipidemia: Secondary | ICD-10-CM | POA: Diagnosis not present

## 2016-06-19 DIAGNOSIS — M545 Low back pain: Secondary | ICD-10-CM | POA: Diagnosis not present

## 2016-06-19 DIAGNOSIS — I10 Essential (primary) hypertension: Secondary | ICD-10-CM | POA: Diagnosis not present

## 2016-07-21 ENCOUNTER — Encounter (HOSPITAL_BASED_OUTPATIENT_CLINIC_OR_DEPARTMENT_OTHER): Payer: Self-pay | Admitting: *Deleted

## 2016-07-21 NOTE — Progress Notes (Addendum)
NPO AFTER MN.  ARRIVE AT 0600.  NEEDS ISTAT 8.  CURRENT EKG IN CHART AND EPIC.  WILL TAKE COZAAR, NORVASC, AND DEXILANT AM DOS W/ SIPS OF WATER.  WILL DO HIBICLENS SHOWER HS BEFORE AND AM DOS.  PT STATES WAS NOT AT [place] FROM OFFICE.  PT TO CALL BACK ON Monday TO CONFIRM HE HAS THEM AND VERBALIZED UNDERSTANDING.  ADDENDUM:  PT CALLED BACK STATED HE DID HAVE BOWEL PREP INSTRUCTIONS AND VERBALIZED UNDERSTANDING.

## 2016-07-26 NOTE — Anesthesia Preprocedure Evaluation (Addendum)
Anesthesia Evaluation  Patient identified by MRN, date of birth, ID band Patient awake    Reviewed: Allergy & Precautions, NPO status , Patient's Chart, lab work & pertinent test results  History of Anesthesia Complications Negative for: history of anesthetic complications  Airway Mallampati: II  TM Distance: >3 FB Neck ROM: Full    Dental no notable dental hx. (+) Dental Advisory Given, Caps, Teeth Intact,    Pulmonary sleep apnea , former smoker,    Pulmonary exam normal        Cardiovascular hypertension, Pt. on medications negative cardio ROS Normal cardiovascular exam Rhythm:Regular Rate:Normal     Neuro/Psych negative neurological ROS  negative psych ROS   GI/Hepatic Neg liver ROS, GERD  Controlled,  Endo/Other  negative endocrine ROS  Renal/GU negative Renal ROS     Musculoskeletal negative musculoskeletal ROS (+) Arthritis ,   Abdominal   Peds  Hematology negative hematology ROS (+)   Anesthesia Other Findings Day of surgery medications reviewed with the patient.  Reproductive/Obstetrics                          Anesthesia Physical Anesthesia Plan  ASA: II  Anesthesia Plan: General   Post-op Pain Management:    Induction: Intravenous  Airway Management Planned: Oral ETT  Additional Equipment:   Intra-op Plan:   Post-operative Plan: Extubation in OR  Informed Consent:   Plan Discussed with:   Anesthesia Plan Comments:        Anesthesia Quick Evaluation

## 2016-07-27 ENCOUNTER — Encounter (HOSPITAL_BASED_OUTPATIENT_CLINIC_OR_DEPARTMENT_OTHER): Payer: Self-pay

## 2016-07-27 ENCOUNTER — Encounter (HOSPITAL_BASED_OUTPATIENT_CLINIC_OR_DEPARTMENT_OTHER)
Admission: RE | Disposition: A | Discharge status: Home / Self Care | Payer: Self-pay | Source: Ambulatory Visit | Attending: Surgery

## 2016-07-27 ENCOUNTER — Ambulatory Visit (HOSPITAL_BASED_OUTPATIENT_CLINIC_OR_DEPARTMENT_OTHER)
Admission: RE | Admit: 2016-07-27 | Discharge: 2016-07-27 | Disposition: A | Discharge status: Home / Self Care | Payer: BLUE CROSS/BLUE SHIELD | Source: Ambulatory Visit | Attending: Surgery | Admitting: Surgery

## 2016-07-27 ENCOUNTER — Ambulatory Visit (HOSPITAL_BASED_OUTPATIENT_CLINIC_OR_DEPARTMENT_OTHER): Payer: BLUE CROSS/BLUE SHIELD | Admitting: Anesthesiology

## 2016-07-27 DIAGNOSIS — K219 Gastro-esophageal reflux disease without esophagitis: Secondary | ICD-10-CM | POA: Insufficient documentation

## 2016-07-27 DIAGNOSIS — C61 Malignant neoplasm of prostate: Secondary | ICD-10-CM | POA: Diagnosis not present

## 2016-07-27 DIAGNOSIS — K642 Third degree hemorrhoids: Secondary | ICD-10-CM | POA: Diagnosis not present

## 2016-07-27 DIAGNOSIS — K604 Rectal fistula: Secondary | ICD-10-CM | POA: Diagnosis not present

## 2016-07-27 DIAGNOSIS — M1990 Unspecified osteoarthritis, unspecified site: Secondary | ICD-10-CM | POA: Diagnosis not present

## 2016-07-27 DIAGNOSIS — Z79899 Other long term (current) drug therapy: Secondary | ICD-10-CM | POA: Diagnosis not present

## 2016-07-27 DIAGNOSIS — Z87891 Personal history of nicotine dependence: Secondary | ICD-10-CM | POA: Diagnosis not present

## 2016-07-27 DIAGNOSIS — K648 Other hemorrhoids: Secondary | ICD-10-CM | POA: Diagnosis not present

## 2016-07-27 DIAGNOSIS — K643 Fourth degree hemorrhoids: Secondary | ICD-10-CM | POA: Diagnosis not present

## 2016-07-27 DIAGNOSIS — I10 Essential (primary) hypertension: Secondary | ICD-10-CM | POA: Diagnosis not present

## 2016-07-27 DIAGNOSIS — G4730 Sleep apnea, unspecified: Secondary | ICD-10-CM | POA: Insufficient documentation

## 2016-07-27 HISTORY — DX: Obstructive sleep apnea (adult) (pediatric): G47.33

## 2016-07-27 HISTORY — PX: RECTAL EXAM UNDER ANESTHESIA: SHX6399

## 2016-07-27 HISTORY — DX: Other allergic rhinitis: J30.89

## 2016-07-27 HISTORY — DX: Presence of spectacles and contact lenses: Z97.3

## 2016-07-27 HISTORY — DX: Malignant neoplasm of prostate: C61

## 2016-07-27 HISTORY — DX: Personal history of other specified conditions: Z87.898

## 2016-07-27 LAB — POCT I-STAT, CHEM 8
BUN: 17 mg/dL (ref 6–20)
CREATININE: 1.2 mg/dL (ref 0.61–1.24)
Calcium, Ion: 1.33 mmol/L (ref 1.15–1.40)
Chloride: 103 mmol/L (ref 101–111)
Glucose, Bld: 94 mg/dL (ref 65–99)
HCT: 38 % — ABNORMAL LOW (ref 39.0–52.0)
HEMOGLOBIN: 12.9 g/dL — AB (ref 13.0–17.0)
POTASSIUM: 4.2 mmol/L (ref 3.5–5.1)
Sodium: 138 mmol/L (ref 135–145)
TCO2: 26 mmol/L (ref 0–100)

## 2016-07-27 SURGERY — EXAM UNDER ANESTHESIA, RECTUM
Anesthesia: General | Site: Rectum

## 2016-07-27 MED ORDER — CEFOTETAN DISODIUM 2 G IJ SOLR
2.0000 g | INTRAMUSCULAR | Status: DC
  Filled 2016-07-27: qty 2

## 2016-07-27 MED ORDER — FENTANYL CITRATE (PF) 100 MCG/2ML IJ SOLN
INTRAMUSCULAR | Status: AC
  Filled 2016-07-27: qty 2

## 2016-07-27 MED ORDER — SCOPOLAMINE 1 MG/3DAYS TD PT72
MEDICATED_PATCH | TRANSDERMAL | Status: AC
  Filled 2016-07-27: qty 1

## 2016-07-27 MED ORDER — LACTATED RINGERS IV SOLN
INTRAVENOUS | Status: DC
  Administered 2016-07-27 (×3): via INTRAVENOUS
  Filled 2016-07-27: qty 1000

## 2016-07-27 MED ORDER — LIDOCAINE 2% (20 MG/ML) 5 ML SYRINGE
INTRAMUSCULAR | Status: DC | PRN
  Administered 2016-07-27: 100 mg via INTRAVENOUS

## 2016-07-27 MED ORDER — MIDAZOLAM HCL 2 MG/2ML IJ SOLN
INTRAMUSCULAR | Status: AC
  Filled 2016-07-27: qty 2

## 2016-07-27 MED ORDER — BUPIVACAINE HCL (PF) 0.25 % IJ SOLN
INTRAMUSCULAR | Status: AC
  Filled 2016-07-27: qty 30

## 2016-07-27 MED ORDER — BUPIVACAINE LIPOSOME 1.3 % IJ SUSP
20.0000 mL | INTRAMUSCULAR | Status: DC
  Filled 2016-07-27: qty 20

## 2016-07-27 MED ORDER — PROPOFOL 10 MG/ML IV BOLUS
INTRAVENOUS | Status: DC | PRN
Start: 2016-07-27 — End: 2016-07-27
  Administered 2016-07-27: 200 mg via INTRAVENOUS

## 2016-07-27 MED ORDER — DIBUCAINE 1 % RE OINT
TOPICAL_OINTMENT | RECTAL | Status: DC | PRN
  Administered 2016-07-27: 1 via RECTAL

## 2016-07-27 MED ORDER — OXYCODONE HCL 5 MG PO TABS: 5.0000 mg | ORAL_TABLET | ORAL | 0 refills | Status: DC | PRN

## 2016-07-27 MED ORDER — ACETAMINOPHEN 500 MG PO TABS
ORAL_TABLET | ORAL | Status: AC
  Filled 2016-07-27: qty 2

## 2016-07-27 MED ORDER — BUPIVACAINE LIPOSOME 1.3 % IJ SUSP
INTRAMUSCULAR | Status: AC
  Filled 2016-07-27: qty 20

## 2016-07-27 MED ORDER — GABAPENTIN 300 MG PO CAPS
ORAL_CAPSULE | ORAL | Status: AC
  Filled 2016-07-27: qty 1

## 2016-07-27 MED ORDER — FENTANYL CITRATE (PF) 100 MCG/2ML IJ SOLN
INTRAMUSCULAR | Status: DC | PRN
  Administered 2016-07-27: 50 ug via INTRAVENOUS

## 2016-07-27 MED ORDER — DEXAMETHASONE SODIUM PHOSPHATE 10 MG/ML IJ SOLN
INTRAMUSCULAR | Status: AC
  Filled 2016-07-27: qty 1

## 2016-07-27 MED ORDER — CHLORHEXIDINE GLUCONATE CLOTH 2 % EX PADS
6.0000 | MEDICATED_PAD | Freq: Once | CUTANEOUS | Status: DC
  Filled 2016-07-27: qty 6

## 2016-07-27 MED ORDER — ONDANSETRON HCL 4 MG/2ML IJ SOLN
INTRAMUSCULAR | Status: DC | PRN
  Administered 2016-07-27: 4 mg via INTRAVENOUS

## 2016-07-27 MED ORDER — BUPIVACAINE LIPOSOME 1.3 % IJ SUSP
INTRAMUSCULAR | Status: DC | PRN
  Administered 2016-07-27: 20 mL

## 2016-07-27 MED ORDER — CEFOTETAN DISODIUM-DEXTROSE 2-2.08 GM-% IV SOLR
INTRAVENOUS | Status: AC
  Filled 2016-07-27: qty 50

## 2016-07-27 MED ORDER — SODIUM CHLORIDE 0.9 % IJ SOLN
INTRAMUSCULAR | Status: AC
  Filled 2016-07-27: qty 50

## 2016-07-27 MED ORDER — PROPOFOL 10 MG/ML IV BOLUS
INTRAVENOUS | Status: AC
  Filled 2016-07-27: qty 40

## 2016-07-27 MED ORDER — EPINEPHRINE PF 1 MG/ML IJ SOLN
INTRAMUSCULAR | Status: AC
  Filled 2016-07-27: qty 1

## 2016-07-27 MED ORDER — DIBUCAINE 1 % RE OINT
TOPICAL_OINTMENT | RECTAL | Status: AC
  Filled 2016-07-27: qty 28

## 2016-07-27 MED ORDER — MIDAZOLAM HCL 5 MG/5ML IJ SOLN
INTRAMUSCULAR | Status: DC | PRN
  Administered 2016-07-27: 2 mg via INTRAVENOUS

## 2016-07-27 MED ORDER — HYDROMORPHONE HCL 1 MG/ML IJ SOLN
0.2500 mg | INTRAMUSCULAR | Status: DC | PRN
  Filled 2016-07-27: qty 0.5

## 2016-07-27 MED ORDER — SUCCINYLCHOLINE CHLORIDE 200 MG/10ML IV SOSY
PREFILLED_SYRINGE | INTRAVENOUS | Status: DC | PRN
  Administered 2016-07-27: 100 mg via INTRAVENOUS

## 2016-07-27 MED ORDER — ONDANSETRON HCL 4 MG/2ML IJ SOLN
INTRAMUSCULAR | Status: AC
  Filled 2016-07-27: qty 2

## 2016-07-27 MED ORDER — SCOPOLAMINE 1 MG/3DAYS TD PT72
1.0000 | MEDICATED_PATCH | TRANSDERMAL | Status: DC
  Administered 2016-07-27: 1.5 mg via TRANSDERMAL
  Filled 2016-07-27: qty 1

## 2016-07-27 MED ORDER — BUPIVACAINE-EPINEPHRINE 0.25% -1:200000 IJ SOLN
INTRAMUSCULAR | Status: DC | PRN
  Administered 2016-07-27: 30 mL

## 2016-07-27 MED ORDER — DEXAMETHASONE SODIUM PHOSPHATE 4 MG/ML IJ SOLN
INTRAMUSCULAR | Status: DC | PRN
  Administered 2016-07-27: 10 mg via INTRAVENOUS

## 2016-07-27 MED ORDER — GABAPENTIN 300 MG PO CAPS
300.0000 mg | ORAL_CAPSULE | ORAL | Status: AC
  Administered 2016-07-27: 300 mg via ORAL
  Filled 2016-07-27: qty 1

## 2016-07-27 MED ORDER — ACETAMINOPHEN 500 MG PO TABS
1000.0000 mg | ORAL_TABLET | ORAL | Status: AC
  Administered 2016-07-27: 1000 mg via ORAL
  Filled 2016-07-27: qty 2

## 2016-07-27 MED ORDER — PROMETHAZINE HCL 25 MG/ML IJ SOLN
6.2500 mg | INTRAMUSCULAR | Status: DC | PRN
  Filled 2016-07-27: qty 1

## 2016-07-27 SURGICAL SUPPLY — 68 items
APL SKNCLS STERI-STRIP NONHPOA (GAUZE/BANDAGES/DRESSINGS) ×2
BENZOIN TINCTURE PRP APPL 2/3 (GAUZE/BANDAGES/DRESSINGS) ×4 IMPLANT
BLADE CLIPPER SURG (BLADE) IMPLANT
BLADE HEX COATED 2.75 (ELECTRODE) ×4 IMPLANT
BLADE SURG 10 STRL SS (BLADE) IMPLANT
BLADE SURG 15 STRL LF DISP TIS (BLADE) ×2 IMPLANT
BLADE SURG 15 STRL SS (BLADE) ×4
BRIEF STRETCH FOR OB PAD LRG (UNDERPADS AND DIAPERS) ×4 IMPLANT
CANISTER SUCTION 1200CC (MISCELLANEOUS) ×4 IMPLANT
COVER BACK TABLE 60X90IN (DRAPES) ×4 IMPLANT
COVER MAYO STAND STRL (DRAPES) ×4 IMPLANT
DRAPE LAPAROTOMY 100X72 PEDS (DRAPES) ×4 IMPLANT
DRAPE LG THREE QUARTER DISP (DRAPES) IMPLANT
DRAPE UNDERBUTTOCKS STRL (DRAPE) ×4 IMPLANT
DRSG PAD ABDOMINAL 8X10 ST (GAUZE/BANDAGES/DRESSINGS) ×4 IMPLANT
ELECT NDL TIP 2.8 STRL (NEEDLE) IMPLANT
ELECT NEEDLE TIP 2.8 STRL (NEEDLE) IMPLANT
ELECT REM PT RETURN 9FT ADLT (ELECTROSURGICAL) ×4
ELECTRODE REM PT RTRN 9FT ADLT (ELECTROSURGICAL) ×2 IMPLANT
GAUZE SPONGE 4X4 12PLY STRL (GAUZE/BANDAGES/DRESSINGS) ×4 IMPLANT
GAUZE SPONGE 4X4 16PLY XRAY LF (GAUZE/BANDAGES/DRESSINGS) IMPLANT
GELFOAM 100 342 01 (MISCELLANEOUS) IMPLANT
GELFOAM SMALL 12 7MM 315 3 (MISCELLANEOUS) IMPLANT
GLOVE BIO SURGEON STRL SZ8 (GLOVE) ×4 IMPLANT
GLOVE ECLIPSE 8.0 STRL XLNG CF (GLOVE) ×4 IMPLANT
GLOVE INDICATOR 8.0 STRL GRN (GLOVE) ×4 IMPLANT
GOWN STRL REUS W/ TWL LRG LVL3 (GOWN DISPOSABLE) ×2 IMPLANT
GOWN STRL REUS W/TWL LRG LVL3 (GOWN DISPOSABLE) ×4
IV CATH 14GX2 1/4 (CATHETERS) IMPLANT
KIT ROOM TURNOVER WOR (KITS) ×4 IMPLANT
LEGGING LITHOTOMY PAIR STRL (DRAPES) IMPLANT
NDL HYPO 25X1 1.5 SAFETY (NEEDLE) ×1 IMPLANT
NDL SAFETY ECLIPSE 18X1.5 (NEEDLE) ×2 IMPLANT
NEEDLE HYPO 18GX1.5 SHARP (NEEDLE) ×4
NEEDLE HYPO 22GX1.5 SAFETY (NEEDLE) ×4 IMPLANT
NEEDLE HYPO 25X1 1.5 SAFETY (NEEDLE) ×4 IMPLANT
PACK BASIN DAY SURGERY FS (CUSTOM PROCEDURE TRAY) ×4 IMPLANT
PAD ABD 8X10 STRL (GAUZE/BANDAGES/DRESSINGS) ×4 IMPLANT
PAD PREP 24X48 CUFFED NSTRL (MISCELLANEOUS) ×4 IMPLANT
PENCIL BUTTON HOLSTER BLD 10FT (ELECTRODE) ×4 IMPLANT
SHEARS HARMONIC 9CM CVD (BLADE) IMPLANT
SPONGE GAUZE 4X4 12PLY STER LF (GAUZE/BANDAGES/DRESSINGS) ×4 IMPLANT
SPONGE SURGIFOAM ABS GEL 100 (HEMOSTASIS) IMPLANT
SPONGE SURGIFOAM ABS GEL 12-7 (HEMOSTASIS) IMPLANT
SURGILUBE 2OZ TUBE FLIPTOP (MISCELLANEOUS) ×4 IMPLANT
SUT CHROMIC 2 0 SH (SUTURE) ×4 IMPLANT
SUT CHROMIC 3 0 SH 27 (SUTURE) ×4 IMPLANT
SUT MNCRL AB 4-0 PS2 18 (SUTURE) IMPLANT
SUT PROLENE 2 0 SH DA (SUTURE) IMPLANT
SUT VIC AB 2-0 SH 27 (SUTURE)
SUT VIC AB 2-0 SH 27X BRD (SUTURE) IMPLANT
SUT VIC AB 2-0 UR6 27 (SUTURE) ×8 IMPLANT
SUT VIC AB 3-0 SH 18 (SUTURE) IMPLANT
SUT VICRYL 0 UR6 27IN ABS (SUTURE) IMPLANT
SWAB COLLECTION DEVICE MRSA (MISCELLANEOUS) IMPLANT
SYR 20CC LL (SYRINGE) ×4 IMPLANT
SYR BULB IRRIGATION 50ML (SYRINGE) ×4 IMPLANT
SYR CONTROL 10ML LL (SYRINGE) IMPLANT
TAPE HYPAFIX 4 X10 (GAUZE/BANDAGES/DRESSINGS) ×4 IMPLANT
TOWEL OR 17X24 6PK STRL BLUE (TOWEL DISPOSABLE) ×8 IMPLANT
TRAY DSU PREP LF (CUSTOM PROCEDURE TRAY) ×4 IMPLANT
TUBE ANAEROBIC SPECIMEN COL (MISCELLANEOUS) IMPLANT
TUBE CONNECTING 12'X1/4 (SUCTIONS) ×1
TUBE CONNECTING 12X1/4 (SUCTIONS) ×3 IMPLANT
UNDERPAD 30X30 INCONTINENT (UNDERPADS AND DIAPERS) ×8 IMPLANT
WATER STERILE IRR 500ML POUR (IV SOLUTION) ×4 IMPLANT
YANKAUER SUCT BULB TIP 10FT TU (MISCELLANEOUS) ×4 IMPLANT
YANKAUER SUCT BULB TIP NO VENT (SUCTIONS) ×4 IMPLANT

## 2016-07-27 NOTE — Anesthesia Procedure Notes (Signed)
Procedure Name: Intubation Date/Time: 07/27/2016 7:46 AM Performed by: Wanita Chamberlain Pre-anesthesia Checklist: Patient identified, Timeout performed, Emergency Drugs available, Suction available and Patient being monitored Patient Re-evaluated:Patient Re-evaluated prior to inductionOxygen Delivery Method: Circle system utilized Preoxygenation: Pre-oxygenation with 100% oxygen Intubation Type: IV induction Ventilation: Mask ventilation without difficulty Laryngoscope Size: Mac and 4 Grade View: Grade I Tube type: Oral Number of attempts: 1 Airway Equipment and Method: Stylet Placement Confirmation: positive ETCO2,  breath sounds checked- equal and bilateral and ETT inserted through vocal cords under direct vision Secured at: 22 cm Tube secured with: Tape Dental Injury: Teeth and Oropharynx as per pre-operative assessment

## 2016-07-27 NOTE — Discharge Instructions (Signed)
ANORECTAL SURGERY:  °POST OPERATIVE INSTRUCTIONS ° °###################################################################### ° °EAT °Gradually transition to a high fiber diet with a fiber supplement over the next few weeks after discharge.  Start with a pureed / full liquid diet (see below) ° °WALK °Walk an hour a day.  Control your pain to do that.   ° °CONTROL PAIN °Control pain so that you can walk, sleep, tolerate sneezing/coughing, go up/down stairs. ° °HAVE A BOWEL MOVEMENT DAILY °Keep your bowels regular to avoid problems.  OK to try a laxative to override constipation.  OK to use an antidairrheal to slow down diarrhea.  Call if not better after 2 tries ° °CALL IF YOU HAVE PROBLEMS/CONCERNS °Call if you are still struggling despite following these instructions. °Call if you have concerns not answered by these instructions ° °###################################################################### ° ° ° °1. Take your usually prescribed home medications unless otherwise directed. °2. DIET: Follow a light bland diet the first 24 hours after arrival home, such as soup, liquids, crackers, etc.  Be sure to include lots of fluids daily.  Avoid fast food or heavy meals as your are more likely to get nauseated.  Eat a low fat the next few days after surgery.   °3. PAIN CONTROL: °a. Pain is best controlled by a usual combination of three different methods TOGETHER: °i. Ice/Heat °ii. Over the counter pain medication °iii. Prescription pain medication °b. Most patients will experience some swelling and discomfort in the anus/rectal area. and incisions.  Ice packs or heat (30-60 minutes up to 6 times a day) will help. Use ice for the first few days to help decrease swelling and bruising, then switch to heat such as warm towels, sitz baths, warm baths, etc to help relax tight/sore spots and speed recovery.  Some people prefer to use ice alone, heat alone, alternating between ice & heat.  Experiment to what works for you.   Swelling and bruising can take several weeks to resolve.   °c. It is helpful to take an over-the-counter pain medication regularly for the first few weeks.  Choose one of the following that works best for you: °i. Naproxen (Aleve, etc)  Two 220mg tabs twice a day °ii. Ibuprofen (Advil, etc) Three 200mg tabs four times a day (every meal & bedtime) °iii. Acetaminophen (Tylenol, etc) 500-650mg four times a day (every meal & bedtime) °d. A  prescription for pain medication (such as oxycodone, hydrocodone, etc) should be given to you upon discharge.  Take your pain medication as prescribed.  °i. If you are having problems/concerns with the prescription medicine (does not control pain, nausea, vomiting, rash, itching, etc), please call us [phone] to see if we need to switch you to a different pain medicine that will work better for you and/or control your side effect better. °ii. If you need a refill on your pain medication, please contact your pharmacy.  They will contact our office to request authorization. Prescriptions will not be filled after 5 pm or on week-ends. ° °Use a Sitz Bath 4-8 times a day for relief ° ° °Sitz Bath °A sitz bath is a warm water bath taken in the sitting position that covers only the hips and buttocks. It may be used for either healing or hygiene purposes. Sitz baths are also used to relieve pain, itching, or muscle spasms. The water may contain medicine. Moist heat will help you heal and relax.  °HOME CARE INSTRUCTIONS  °Take 3 to 4 sitz baths a day. °1. Fill the bathtub   half full with warm water. °2. Sit in the water and open the drain a little. °3. Turn on the warm water to keep the tub half full. Keep the water running constantly. °4. Soak in the water for 15 to 20 minutes. °5. After the sitz bath, pat the affected area dry first. ° ° °4. KEEP YOUR BOWELS REGULAR °a. The goal is one bowel movement a day °b. Avoid getting constipated.  Between the surgery and the pain medications, it  is common to experience some constipation.  Increasing fluid intake and taking a fiber supplement (such as Metamucil, Citrucel, FiberCon, MiraLax, etc) 1-2 times a day regularly will usually help prevent this problem from occurring.  A mild laxative (prune juice, Milk of Magnesia, MiraLax, etc) should be taken according to package directions if there are no bowel movements after 48 hours. °c. Watch out for diarrhea.  If you have many loose bowel movements, simplify your diet to bland foods & liquids for a few days.  Stop any stool softeners and decrease your fiber supplement.  Switching to mild anti-diarrheal medications (Kayopectate, Pepto Bismol) can help.  If this worsens or does not improve, please call us. ° °5. Wound Care ° °a. Remove your bandages the day after surgery.  Unless discharge instructions indicate otherwise, leave your bandage dry and in place overnight.  Remove the bandage during your first bowel movement.   °b. Wear an absorbent pad or soft cotton gauze in your underwear as needed to catch any drainage and help keep the area  °c. Keep the area clean and dry.  Bathe / shower every day.  Keep the area clean by showering / bathing over the incision / wound.   It is okay to soak an open wound to help wash it.  Wet wipes or showers / gentle washing after bowel movements is often less traumatic than regular toilet paper. °d. You will often notice bleeding with bowel movements.  This should slow down by the end of the first week of surgery °e. Expect some drainage.  This should slow down, too, by the end of the first week of surgery.  Wear an absorbent pad or soft cotton gauze in your underwear until the drainage stops. ° °6. ACTIVITIES as tolerated:   °a. You may resume regular (light) daily activities beginning the next day--such as daily self-care, walking, climbing stairs--gradually increasing activities as tolerated.  If you can walk 30 minutes without difficulty, it is safe to try more intense  activity such as jogging, treadmill, bicycling, low-impact aerobics, swimming, etc. °b. Save the most intensive and strenuous activity for last such as sit-ups, heavy lifting, contact sports, etc  Refrain from any heavy lifting or straining until you are off narcotics for pain control.   °c. DO NOT PUSH THROUGH PAIN.  Let pain be your guide: If it hurts to do something, don't do it.  Pain is your body warning you to avoid that activity for another week until the pain goes down. °d. You may drive when you are no longer taking prescription pain medication, you can comfortably sit for long periods of time, and you can safely maneuver your car and apply brakes. °e. You may have sexual intercourse when it is comfortable.  °7. FOLLOW UP in our office °a. Please call [facility] at [phone] to set up an appointment to see your surgeon in the office for a follow-up appointment approximately 2 weeks after your surgery. °b. Make sure that you call for   this appointment the day you arrive home to insure a convenient appointment time. 10. IF YOU HAVE DISABILITY OR FAMILY LEAVE FORMS, BRING THEM TO THE OFFICE FOR PROCESSING.  DO NOT GIVE THEM TO YOUR DOCTOR.        WHEN TO CALL us [phone]: 1. Poor pain control 2. Reactions / problems with new medications (rash/itching, nausea, etc)  3. Fever over 101.5 F (38.5 C) 4. Inability to urinate 5. Nausea and/or vomiting 6. Worsening swelling or bruising 7. Continued bleeding from incision. 8. Increased pain, redness, or drainage from the incision  The clinic staff is available to answer your questions during regular business hours (8:30am-5pm).  Please dont hesitate to call and ask to speak to one of our nurses for clinical concerns.   A surgeon from [facility] is always on call at the hospitals   If you have a medical emergency, go to the nearest emergency room or call 911.    [facility], [place], [place],  [place], [place]  [address] ? MAIN: [phone] ? TOLL FREE: [phone] ? FAX [phone] [email or web address]   Anal Fistula Introduction An anal fistula is an abnormal tunnel that develops between the bowel and the skin near the outside of the anus, where stool (feces) comes out. The anus has many tiny glands that make lubricating fluid. Sometimes, these glands become plugged and infected, and that can cause a fluid-filled pocket (abscess) to form. An anal fistula often develops after this infection or abscess. What are the causes? In most cases, an anal fistula is caused by a past or current anal abscess. Other causes include:  A complication of surgery.  Trauma to the rectal area.  Radiation to the area.  Medical conditions or diseases, such as:  Chronic inflammatory bowel disease, such as Crohn disease or ulcerative colitis.  Colon cancer or rectal cancer.  Diverticular disease, such as diverticulitis.  An STD (sexually transmitted disease), such as gonorrhea, chlamydia, or syphilis.  An infection that is caused by HIV (human immunodeficiency virus).  Foreign body in the rectum. What are the signs or symptoms? Symptoms of this condition include:  Throbbing or constant pain that may be worse while you are sitting.  Swelling or irritation around the anus.  Drainage of pus or blood from an opening near the anus.  Pain with bowel movements.  Fever or chills. How is this diagnosed? Your health care provider will examine the area to find the openings of the anal fistula and the fistula tract. The external opening of the anal fistula may be seen during a physical exam. You may also have tests, including:  An exam of the rectal area with a gloved hand (digital rectal exam).  An exam with a probe or scope to help locate the internal opening of the fistula.  Imaging tests to find the exact location and path of the fistula. These tests may include X-rays, an  ultrasound, a CT scan, or MRI. The path is made visible by a dye that is injected into the fistula opening. You may have other tests to find the cause of the anal fistula. How is this treated? The most common treatment for an anal fistula is surgery. The type of surgery that is used will depend on where the fistula is located and how complex the fistula is. Surgical options include:  A fistulotomy. The whole fistula is opened up, and the contents are drained to promote healing.  Seton placement.  A silk string (seton) is placed into the fistula during a fistulotomy. This helps to drain any infection to promote healing.  Advancement flap procedure. Tissue is removed from your rectum or the skin around the anus and is attached to the opening of the fistula.  Bioprosthetic plug. A cone-shaped plug is made from your tissue and is used to block the opening of the fistula. Some anal fistulas do not require surgery. A nonsurgical treatment option involves injecting a fibrin glue to seal the fistula. You also may be prescribed an antibiotic medicine to treat an infection. Follow these instructions at home: Medicines  Take over-the-counter and prescription medicines only as told by your health care provider.  If you were prescribed an antibiotic medicine, take it as told by your health care provider. Do not stop taking the antibiotic even if you start to feel better.  Use a stool softener or a laxative if told to do so by your health care provider. General instructions  Eat a high-fiber diet as told by your health care provider. This can help to prevent constipation.  Drink enough fluid to keep your urine clear or pale yellow.  Take a warm sitz bath for 15-20 minutes, 3-4 times per day, or as told by your health care provider. Sitz baths can ease your pain and discomfort and help with healing.  Follow good hygiene to keep the anal area as clean and dry as possible. Use wet toilet paper or a moist  towelette after each bowel movement.  Keep all follow-up visits as told by your health care provider. This is important. Contact a health care provider if:  You have increased pain that is not controlled with medicines.  You have new redness or swelling around the anal area.  You have new fluid, blood, or pus coming from the anal area.  You have tenderness or warmth around the anal area. Get help right away if:  You have a fever.  You have severe pain.  You have chills or diarrhea.  You have severe problems urinating or having a bowel movement. This information is not intended to replace advice given to you by your health care provider. Make sure you discuss any questions you have with your health care provider. Document Released: 05/11/2008 Document Revised: 11/04/2015 Document Reviewed: 08/24/2014  2017 Elsevier    Post Anesthesia Home Care Instructions  Activity: Get plenty of rest for the remainder of the day. A responsible adult should stay with you for 24 hours following the procedure.  For the next 24 hours, DO NOT: -Drive a car -Paediatric nurse -Drink alcoholic beverages -Take any medication unless instructed by your physician -Make any legal decisions or sign important papers.  Meals: Start with liquid foods such as gelatin or soup. Progress to regular foods as tolerated. Avoid greasy, spicy, heavy foods. If nausea and/or vomiting occur, drink only clear liquids until the nausea and/or vomiting subsides. Call your physician if vomiting continues.  Special Instructions/Symptoms: Your throat may feel dry or sore from the anesthesia or the breathing tube placed in your throat during surgery. If this causes discomfort, gargle with warm salt water. The discomfort should disappear within 24 hours.  If you had a scopolamine patch placed behind your ear for the management of post- operative nausea and/or vomiting:  1. The medication in the patch is effective for 72  hours, after which it should be removed.  Wrap patch in a tissue and discard in the trash. Wash hands thoroughly with  soap and water. 2. You may remove the patch earlier than 72 hours if you experience unpleasant side effects which may include dry mouth, dizziness or visual disturbances. 3. Avoid touching the patch. Wash your hands with soap and water after contact with the patch.    Information for Discharge Teaching: EXPAREL (bupivacaine liposome injectable suspension)   Your surgeon gave you EXPAREL(bupivacaine) in your surgical incision to help control your pain after surgery.   EXPAREL is a local anesthetic that provides pain relief by numbing the tissue around the surgical site.  EXPAREL is designed to release pain medication over time and can control pain for up to 72 hours.  Depending on how you respond to EXPAREL, you may require less pain medication during your recovery.  Possible side effects:  Temporary loss of sensation or ability to move in the area where bupivacaine was injected.  Nausea, vomiting, constipation  Rarely, numbness and tingling in your mouth or lips, lightheadedness, or anxiety may occur.  Call your doctor right away if you think you may be experiencing any of these sensations, or if you have other questions regarding possible side effects.  Follow all other discharge instructions given to you by your surgeon or nurse. Eat a healthy diet and drink plenty of water or other fluids.  If you return to the hospital for any reason within 96 hours following the administration of EXPAREL, please inform your health care providers.

## 2016-07-27 NOTE — Anesthesia Postprocedure Evaluation (Addendum)
Anesthesia Post Note  Patient: Keith Webb  Procedure(s) Performed: Procedure(s) (LRB): RECTAL EXAM UNDER ANESTHESIA (N/A)  Patient location during evaluation: PACU Anesthesia Type: General Level of consciousness: sedated Pain management: pain level controlled Vital Signs Assessment: post-procedure vital signs reviewed and stable Respiratory status: spontaneous breathing and respiratory function stable Cardiovascular status: stable Anesthetic complications: no       Last Vitals:  Vitals:   07/27/16 0638 07/27/16 0830  BP: 119/71   Pulse: 74   Resp: 16 12  Temp: 37 C (!) 36.1 C    Last Pain:  Vitals:   07/27/16 0644  TempSrc:   PainSc: 5                  Jakeisha Stricker DANIEL

## 2016-07-27 NOTE — Transfer of Care (Signed)
Immediate Anesthesia Transfer of Care Note  Patient: Keith Webb  Procedure(s) Performed: Procedure(s) with comments: RECTAL EXAM UNDER ANESTHESIA (N/A) - Hemorrhoidal ligation, suture pexy  Patient Location: PACU  Anesthesia Type:General  Level of Consciousness: awake, alert , oriented and patient cooperative  Airway & Oxygen Therapy: Patient Spontanous Breathing and Patient connected to nasal cannula oxygen  Post-op Assessment: Report given to RN and Post -op Vital signs reviewed and stable  Post vital signs: Reviewed and stable  Last Vitals:  Vitals:   07/27/16 0638  BP: 119/71  Pulse: 74  Resp: 16  Temp: 37 C    Last Pain:  Vitals:   07/27/16 0644  TempSrc:   PainSc: 5       Patients Stated Pain Goal: 10 ([id number])  Complications: No apparent anesthesia complications

## 2016-07-27 NOTE — Op Note (Signed)
07/27/2016  8:29 AM  PATIENT:  Keith Webb  66 y.o. male  Patient Care Team: Lucianne Lei, MD as PCP - General (Family Medicine) Michael Boston, MD as Consulting Physician (General Surgery) Carol Ada, MD as Consulting Physician (Gastroenterology)  PRE-OPERATIVE DIAGNOSIS:  PERIRECTAL fistula  POST-OPERATIVE DIAGNOSIS:  Prolapsed Hemorrhoids  PROCEDURE:    RECTAL EXAM UNDER ANESTHESIA Internal hemorrhoidal ligation and pexy  SURGEON:  [name], MD  ANESTHESIA:   General Anorectal & Local field block  0.25% bupivacaine with epinephrine at the beginning of the case. Liposomal bupivacaine (Experel) at the end of the case.  EBL:  Total I/O In: 750 [I.V.:750] Out: 10 [Blood:10].  See operative record  Delay start of Pharmacological VTE agent (>24hrs) due to surgical blood loss or risk of bleeding:  NO  DRAINS: NONE  SPECIMEN:  No Specimen  DISPOSITION OF SPECIMEN:  N/A  COUNTS:  YES  PLAN OF CARE: Discharge home after PACU  PATIENT DISPOSITION:  PACU - hemodynamically stable.  INDICATION: Pleasant patient with Intermittent anal pain and drainage.  Concern for possible abscess or fistula.  Some swelling and tenderness noticed in office.  Suspicious for fistula.  Recommended operative examination under anesthesia and surgical treatment:  The anatomy & physiology of the anorectal region was discussed.  We discussed the pathophysiology of anorectal abscess and fistula.  Differential diagnosis was discussed.  Natural history progression was discussed.   I stressed the importance of a bowel regimen to have daily soft bowel movements to minimize progression of disease.     The patient's condition is not adequately controlled.  Non-operative treatment has not healed the fistula.  Therefore, I recommended examination under anaesthesia to confirm the diagnosis and treat the fistula.  I discussed techniques that may be required such as fistulotomy, ligation by LIFT  technique, and/or seton placement.  Benefits & alternatives discussed.  I noted a good likelihood this will help address the problem, but sometimes repeat operations and prolonged healing times may occur.  Risks such as bleeding, pain, recurrence, reoperation, injury to other organs, need for repair of tissues / organs reoperation, incontinence, heart attack, death, and other risks were discussed.      Educational handouts further explaining the pathology, treatment options, and bowel regimen were given.  The patient expressed understanding & wishes to proceed.  We will work to coordinate surgery for a mutually convenient time.    OR FINDINGS: Perianal skin clear with no evidence of external opening cystitis.  No cord felt.  No fissure.  No fistula.  No abscess.  Grade 2 & 3 internal hemorrhoids with some mild prolapse, especially left lateral pile.  Internal ligation done of hemorrhoidal columns 3.  DESCRIPTION:   Informed consent was confirmed. Patient underwent general anesthesia without difficulty. Patient was placed into prone positioning.  The perianal region was prepped and draped in sterile fashion. Surgical time-out confirmed our plan.  I did digital rectal examination and then transitioned over to anoscopy to get a sense of the anatomy.  Findings noted above.  I could not find external opening or sinus or fistula.  No abscess.  He will no dermal or subcutaneous cord.  No abnormalities rhonchi six.  No other abnormalities noted except for some internal hemorrhoids.  Mild inflammation.  Some prolapse.  I excised the hemorrhoids longitudinally at the Left lateral, posterior midline, right posteriolateral locations.   I used a 2-0 Vicryl suture on a UR-6 needle in a figure-of-eight fashion 6 cm proximal to  the anal verge.  I then ran that stitch longitudinally more distally to ligate the hemorrhoidal over a large Hill-Furgeson retarctor to avoid narrowing of the anal canal.  I then tied that  stitch down to cause a hemorrhoidopexy.  His right anterior hemorrhoidal pile seemed more grade ones I left alone.  Focus more the left lateral right posterior lateral piles.  On the posterior midline as well since there is some redundancy prominence and prolapse there.    I redid anoscopy examination.  At completion of this, all hemorrhoids had been removed or reduced into the rectum.  There is no prolapse.  External anatomy looked much more normal.  Hemostasis was good.  Patient is being extubated go to go to the recovery room.  I had discussed postop care in detail with the patient and his wife in the preop holding area.  Instructions for post-operative recovery and prescriptions are written.  Patient's wife felt comfortable following up later in the day and did not wish to stay for immediate postoperative discussion.   [name], M.D., F.A.C.S. Gastrointestinal and Minimally Invasive Surgery [facility] [address] [place],  [id number] [phone] Main / Paging

## 2016-07-27 NOTE — H&P (Signed)
Keith Webb 05/22/2016 8:44 AM Location: [facility] Patient #: [id number] DOB: May 06, 1951 Married / Language: English / Race: Black or African American Male  Patient Care Team: Lucianne Lei, MD as PCP - General (Family Medicine) Michael Boston, MD as Consulting Physician (General Surgery) Carol Ada, MD as Consulting Physician (Gastroenterology)   History of Present Illness  The patient is a 66 year old male who presents with anal fistula   Note for "Anal fistula": Patient sent for surgical consultation at the request of his gastroenterologist, Dr. Carol Ada up. Concern for recurrent abscesses and possible anal fistula.  Pleasant 66 year old male. Has had intermittent episodes of swelling and pain perianally. Usually they spontaneously drain. No interventions or surgeries. Had a colonoscopy 2011 which showed some large hemorrhoids but otherwise negative. Because of recurrent episodes of infection, fistula suspected. Recommendation for surgical consultation.  Patient moves his bowels about 2 or 3 times a day. He hasn't smoked in over a decade. He can walk about 3 miles without difficulty. No history of heart attacks or strokes or other cardiac issues. No personal nor family history of GI/colon cancer, inflammatory bowel disease, irritable bowel syndrome, allergy such as Celiac Sprue, dietary/dairy problems, colitis, ulcers nor gastritis. No recent sick contacts/gastroenteritis. No travel outside the country. No changes in diet. No dysphagia to solids or liquids. No significant heartburn or reflux. No hematochezia, hematemesis, coffee ground emesis. No evidence of prior gastric/peptic ulceration.   Allergies Nance Pear, [place]; 05/22/2016 8:45 AM) No Known Drug Allergies 05/22/2016  Medication History Nance Pear, [place]; 05/22/2016 8:46 AM) Oxycodone-Acetaminophen (5-325MG  Tablet, Oral as needed) Active. AmLODIPine Besylate (10MG  Tablet, Oral daily)  Active. Clotrimazole-Betamethasone (1-0.05% Cream, External daily) Active. Esomeprazole Magnesium (40MG  Capsule DR, Oral daily) Active. Ezetimibe-Simvastatin (10-40MG  Tablet, Oral daily) Active. Furosemide (40MG  Tablet, Oral daily) Active. Losartan Potassium (100MG  Tablet, Oral daily) Active. GuanFACINE HCl (1MG  Tablet, Oral daily) Active. Meloxicam (15MG  Tablet, Oral daily) Active. Meclizine HCl (25MG  Tablet, Oral daily) Active. Montelukast Sodium (10MG  Tablet, Oral daily) Active. Methocarbamol (500MG  Tablet, Oral daily) Active. Zorvolex (35MG  Capsule, Oral daily) Active. Iron (18MG  Tablet ER, Oral daily) Active. Multi-Minerals (Oral daily) Active. Medications Reconciled  Vitals (Keith Webb CMA; 05/22/2016 8:47 AM) 05/22/2016 8:46 AM Weight: 203.6 lb Height: 68in Body Surface Area: 2.06 m Body Mass Index: 30.96 kg/m  Temp.: 98.31F  Pulse: 75 (Regular)  BP: 132/92 (Sitting, Left Arm, Standard)       Physical Exam [name] MD; 05/22/2016 9:25 AM) General Mental Status-Alert. General Appearance-Not in acute distress, Not Sickly. Orientation-Oriented X3. Hydration-Well hydrated. Voice-Normal.  Integumentary Global Assessment Upon inspection and palpation of skin surfaces of the - Axillae: non-tender, no inflammation or ulceration, no drainage. and Distribution of scalp and body hair is normal. General Characteristics Temperature - normal warmth is noted.  Head and Neck Head-normocephalic, atraumatic with no lesions or palpable masses. Face Global Assessment - atraumatic, no absence of expression. Neck Global Assessment - no abnormal movements, no bruit auscultated on the right, no bruit auscultated on the left, no decreased range of motion, non-tender. Trachea-midline. Thyroid Gland Characteristics - non-tender.  Eye Eyeball - Left-Extraocular movements intact, No Nystagmus. Eyeball - Right-Extraocular  movements intact, No Nystagmus. Cornea - Left-No Hazy. Cornea - Right-No Hazy. Sclera/Conjunctiva - Left-No scleral icterus, No Discharge. Sclera/Conjunctiva - Right-No scleral icterus, No Discharge. Pupil - Left-Direct reaction to light normal. Pupil - Right-Direct reaction to light normal.  ENMT Ears Pinna - Left - no drainage observed, no generalized tenderness observed. Right -  no drainage observed, no generalized tenderness observed. Nose and Sinuses External Inspection of the Nose - no destructive lesion observed. Inspection of the nares - Left - quiet respiration. Right - quiet respiration. Mouth and Throat Lips - Upper Lip - no fissures observed, no pallor noted. Lower Lip - no fissures observed, no pallor noted. Nasopharynx - no discharge present. Oral Cavity/Oropharynx - Tongue - no dryness observed. Oral Mucosa - no cyanosis observed. Hypopharynx - no evidence of airway distress observed.  Chest and Lung Exam Inspection Movements - Normal and Symmetrical. Accessory muscles - No use of accessory muscles in breathing. Palpation Palpation of the chest reveals - Non-tender. Auscultation Breath sounds - Normal and Clear.  Cardiovascular Auscultation Rhythm - Regular. Murmurs & Other Heart Sounds - Auscultation of the heart reveals - No Murmurs and No Systolic Clicks.  Abdomen Inspection Inspection of the abdomen reveals - No Visible peristalsis and No Abnormal pulsations. Umbilicus - No Bleeding, No Urine drainage. Palpation/Percussion Palpation and Percussion of the abdomen reveal - Soft, Non Tender, No Rebound tenderness, No Rigidity (guarding) and No Cutaneous hyperesthesia. Note: Low midline incision without hernia. Abdomen soft. Nontender, nondistended. No guarding. No diastasis. No umbilical nor other hernias   Male Genitourinary Sexual Maturity Tanner 5 - Adult hair pattern and Adult penile size and shape. Note: No inguinal hernias. Normal external  genitalia. Epididymi, testes, and spermatic cords normal without any masses.   Rectal Note: Left lateral punctate sinus with inflammation. Can almost feel a cord going towards posterior midline. Highly suspicious for a fistula.    Perianal skin clean with good hygiene. No pruritis ani. No pilonidal disease. No fissure. Normal sphincter tone.  No external hemorrhoids. No condyloma warts.  Tolerates digital and anoscopic rectal exam. No rectal masses. Hemorrhoidal piles grade 2 mildly enlarged but no active bleeding or prolapse.   Peripheral Vascular Upper Extremity Inspection - Left - No Cyanotic nailbeds, Not Ischemic. Right - No Cyanotic nailbeds, Not Ischemic.  Neurologic Neurologic evaluation reveals -normal attention span and ability to concentrate, able to name objects and repeat phrases. Appropriate fund of knowledge , normal sensation and normal coordination. Mental Status Affect - not angry, not paranoid. Cranial Nerves-Normal Bilaterally. Gait-Normal.  Neuropsychiatric Mental status exam performed with findings of-able to articulate well with normal speech/language, rate, volume and coherence, thought content normal with ability to perform basic computations and apply abstract reasoning and no evidence of hallucinations, delusions, obsessions or homicidal/suicidal ideation.  Musculoskeletal Global Assessment Spine, Ribs and Pelvis - no instability, subluxation or laxity. Right Upper Extremity - no instability, subluxation or laxity.  Lymphatic Head & Neck  General Head & Neck Lymphatics: Bilateral - Description - No Localized lymphadenopathy. Axillary  General Axillary Region: Bilateral - Description - No Localized lymphadenopathy. Femoral & Inguinal  Generalized Femoral & Inguinal Lymphatics: Left - Description - No Localized lymphadenopathy. Right - Description - No Localized lymphadenopathy.   Results [name] MD; 05/22/2016 2:11  PM) Procedures  Name Value Date Hemorrhoids Procedure Internal exam: Internal Hemorroids ( non-bleeding) Other: Left lateral punctate sinus with inflammation. Can almost feel a cord going towards posterior midline. Highly suspicious for a fistula. Perianal skin clean with good hygiene. No pruritis ani. No pilonidal disease. No fissure. Normal sphincter tone.  No external hemorrhoids. No condyloma warts. Tolerates digital and anoscopic rectal exam. No rectal masses. Hemorrhoidal piles grade 2 mildly enlarged but no active bleeding or prolapse.  Performed: 05/22/2016 9:25 AM    Assessment & Plan  ANAL FISTULA (  K60.3) Impression: History of intermittent perirectal abscesses now with chronic sinus very suspicious for perirectal fistula.  I do not think this will resolve without surgery. Recommended examination under anesthesia with either superficial fistulotomy versus layered LIF T repair. Outpatient surgery. While he was not excited to hear he needs surgery, he agrees with the recommendations. We'll work to set up convenient time.  Patient delayed intervention but is now ready to consider surgery.    Current Plans ANOSCOPY, DIAGNOSTIC [id number]) Pt Education - CCS Abscess/Fistula (AT): discussed with patient and provided information. The anatomy & physiology of the anorectal region was discussed. We discussed the pathophysiology of anorectal abscess and fistula. Differential diagnosis was discussed. Natural history progression was discussed. I stressed the importance of a bowel regimen to have daily soft bowel movements to minimize progression of disease.  The patient's condition is not adequately controlled. Non-operative treatment has not healed the fistula. Therefore, I recommended examination under anaesthesia to confirm the diagnosis and treat the fistula. I discussed techniques that may be required such as fistulotomy, ligation by LIFT technique, and/or seton  placement. Benefits & alternatives discussed. I noted a good likelihood this will help address the problem, but sometimes repeat operations and prolonged healing times may occur. Risks such as bleeding, pain, recurrence, reoperation, incontinence, heart attack, death, and other risks were discussed.  Educational handouts further explaining the pathology, treatment options, and bowel regimen were given. The patient expressed understanding & wishes to proceed. We will work to coordinate surgery for a mutually convenient time.  ENCOUNTER FOR PREOPERATIVE EXAMINATION FOR GENERAL SURGICAL PROCEDURE (Z01.818) Current Plans You are being scheduled for surgery- Our schedulers will call you.  You should hear from our office's scheduling department within 5 working days about the location, date, and time of surgery. We try to make accommodations for patient's preferences in scheduling surgery, but sometimes the OR schedule or the surgeon's schedule prevents [place] from making those accommodations.  If you have not heard from our office [phone]) in 5 working days, call the office and ask for your surgeon's nurse.  If you have other questions about your diagnosis, plan, or surgery, call the office and ask for your surgeon's nurse.  Pt Education - CCS Rectal Prep for Anorectal outpatient/office surgery: discussed with patient and provided information. Pt Education - CCS Rectal Surgery HCI (Marijke Guadiana): discussed with patient and provided information. Pt Education - CCS Hemorrhoids (Maicie Vanderloop): discussed with patient and provided information.  [name], M.D., F.A.C.S. Gastrointestinal and Minimally Invasive Surgery [facility] [address] [place], [place] [address] [phone] Main / Paging

## 2016-07-27 NOTE — Interval H&P Note (Signed)
History and Physical Interval Note:  07/27/2016 7:37 AM  Keith Webb  has presented today for surgery, with the diagnosis of PERIRECTAL fistula  The various methods of treatment have been discussed with the patient and family. After consideration of risks, benefits and other options for treatment, the patient has consented to  Procedure(s): EXAM UNDER ANESTHESIA WITH REPAIR OF PERIRECTAL FISTULA (N/A) POSSIBLE HEMORRHOIDECTOMY (N/A) as a surgical intervention .  The patient's history has been reviewed, patient examined, no change in status, stable for surgery.  I have reviewed the patient's chart and labs.  Questions were answered to the patient's satisfaction.     Chayne Baumgart C.

## 2016-07-28 ENCOUNTER — Encounter (HOSPITAL_BASED_OUTPATIENT_CLINIC_OR_DEPARTMENT_OTHER): Payer: Self-pay | Admitting: Surgery

## 2016-08-08 DIAGNOSIS — C61 Malignant neoplasm of prostate: Secondary | ICD-10-CM | POA: Diagnosis not present

## 2016-08-14 DIAGNOSIS — N5201 Erectile dysfunction due to arterial insufficiency: Secondary | ICD-10-CM | POA: Diagnosis not present

## 2016-08-14 DIAGNOSIS — C61 Malignant neoplasm of prostate: Secondary | ICD-10-CM | POA: Diagnosis not present

## 2016-09-19 DIAGNOSIS — Z96642 Presence of left artificial hip joint: Secondary | ICD-10-CM | POA: Diagnosis not present

## 2016-09-19 DIAGNOSIS — S83281A Other tear of lateral meniscus, current injury, right knee, initial encounter: Secondary | ICD-10-CM | POA: Diagnosis not present

## 2016-09-19 DIAGNOSIS — S83282A Other tear of lateral meniscus, current injury, left knee, initial encounter: Secondary | ICD-10-CM | POA: Diagnosis not present

## 2016-09-25 DIAGNOSIS — M545 Low back pain: Secondary | ICD-10-CM | POA: Diagnosis not present

## 2016-09-25 DIAGNOSIS — M25562 Pain in left knee: Secondary | ICD-10-CM | POA: Diagnosis not present

## 2016-09-27 ENCOUNTER — Other Ambulatory Visit: Payer: Self-pay | Admitting: Neurological Surgery

## 2016-09-27 DIAGNOSIS — M545 Low back pain: Secondary | ICD-10-CM

## 2016-10-04 ENCOUNTER — Ambulatory Visit
Admission: RE | Admit: 2016-10-04 | Discharge: 2016-10-04 | Disposition: A | Discharge status: Home / Self Care | Payer: BLUE CROSS/BLUE SHIELD | Source: Ambulatory Visit | Attending: Neurological Surgery | Admitting: Neurological Surgery

## 2016-10-04 DIAGNOSIS — M545 Low back pain: Secondary | ICD-10-CM | POA: Diagnosis not present

## 2016-10-12 DIAGNOSIS — I351 Nonrheumatic aortic (valve) insufficiency: Secondary | ICD-10-CM | POA: Diagnosis not present

## 2016-10-12 DIAGNOSIS — M48062 Spinal stenosis, lumbar region with neurogenic claudication: Secondary | ICD-10-CM | POA: Diagnosis not present

## 2016-10-17 DIAGNOSIS — E785 Hyperlipidemia, unspecified: Secondary | ICD-10-CM | POA: Diagnosis not present

## 2016-10-17 DIAGNOSIS — I351 Nonrheumatic aortic (valve) insufficiency: Secondary | ICD-10-CM | POA: Diagnosis not present

## 2016-10-17 DIAGNOSIS — I10 Essential (primary) hypertension: Secondary | ICD-10-CM | POA: Diagnosis not present

## 2016-10-19 DIAGNOSIS — I10 Essential (primary) hypertension: Secondary | ICD-10-CM | POA: Diagnosis not present

## 2016-10-19 DIAGNOSIS — M150 Primary generalized (osteo)arthritis: Secondary | ICD-10-CM | POA: Diagnosis not present

## 2016-10-19 DIAGNOSIS — E782 Mixed hyperlipidemia: Secondary | ICD-10-CM | POA: Diagnosis not present

## 2016-10-19 DIAGNOSIS — R7309 Other abnormal glucose: Secondary | ICD-10-CM | POA: Diagnosis not present

## 2016-11-11 NOTE — Addendum Note (Signed)
Addendum  created 11/11/16 0940 by Duane Boston, MD   Sign clinical note

## 2017-02-13 DIAGNOSIS — C61 Malignant neoplasm of prostate: Secondary | ICD-10-CM | POA: Diagnosis not present

## 2017-02-16 DIAGNOSIS — R7309 Other abnormal glucose: Secondary | ICD-10-CM | POA: Diagnosis not present

## 2017-02-16 DIAGNOSIS — N189 Chronic kidney disease, unspecified: Secondary | ICD-10-CM | POA: Diagnosis not present

## 2017-02-16 DIAGNOSIS — I10 Essential (primary) hypertension: Secondary | ICD-10-CM | POA: Diagnosis not present

## 2017-02-16 DIAGNOSIS — E782 Mixed hyperlipidemia: Secondary | ICD-10-CM | POA: Diagnosis not present

## 2017-02-19 DIAGNOSIS — C61 Malignant neoplasm of prostate: Secondary | ICD-10-CM | POA: Diagnosis not present

## 2017-02-19 DIAGNOSIS — N5201 Erectile dysfunction due to arterial insufficiency: Secondary | ICD-10-CM | POA: Diagnosis not present

## 2017-05-29 DIAGNOSIS — M48062 Spinal stenosis, lumbar region with neurogenic claudication: Secondary | ICD-10-CM | POA: Diagnosis not present

## 2017-05-30 DIAGNOSIS — K219 Gastro-esophageal reflux disease without esophagitis: Secondary | ICD-10-CM | POA: Diagnosis not present

## 2017-05-31 ENCOUNTER — Other Ambulatory Visit: Payer: Self-pay | Admitting: Neurological Surgery

## 2017-05-31 DIAGNOSIS — M48062 Spinal stenosis, lumbar region with neurogenic claudication: Secondary | ICD-10-CM

## 2017-06-04 DIAGNOSIS — Z01812 Encounter for preprocedural laboratory examination: Secondary | ICD-10-CM | POA: Diagnosis not present

## 2017-06-04 DIAGNOSIS — M48062 Spinal stenosis, lumbar region with neurogenic claudication: Secondary | ICD-10-CM | POA: Diagnosis not present

## 2017-06-14 DIAGNOSIS — M48061 Spinal stenosis, lumbar region without neurogenic claudication: Secondary | ICD-10-CM | POA: Diagnosis not present

## 2017-06-19 ENCOUNTER — Ambulatory Visit
Admission: RE | Admit: 2017-06-19 | Discharge: 2017-06-19 | Disposition: A | Discharge status: Home / Self Care | Payer: BLUE CROSS/BLUE SHIELD | Source: Ambulatory Visit | Attending: Neurological Surgery | Admitting: Neurological Surgery

## 2017-06-19 VITALS — BP 137/94 | HR 69

## 2017-06-19 DIAGNOSIS — Z981 Arthrodesis status: Secondary | ICD-10-CM

## 2017-06-19 DIAGNOSIS — M48062 Spinal stenosis, lumbar region with neurogenic claudication: Secondary | ICD-10-CM

## 2017-06-19 DIAGNOSIS — M5126 Other intervertebral disc displacement, lumbar region: Secondary | ICD-10-CM | POA: Diagnosis not present

## 2017-06-19 MED ORDER — OXYCODONE-ACETAMINOPHEN 5-325 MG PO TABS
1.0000 | ORAL_TABLET | Freq: Once | ORAL | Status: AC
  Administered 2017-06-19: 1 via ORAL

## 2017-06-19 MED ORDER — IOPAMIDOL (ISOVUE-M 200) INJECTION 41%
15.0000 mL | Freq: Once | INTRAMUSCULAR | Status: AC
  Administered 2017-06-19: 15 mL via INTRATHECAL

## 2017-06-19 MED ORDER — DIAZEPAM 5 MG PO TABS
5.0000 mg | ORAL_TABLET | Freq: Once | ORAL | Status: AC
  Administered 2017-06-19: 5 mg via ORAL

## 2017-06-19 NOTE — Discharge Instructions (Signed)

## 2017-06-25 ENCOUNTER — Other Ambulatory Visit: Payer: Self-pay | Admitting: Neurological Surgery

## 2017-06-25 DIAGNOSIS — I10 Essential (primary) hypertension: Secondary | ICD-10-CM | POA: Diagnosis not present

## 2017-06-25 DIAGNOSIS — M48062 Spinal stenosis, lumbar region with neurogenic claudication: Secondary | ICD-10-CM | POA: Diagnosis not present

## 2017-06-25 DIAGNOSIS — Z6829 Body mass index (BMI) 29.0-29.9, adult: Secondary | ICD-10-CM | POA: Diagnosis not present

## 2017-07-09 NOTE — Pre-Procedure Instructions (Signed)
Keith Webb  07/09/2017      [facility] [place], [place] [address] [place] [address] Phone: [phone] Fax: [phone]    Your procedure is scheduled on July 13, 2017.  Report to [facility] Admitting at 1:05 PM.  Call this number if you have problems the morning of surgery:  [phone]   Remember:  Do not eat food or drink liquids after midnight.  Take these medicines the morning of surgery with A SIP OF WATER amlodipine (norvasc), cyclobenzaprine (flexeril), dexlansoprazole (dexilant),   7 days prior to surgery STOP taking any diclofenac (zorvolex), Aspirin (unless otherwise instructed by your surgeon), Aleve, Naproxen, Ibuprofen, Motrin, Advil, Goody's, BC's, all herbal medications, fish oil, and all vitamins  Continue all other medications as instructed by your physician except follow the above medication instructions before surgery   Do not wear jewelry.  Do not wear lotions, powders, or colognes, or deodorant.  Men may shave face and neck.  Do not bring valuables to the hospital.  [facility] is not responsible for any belongings or valuables.  Contacts, dentures or bridgework may not be worn into surgery.  Leave your suitcase in the car.  After surgery it may be brought to your room.  For patients admitted to the hospital, discharge time will be determined by your treatment team.  Patients discharged the day of surgery will not be allowed to drive home.   Special instructions:   [place]- Preparing For Surgery  Before surgery, you can play an important role. Because skin is not sterile, your skin needs to be as free of germs as possible. You can reduce the number of germs on your skin by washing with CHG (chlorahexidine gluconate) Soap before surgery.  CHG is an antiseptic cleaner which kills germs and bonds with the skin to continue killing germs even after  washing.  Please do not use if you have an allergy to CHG or antibacterial soaps. If your skin becomes reddened/irritated stop using the CHG.  Do not shave (including legs and underarms) for at least 48 hours prior to first CHG shower. It is OK to shave your face.  Please follow these instructions carefully.   1. Shower the NIGHT BEFORE SURGERY and the MORNING OF SURGERY with CHG.   2. If you chose to wash your hair, wash your hair first as usual with your normal shampoo.  3. After you shampoo, rinse your hair and body thoroughly to remove the shampoo.  4. Use CHG as you would any other liquid soap. You can apply CHG directly to the skin and wash gently with a scrungie or a clean washcloth.   5. Apply the CHG Soap to your body ONLY FROM THE NECK DOWN.  Do not use on open wounds or open sores. Avoid contact with your eyes, ears, mouth and genitals (private parts). Wash Face and genitals (private parts)  with your normal soap.  6. Wash thoroughly, paying special attention to the area where your surgery will be performed.  7. Thoroughly rinse your body with warm water from the neck down.  8. DO NOT shower/wash with your normal soap after using and rinsing off the CHG Soap.  9. Pat yourself dry with a CLEAN TOWEL.  10. Wear CLEAN PAJAMAS to bed the night before surgery, wear comfortable clothes the morning of surgery  11. Place CLEAN SHEETS on  your bed the night of your first shower and DO NOT SLEEP WITH PETS.   Day of Surgery: Do not apply any deodorants/lotions. Please wear clean clothes to the hospital/surgery center.    Please read over the following fact sheets that you were given. Pain Booklet, Coughing and Deep Breathing and Surgical Site Infection Prevention

## 2017-07-10 ENCOUNTER — Other Ambulatory Visit: Payer: Self-pay

## 2017-07-10 ENCOUNTER — Encounter (HOSPITAL_COMMUNITY): Payer: Self-pay

## 2017-07-10 ENCOUNTER — Encounter (HOSPITAL_COMMUNITY)
Admission: RE | Admit: 2017-07-10 | Discharge: 2017-07-10 | Disposition: A | Discharge status: Home / Self Care | Payer: BLUE CROSS/BLUE SHIELD | Source: Ambulatory Visit | Attending: Neurological Surgery | Admitting: Neurological Surgery

## 2017-07-10 DIAGNOSIS — M5136 Other intervertebral disc degeneration, lumbar region: Secondary | ICD-10-CM | POA: Diagnosis not present

## 2017-07-10 DIAGNOSIS — M419 Scoliosis, unspecified: Secondary | ICD-10-CM | POA: Diagnosis not present

## 2017-07-10 DIAGNOSIS — G4733 Obstructive sleep apnea (adult) (pediatric): Secondary | ICD-10-CM | POA: Diagnosis not present

## 2017-07-10 DIAGNOSIS — M48061 Spinal stenosis, lumbar region without neurogenic claudication: Secondary | ICD-10-CM | POA: Diagnosis not present

## 2017-07-10 DIAGNOSIS — M1990 Unspecified osteoarthritis, unspecified site: Secondary | ICD-10-CM | POA: Diagnosis not present

## 2017-07-10 DIAGNOSIS — Z87891 Personal history of nicotine dependence: Secondary | ICD-10-CM | POA: Diagnosis not present

## 2017-07-10 DIAGNOSIS — M4326 Fusion of spine, lumbar region: Secondary | ICD-10-CM | POA: Diagnosis not present

## 2017-07-10 DIAGNOSIS — Z7982 Long term (current) use of aspirin: Secondary | ICD-10-CM | POA: Diagnosis not present

## 2017-07-10 DIAGNOSIS — M532X6 Spinal instabilities, lumbar region: Secondary | ICD-10-CM | POA: Diagnosis not present

## 2017-07-10 DIAGNOSIS — M48062 Spinal stenosis, lumbar region with neurogenic claudication: Secondary | ICD-10-CM | POA: Diagnosis not present

## 2017-07-10 DIAGNOSIS — Z79899 Other long term (current) drug therapy: Secondary | ICD-10-CM | POA: Diagnosis not present

## 2017-07-10 DIAGNOSIS — I10 Essential (primary) hypertension: Secondary | ICD-10-CM | POA: Diagnosis not present

## 2017-07-10 DIAGNOSIS — Z923 Personal history of irradiation: Secondary | ICD-10-CM | POA: Diagnosis not present

## 2017-07-10 DIAGNOSIS — Z8546 Personal history of malignant neoplasm of prostate: Secondary | ICD-10-CM | POA: Diagnosis not present

## 2017-07-10 DIAGNOSIS — K219 Gastro-esophageal reflux disease without esophagitis: Secondary | ICD-10-CM | POA: Diagnosis not present

## 2017-07-10 DIAGNOSIS — Z96652 Presence of left artificial knee joint: Secondary | ICD-10-CM | POA: Diagnosis not present

## 2017-07-10 DIAGNOSIS — M549 Dorsalgia, unspecified: Secondary | ICD-10-CM | POA: Diagnosis not present

## 2017-07-10 DIAGNOSIS — Z91048 Other nonmedicinal substance allergy status: Secondary | ICD-10-CM | POA: Diagnosis not present

## 2017-07-10 DIAGNOSIS — E7800 Pure hypercholesterolemia, unspecified: Secondary | ICD-10-CM | POA: Diagnosis not present

## 2017-07-10 LAB — BASIC METABOLIC PANEL
ANION GAP: 8 (ref 5–15)
BUN: 24 mg/dL — ABNORMAL HIGH (ref 6–20)
CALCIUM: 10.2 mg/dL (ref 8.9–10.3)
CHLORIDE: 107 mmol/L (ref 101–111)
CO2: 24 mmol/L (ref 22–32)
Creatinine, Ser: 1.29 mg/dL — ABNORMAL HIGH (ref 0.61–1.24)
GFR calc non Af Amer: 55 mL/min — ABNORMAL LOW (ref >60)
GLUCOSE: 77 mg/dL (ref 65–99)
Potassium: 4.6 mmol/L (ref 3.5–5.1)
Sodium: 139 mmol/L (ref 135–145)

## 2017-07-10 LAB — CBC
HCT: 39.7 % (ref 39.0–52.0)
HEMOGLOBIN: 13.2 g/dL (ref 13.0–17.0)
MCH: 30.8 pg (ref 26.0–34.0)
MCHC: 33.2 g/dL (ref 30.0–36.0)
MCV: 92.5 fL (ref 78.0–100.0)
Platelets: 277 10*3/uL (ref 150–400)
RBC: 4.29 MIL/uL (ref 4.22–5.81)
RDW: 14.8 % (ref 11.5–15.5)
WBC: 6.3 10*3/uL (ref 4.0–10.5)

## 2017-07-10 LAB — TYPE AND SCREEN
ABO/RH(D): O POS
Antibody Screen: NEGATIVE

## 2017-07-10 LAB — SURGICAL PCR SCREEN
MRSA, PCR: NEGATIVE
Staphylococcus aureus: NEGATIVE

## 2017-07-10 NOTE — Progress Notes (Signed)
PCP - Dr. Lucianne Lei Cardiologist - Dr. Woody Seller, [facility], patient states he has not seen him recently, but believes he is due for a visit soon  Chest x-ray - n/a EKG - patient unsure of last one; obtaining one today 07/10/17, requesting comparison tracing from Dr. Woody Seller Stress Test - patient unsure when but feels he has had one, requesting from Dr. Woody Seller ECHO - Epic shows 2006 but patient feels he has had one since, requesting from Dr. Woody Seller Cardiac Cath - patient denies  Sleep Study - patient states it was 10+ years ago; no longer wears CPAP  Anesthesia review: yes, abnormal EKG  Patient denies shortness of breath, fever, cough and chest pain at PAT appointment   Patient verbalized understanding of instructions that were given to them at the PAT appointment. Patient was also instructed that they will need to review over the PAT instructions again at home before surgery.

## 2017-07-11 NOTE — Progress Notes (Signed)
Anesthesia Chart Review:  Pt is a 67 year old male scheduled for L2-3 laminectomy, L1-2, L2-3 posterior lateral fusion, L1-5 extension of pedicle screw fixation on 07/13/2017 with Sherley Bounds, MD  - PCP is Lucianne Lei, MD - Cardiologist is Vear Clock, MD at [facility]. Last office visit 10/17/16; 1 year f/u recommended  PMH includes: HTN, hyperlipidemia, heart murmur (unspecified), OSA, prostate cancer, GERD.  Former smoker.  BMI 29.5.  S/p lumbar laminectomy and fusion 09/10/15.   Medications include: Amlodipine, ASA 325 mg, Dexilant, Vytorin, Lasix, guanfacine, iron, losartan, sildenafil  BP 129/84   Pulse 72   Temp 36.8 C   Resp 20   Ht 5\' 8"  (1.727 m)   Wt 194 lb 9.6 oz (88.3 kg)   SpO2 95%   BMI 29.59 kg/m   Preoperative labs reviewed.    CXR will be obtained day of surgery  EKG 07/10/17: NSR. Nonspecific T wave abnormality  Echo 10/12/16 [facility] cardiovascular): 1.  LV cavity normal in size.  Normal global wall motion.  Grade I diastolic dysfunction, normal LAP.  Calculated EF 67%. 2.  LA cavity normal in size.  Intra-atrial septum is thin and mobile but appears to be intact. 3.  Trileaflet aortic valve with mild to moderate regurgitation. 4.  Mild mitral regurgitation. 5.  Trace tricuspid regurgitation.  Unable to estimate PA pressure due to absent/minimal TR signal. 6.  Compared to study 03/23/15, no significant change.  Exercise stress test 04/13/14 [facility] cardiovascular): 1.  Negative for ischemia. 2.  Marked decrease in exercise tolerance.  If no changes, I anticipate pt can proceed with surgery as scheduled. ]  Willeen Cass, FNP-BC [facility]/Anesthesiology Phone: [phone] 07/11/2017 2:34 PM

## 2017-07-13 ENCOUNTER — Inpatient Hospital Stay (HOSPITAL_COMMUNITY)
Admission: RE | Admit: 2017-07-13 | Discharge: 2017-07-14 | DRG: 460 | Disposition: A | Discharge status: Home / Self Care | Payer: BLUE CROSS/BLUE SHIELD | Source: Ambulatory Visit | Attending: Neurological Surgery | Admitting: Neurological Surgery

## 2017-07-13 ENCOUNTER — Inpatient Hospital Stay (HOSPITAL_COMMUNITY): Payer: BLUE CROSS/BLUE SHIELD | Admitting: Emergency Medicine

## 2017-07-13 ENCOUNTER — Other Ambulatory Visit: Payer: Self-pay

## 2017-07-13 ENCOUNTER — Inpatient Hospital Stay (HOSPITAL_COMMUNITY): Payer: BLUE CROSS/BLUE SHIELD | Admitting: Anesthesiology

## 2017-07-13 ENCOUNTER — Encounter (HOSPITAL_COMMUNITY): Payer: Self-pay

## 2017-07-13 ENCOUNTER — Encounter (HOSPITAL_COMMUNITY)
Admission: RE | Disposition: A | Discharge status: Home / Self Care | Payer: Self-pay | Source: Ambulatory Visit | Attending: Neurological Surgery

## 2017-07-13 ENCOUNTER — Inpatient Hospital Stay (HOSPITAL_COMMUNITY): Payer: BLUE CROSS/BLUE SHIELD

## 2017-07-13 DIAGNOSIS — M48061 Spinal stenosis, lumbar region without neurogenic claudication: Secondary | ICD-10-CM | POA: Diagnosis not present

## 2017-07-13 DIAGNOSIS — Z91048 Other nonmedicinal substance allergy status: Secondary | ICD-10-CM

## 2017-07-13 DIAGNOSIS — Z7982 Long term (current) use of aspirin: Secondary | ICD-10-CM | POA: Diagnosis not present

## 2017-07-13 DIAGNOSIS — Z419 Encounter for procedure for purposes other than remedying health state, unspecified: Secondary | ICD-10-CM

## 2017-07-13 DIAGNOSIS — M532X6 Spinal instabilities, lumbar region: Secondary | ICD-10-CM | POA: Diagnosis not present

## 2017-07-13 DIAGNOSIS — Z87891 Personal history of nicotine dependence: Secondary | ICD-10-CM

## 2017-07-13 DIAGNOSIS — E7800 Pure hypercholesterolemia, unspecified: Secondary | ICD-10-CM | POA: Diagnosis present

## 2017-07-13 DIAGNOSIS — M5136 Other intervertebral disc degeneration, lumbar region: Secondary | ICD-10-CM | POA: Diagnosis not present

## 2017-07-13 DIAGNOSIS — Z79899 Other long term (current) drug therapy: Secondary | ICD-10-CM | POA: Diagnosis not present

## 2017-07-13 DIAGNOSIS — M48062 Spinal stenosis, lumbar region with neurogenic claudication: Secondary | ICD-10-CM | POA: Diagnosis not present

## 2017-07-13 DIAGNOSIS — K219 Gastro-esophageal reflux disease without esophagitis: Secondary | ICD-10-CM | POA: Diagnosis present

## 2017-07-13 DIAGNOSIS — M1990 Unspecified osteoarthritis, unspecified site: Secondary | ICD-10-CM | POA: Diagnosis present

## 2017-07-13 DIAGNOSIS — Z96652 Presence of left artificial knee joint: Secondary | ICD-10-CM | POA: Diagnosis present

## 2017-07-13 DIAGNOSIS — Z981 Arthrodesis status: Secondary | ICD-10-CM

## 2017-07-13 DIAGNOSIS — Z8546 Personal history of malignant neoplasm of prostate: Secondary | ICD-10-CM | POA: Diagnosis not present

## 2017-07-13 DIAGNOSIS — G4733 Obstructive sleep apnea (adult) (pediatric): Secondary | ICD-10-CM | POA: Diagnosis present

## 2017-07-13 DIAGNOSIS — M4326 Fusion of spine, lumbar region: Secondary | ICD-10-CM | POA: Diagnosis not present

## 2017-07-13 DIAGNOSIS — Z923 Personal history of irradiation: Secondary | ICD-10-CM

## 2017-07-13 DIAGNOSIS — I10 Essential (primary) hypertension: Secondary | ICD-10-CM | POA: Diagnosis present

## 2017-07-13 DIAGNOSIS — M419 Scoliosis, unspecified: Secondary | ICD-10-CM | POA: Diagnosis present

## 2017-07-13 DIAGNOSIS — M549 Dorsalgia, unspecified: Secondary | ICD-10-CM | POA: Diagnosis present

## 2017-07-13 HISTORY — PX: LAMINECTOMY WITH POSTERIOR LATERAL ARTHRODESIS LEVEL 2: SHX6336

## 2017-07-13 SURGERY — LAMINECTOMY WITH POSTERIOR LATERAL ARTHRODESIS LEVEL 2
Anesthesia: General | Site: Back

## 2017-07-13 MED ORDER — FENTANYL CITRATE (PF) 250 MCG/5ML IJ SOLN
INTRAMUSCULAR | Status: AC
  Filled 2017-07-13: qty 5

## 2017-07-13 MED ORDER — EPHEDRINE 5 MG/ML INJ
INTRAVENOUS | Status: AC
  Filled 2017-07-13: qty 10

## 2017-07-13 MED ORDER — ONDANSETRON HCL 4 MG/2ML IJ SOLN
INTRAMUSCULAR | Status: DC | PRN
  Administered 2017-07-13: 4 mg via INTRAVENOUS

## 2017-07-13 MED ORDER — ROCURONIUM BROMIDE 10 MG/ML (PF) SYRINGE
PREFILLED_SYRINGE | INTRAVENOUS | Status: AC
  Filled 2017-07-13: qty 5

## 2017-07-13 MED ORDER — ONDANSETRON HCL 4 MG/2ML IJ SOLN: 4.0000 mg | Freq: Four times a day (QID) | INTRAMUSCULAR | Status: DC | PRN

## 2017-07-13 MED ORDER — EPHEDRINE SULFATE-NACL 50-0.9 MG/10ML-% IV SOSY
PREFILLED_SYRINGE | INTRAVENOUS | Status: DC | PRN
  Administered 2017-07-13: 5 mg via INTRAVENOUS

## 2017-07-13 MED ORDER — SUGAMMADEX SODIUM 200 MG/2ML IV SOLN
INTRAVENOUS | Status: DC | PRN
  Administered 2017-07-13: 200 mg via INTRAVENOUS

## 2017-07-13 MED ORDER — SODIUM CHLORIDE 0.9 % IV SOLN: 250.0000 mL | INTRAVENOUS | Status: DC

## 2017-07-13 MED ORDER — ONDANSETRON HCL 4 MG/2ML IJ SOLN
INTRAMUSCULAR | Status: AC
  Filled 2017-07-13: qty 2

## 2017-07-13 MED ORDER — LACTATED RINGERS IV SOLN
INTRAVENOUS | Status: DC | PRN
  Administered 2017-07-13 (×2): via INTRAVENOUS

## 2017-07-13 MED ORDER — ONDANSETRON HCL 4 MG PO TABS: 4.0000 mg | ORAL_TABLET | Freq: Four times a day (QID) | ORAL | Status: DC | PRN

## 2017-07-13 MED ORDER — DEXAMETHASONE SODIUM PHOSPHATE 4 MG/ML IJ SOLN
INTRAMUSCULAR | Status: DC | PRN
  Administered 2017-07-13: 10 mg via INTRAVENOUS

## 2017-07-13 MED ORDER — OXYCODONE HCL 5 MG PO TABS
5.0000 mg | ORAL_TABLET | ORAL | Status: DC | PRN
  Administered 2017-07-13 – 2017-07-14 (×4): 5 mg via ORAL
  Filled 2017-07-13 (×3): qty 1

## 2017-07-13 MED ORDER — HEMOSTATIC AGENTS (NO CHARGE) OPTIME
TOPICAL | Status: DC | PRN
  Administered 2017-07-13: 1 via TOPICAL

## 2017-07-13 MED ORDER — MONTELUKAST SODIUM 10 MG PO TABS
10.0000 mg | ORAL_TABLET | Freq: Every day | ORAL | Status: DC
  Administered 2017-07-13: 10 mg via ORAL
  Filled 2017-07-13: qty 1

## 2017-07-13 MED ORDER — THROMBIN 5000 UNITS EX SOLR
CUTANEOUS | Status: AC
  Filled 2017-07-13: qty 5000

## 2017-07-13 MED ORDER — SODIUM CHLORIDE 0.9 % IR SOLN
Status: DC | PRN
  Administered 2017-07-13: 18:00:00

## 2017-07-13 MED ORDER — LIDOCAINE 2% (20 MG/ML) 5 ML SYRINGE
INTRAMUSCULAR | Status: DC | PRN
  Administered 2017-07-13: 100 mg via INTRAVENOUS

## 2017-07-13 MED ORDER — MENTHOL 3 MG MT LOZG: 1.0000 | LOZENGE | OROMUCOSAL | Status: DC | PRN

## 2017-07-13 MED ORDER — HYDROMORPHONE HCL 1 MG/ML IJ SOLN
INTRAMUSCULAR | Status: AC
  Administered 2017-07-13: 0.5 mg via INTRAVENOUS
  Filled 2017-07-13: qty 1

## 2017-07-13 MED ORDER — VANCOMYCIN HCL 1000 MG IV SOLR
INTRAVENOUS | Status: DC | PRN
  Administered 2017-07-13: 1000 mg via TOPICAL

## 2017-07-13 MED ORDER — PROPOFOL 10 MG/ML IV BOLUS
INTRAVENOUS | Status: DC | PRN
  Administered 2017-07-13: 200 mg via INTRAVENOUS

## 2017-07-13 MED ORDER — PHENOL 1.4 % MT LIQD: 1.0000 | OROMUCOSAL | Status: DC | PRN

## 2017-07-13 MED ORDER — ARTIFICIAL TEARS OPHTHALMIC OINT
TOPICAL_OINTMENT | OPHTHALMIC | Status: DC | PRN
  Administered 2017-07-13: 1 via OPHTHALMIC

## 2017-07-13 MED ORDER — PROPOFOL 10 MG/ML IV BOLUS
INTRAVENOUS | Status: AC
Start: 2017-07-13 — End: 2017-07-13
  Filled 2017-07-13: qty 20

## 2017-07-13 MED ORDER — AMLODIPINE BESYLATE 10 MG PO TABS
10.0000 mg | ORAL_TABLET | Freq: Every morning | ORAL | Status: DC
  Administered 2017-07-14: 10 mg via ORAL
  Filled 2017-07-13: qty 1

## 2017-07-13 MED ORDER — ACETAMINOPHEN 325 MG PO TABS
650.0000 mg | ORAL_TABLET | ORAL | Status: DC | PRN
  Administered 2017-07-13: 650 mg via ORAL
  Filled 2017-07-13 (×2): qty 2

## 2017-07-13 MED ORDER — PHENYLEPHRINE 40 MCG/ML (10ML) SYRINGE FOR IV PUSH (FOR BLOOD PRESSURE SUPPORT)
PREFILLED_SYRINGE | INTRAVENOUS | Status: DC | PRN
  Administered 2017-07-13: 80 ug via INTRAVENOUS

## 2017-07-13 MED ORDER — CYCLOBENZAPRINE HCL 10 MG PO TABS
10.0000 mg | ORAL_TABLET | Freq: Three times a day (TID) | ORAL | Status: DC | PRN
  Administered 2017-07-13: 10 mg via ORAL
  Filled 2017-07-13 (×2): qty 1

## 2017-07-13 MED ORDER — BUPIVACAINE HCL (PF) 0.25 % IJ SOLN
INTRAMUSCULAR | Status: AC
  Filled 2017-07-13: qty 30

## 2017-07-13 MED ORDER — HYDROMORPHONE HCL 1 MG/ML IJ SOLN
0.2500 mg | INTRAMUSCULAR | Status: DC | PRN
  Administered 2017-07-13 (×2): 0.5 mg via INTRAVENOUS

## 2017-07-13 MED ORDER — BUPIVACAINE HCL (PF) 0.25 % IJ SOLN
INTRAMUSCULAR | Status: DC | PRN
  Administered 2017-07-13: 5 mL

## 2017-07-13 MED ORDER — EZETIMIBE-SIMVASTATIN 10-40 MG PO TABS
1.0000 | ORAL_TABLET | Freq: Every morning | ORAL | Status: DC
  Filled 2017-07-13: qty 1

## 2017-07-13 MED ORDER — ROCURONIUM BROMIDE 100 MG/10ML IV SOLN
INTRAVENOUS | Status: DC | PRN
  Administered 2017-07-13: 70 mg via INTRAVENOUS
  Administered 2017-07-13: 20 mg via INTRAVENOUS

## 2017-07-13 MED ORDER — DEXAMETHASONE SODIUM PHOSPHATE 10 MG/ML IJ SOLN
INTRAMUSCULAR | Status: AC
  Filled 2017-07-13: qty 1

## 2017-07-13 MED ORDER — LIDOCAINE 2% (20 MG/ML) 5 ML SYRINGE
INTRAMUSCULAR | Status: AC
  Filled 2017-07-13: qty 5

## 2017-07-13 MED ORDER — PHENYLEPHRINE 40 MCG/ML (10ML) SYRINGE FOR IV PUSH (FOR BLOOD PRESSURE SUPPORT)
PREFILLED_SYRINGE | INTRAVENOUS | Status: AC
  Filled 2017-07-13: qty 10

## 2017-07-13 MED ORDER — MIDAZOLAM HCL 5 MG/5ML IJ SOLN
INTRAMUSCULAR | Status: DC | PRN
  Administered 2017-07-13: 2 mg via INTRAVENOUS

## 2017-07-13 MED ORDER — THROMBIN (RECOMBINANT) 5000 UNITS EX SOLR
CUTANEOUS | Status: DC | PRN
  Administered 2017-07-13: 18:00:00 via TOPICAL

## 2017-07-13 MED ORDER — CEFAZOLIN SODIUM-DEXTROSE 1-4 GM/50ML-% IV SOLN
INTRAVENOUS | Status: DC | PRN
  Administered 2017-07-13: 2 g via INTRAVENOUS

## 2017-07-13 MED ORDER — SODIUM CHLORIDE 0.9% FLUSH: 3.0000 mL | Freq: Two times a day (BID) | INTRAVENOUS | Status: DC

## 2017-07-13 MED ORDER — ACETAMINOPHEN 650 MG RE SUPP: 650.0000 mg | RECTAL | Status: DC | PRN

## 2017-07-13 MED ORDER — SODIUM CHLORIDE 0.9% FLUSH: 3.0000 mL | INTRAVENOUS | Status: DC | PRN

## 2017-07-13 MED ORDER — CEFAZOLIN SODIUM-DEXTROSE 2-4 GM/100ML-% IV SOLN
INTRAVENOUS | Status: AC
  Filled 2017-07-13: qty 100

## 2017-07-13 MED ORDER — VANCOMYCIN HCL 1000 MG IV SOLR
INTRAVENOUS | Status: AC
  Filled 2017-07-13: qty 1000

## 2017-07-13 MED ORDER — FUROSEMIDE 40 MG PO TABS
40.0000 mg | ORAL_TABLET | Freq: Every morning | ORAL | Status: DC
  Administered 2017-07-14: 40 mg via ORAL
  Filled 2017-07-13: qty 1

## 2017-07-13 MED ORDER — CEFAZOLIN SODIUM-DEXTROSE 2-4 GM/100ML-% IV SOLN
2.0000 g | Freq: Three times a day (TID) | INTRAVENOUS | Status: DC
  Administered 2017-07-14: 2 g via INTRAVENOUS
  Filled 2017-07-13 (×2): qty 100

## 2017-07-13 MED ORDER — CELECOXIB 200 MG PO CAPS
200.0000 mg | ORAL_CAPSULE | Freq: Two times a day (BID) | ORAL | Status: DC
  Administered 2017-07-13 – 2017-07-14 (×2): 200 mg via ORAL
  Filled 2017-07-13 (×2): qty 1

## 2017-07-13 MED ORDER — SUGAMMADEX SODIUM 200 MG/2ML IV SOLN
INTRAVENOUS | Status: AC
  Filled 2017-07-13: qty 2

## 2017-07-13 MED ORDER — POTASSIUM CHLORIDE IN NACL 20-0.9 MEQ/L-% IV SOLN
INTRAVENOUS | Status: DC
  Administered 2017-07-13: 21:00:00 via INTRAVENOUS
  Filled 2017-07-13: qty 1000

## 2017-07-13 MED ORDER — GUANFACINE HCL ER 1 MG PO TB24
1.0000 mg | ORAL_TABLET | Freq: Every day | ORAL | Status: DC
  Administered 2017-07-13: 1 mg via ORAL
  Filled 2017-07-13: qty 1

## 2017-07-13 MED ORDER — LOSARTAN POTASSIUM 50 MG PO TABS
100.0000 mg | ORAL_TABLET | Freq: Every morning | ORAL | Status: DC
  Administered 2017-07-14: 100 mg via ORAL
  Filled 2017-07-13: qty 2

## 2017-07-13 MED ORDER — HYDROMORPHONE HCL 1 MG/ML IJ SOLN
0.5000 mg | INTRAMUSCULAR | Status: DC | PRN
  Administered 2017-07-13: 0.5 mg via INTRAVENOUS
  Filled 2017-07-13: qty 0.5

## 2017-07-13 MED ORDER — OXYCODONE HCL 5 MG PO TABS
ORAL_TABLET | ORAL | Status: AC
  Filled 2017-07-13: qty 1

## 2017-07-13 MED ORDER — LACTATED RINGERS IV SOLN
INTRAVENOUS | Status: DC
  Administered 2017-07-13: 13:00:00 via INTRAVENOUS

## 2017-07-13 MED ORDER — FENTANYL CITRATE (PF) 100 MCG/2ML IJ SOLN
INTRAMUSCULAR | Status: DC | PRN
  Administered 2017-07-13 (×2): 50 ug via INTRAVENOUS
  Administered 2017-07-13: 100 ug via INTRAVENOUS
  Administered 2017-07-13: 50 ug via INTRAVENOUS

## 2017-07-13 MED ORDER — SENNA 8.6 MG PO TABS
1.0000 | ORAL_TABLET | Freq: Two times a day (BID) | ORAL | Status: DC
  Administered 2017-07-13 – 2017-07-14 (×2): 8.6 mg via ORAL
  Filled 2017-07-13 (×2): qty 1

## 2017-07-13 MED ORDER — MIDAZOLAM HCL 2 MG/2ML IJ SOLN
INTRAMUSCULAR | Status: AC
  Filled 2017-07-13: qty 2

## 2017-07-13 MED ORDER — PANTOPRAZOLE SODIUM 40 MG PO TBEC
40.0000 mg | DELAYED_RELEASE_TABLET | Freq: Every day | ORAL | Status: DC
  Administered 2017-07-14: 40 mg via ORAL
  Filled 2017-07-13: qty 1

## 2017-07-13 MED ORDER — ASPIRIN 325 MG PO TABS
325.0000 mg | ORAL_TABLET | ORAL | Status: DC
  Administered 2017-07-14: 325 mg via ORAL
  Filled 2017-07-13: qty 1

## 2017-07-13 MED ORDER — PROMETHAZINE HCL 25 MG/ML IJ SOLN
6.2500 mg | INTRAMUSCULAR | Status: DC | PRN
Start: 2017-07-13 — End: 2017-07-13

## 2017-07-13 SURGICAL SUPPLY — 71 items
ADH SKN CLS APL DERMABOND .7 (GAUZE/BANDAGES/DRESSINGS) ×1
APL SKNCLS STERI-STRIP NONHPOA (GAUZE/BANDAGES/DRESSINGS) ×1
BAG DECANTER FOR FLEXI CONT (MISCELLANEOUS) ×3 IMPLANT
BASKET BONE COLLECTION (BASKET) ×2 IMPLANT
BENZOIN TINCTURE PRP APPL 2/3 (GAUZE/BANDAGES/DRESSINGS) ×3 IMPLANT
BIT DRILL PLIF MAS DISP 5.5MM (DRILL) IMPLANT
BONE CANC CHIPS 40CC CAN1/2 (Bone Implant) ×3 IMPLANT
BUR MATCHSTICK NEURO 3.0 LAGG (BURR) ×3 IMPLANT
CANISTER SUCT 3000ML PPV (MISCELLANEOUS) ×3 IMPLANT
CARTRIDGE OIL MAESTRO DRILL (MISCELLANEOUS) ×1 IMPLANT
CHIPS CANC BONE 40CC CAN1/2 (Bone Implant) ×1 IMPLANT
CLOSURE WOUND 1/2 X4 (GAUZE/BANDAGES/DRESSINGS) ×1
CONT SPEC 4OZ CLIKSEAL STRL BL (MISCELLANEOUS) ×3 IMPLANT
COVER BACK TABLE 60X90IN (DRAPES) ×3 IMPLANT
DERMABOND ADVANCED (GAUZE/BANDAGES/DRESSINGS) ×2
DERMABOND ADVANCED .7 DNX12 (GAUZE/BANDAGES/DRESSINGS) IMPLANT
DIFFUSER DRILL AIR PNEUMATIC (MISCELLANEOUS) ×3 IMPLANT
DRAPE C-ARM 42X72 X-RAY (DRAPES) ×6 IMPLANT
DRAPE LAPAROTOMY 100X72X124 (DRAPES) ×3 IMPLANT
DRAPE POUCH INSTRU U-SHP 10X18 (DRAPES) ×3 IMPLANT
DRAPE SURG 17X23 STRL (DRAPES) ×3 IMPLANT
DRILL PLIF MAS DISP 5.5MM (DRILL) ×3
DRSG OPSITE POSTOP 4X8 (GAUZE/BANDAGES/DRESSINGS) ×2 IMPLANT
DURAPREP 26ML APPLICATOR (WOUND CARE) ×3 IMPLANT
ELECT REM PT RETURN 9FT ADLT (ELECTROSURGICAL) ×3
ELECTRODE REM PT RTRN 9FT ADLT (ELECTROSURGICAL) ×1 IMPLANT
EVACUATOR 1/8 PVC DRAIN (DRAIN) IMPLANT
GAUZE SPONGE 4X4 16PLY XRAY LF (GAUZE/BANDAGES/DRESSINGS) IMPLANT
GLOVE BIO SURGEON STRL SZ7 (GLOVE) ×4 IMPLANT
GLOVE BIO SURGEON STRL SZ8 (GLOVE) ×6 IMPLANT
GLOVE BIOGEL PI IND STRL 7.0 (GLOVE) IMPLANT
GLOVE BIOGEL PI IND STRL 7.5 (GLOVE) ×2 IMPLANT
GLOVE BIOGEL PI INDICATOR 7.0 (GLOVE) ×4
GLOVE BIOGEL PI INDICATOR 7.5 (GLOVE) ×4
GLOVE ECLIPSE 9.0 STRL (GLOVE) ×2 IMPLANT
GLOVE SS N UNI LF 6.5 STRL (GLOVE) ×4 IMPLANT
GLOVE SS N UNI LF 7.0 STRL (GLOVE) ×4 IMPLANT
GLOVE SS N UNI LF 7.5 STRL (GLOVE) ×4 IMPLANT
GOWN STRL REUS W/ TWL LRG LVL3 (GOWN DISPOSABLE) IMPLANT
GOWN STRL REUS W/ TWL XL LVL3 (GOWN DISPOSABLE) ×2 IMPLANT
GOWN STRL REUS W/TWL 2XL LVL3 (GOWN DISPOSABLE) IMPLANT
GOWN STRL REUS W/TWL LRG LVL3 (GOWN DISPOSABLE) ×9
GOWN STRL REUS W/TWL XL LVL3 (GOWN DISPOSABLE) ×6
GRAFT BNE CHIP CANC 1-8 40 (Bone Implant) IMPLANT
HEMOSTAT POWDER KIT SURGIFOAM (HEMOSTASIS) ×2 IMPLANT
KIT BASIN OR (CUSTOM PROCEDURE TRAY) ×3 IMPLANT
KIT ROOM TURNOVER OR (KITS) ×3 IMPLANT
NEEDLE HYPO 25X1 1.5 SAFETY (NEEDLE) ×3 IMPLANT
NS IRRIG 1000ML POUR BTL (IV SOLUTION) ×3 IMPLANT
OIL CARTRIDGE MAESTRO DRILL (MISCELLANEOUS) ×3
PACK LAMINECTOMY NEURO (CUSTOM PROCEDURE TRAY) ×3 IMPLANT
PAD ARMBOARD 7.5X6 YLW CONV (MISCELLANEOUS) ×9 IMPLANT
ROD 100MM (Rod) ×6 IMPLANT
ROD SPNL 100XPREBNT NS MAS (Rod) IMPLANT
SCREW LOCK (Screw) ×24 IMPLANT
SCREW LOCK FXNS SPNE MAS PL (Screw) IMPLANT
SCREW SHANK 5.5X30MM (Screw) ×2 IMPLANT
SCREW SHANKS 5.5X35 (Screw) ×6 IMPLANT
SCREW TULIP 5.5 (Screw) ×8 IMPLANT
SPONGE LAP 4X18 X RAY DECT (DISPOSABLE) IMPLANT
SPONGE SURGIFOAM ABS GEL 100 (HEMOSTASIS) ×3 IMPLANT
STRIP BIOACTIVE 20CC 25X100X8 (Miscellaneous) ×2 IMPLANT
STRIP CLOSURE SKIN 1/2X4 (GAUZE/BANDAGES/DRESSINGS) ×3 IMPLANT
SUT VIC AB 0 CT1 18XCR BRD8 (SUTURE) ×1 IMPLANT
SUT VIC AB 0 CT1 8-18 (SUTURE) ×3
SUT VIC AB 2-0 CP2 18 (SUTURE) ×3 IMPLANT
SUT VIC AB 3-0 SH 8-18 (SUTURE) ×6 IMPLANT
TOWEL GREEN STERILE (TOWEL DISPOSABLE) ×3 IMPLANT
TOWEL GREEN STERILE FF (TOWEL DISPOSABLE) ×3 IMPLANT
TRAY FOLEY W/METER SILVER 16FR (SET/KITS/TRAYS/PACK) ×2 IMPLANT
WATER STERILE IRR 1000ML POUR (IV SOLUTION) ×3 IMPLANT

## 2017-07-13 NOTE — Anesthesia Procedure Notes (Signed)
Procedure Name: Intubation Date/Time: 07/13/2017 4:00 PM Performed by: Bryson Corona, CRNA Pre-anesthesia Checklist: Patient identified, Emergency Drugs available, Suction available and Patient being monitored Patient Re-evaluated:Patient Re-evaluated prior to induction Oxygen Delivery Method: Circle System Utilized Preoxygenation: Pre-oxygenation with 100% oxygen Induction Type: IV induction Ventilation: Oral airway inserted - appropriate to patient size Laryngoscope Size: Mac and 4 Grade View: Grade I Tube type: Oral Number of attempts: 1 Airway Equipment and Method: Stylet and Oral airway Placement Confirmation: ETT inserted through vocal cords under direct vision,  positive ETCO2 and breath sounds checked- equal and bilateral Secured at: 21 cm Tube secured with: Tape Dental Injury: Teeth and Oropharynx as per pre-operative assessment

## 2017-07-13 NOTE — Anesthesia Preprocedure Evaluation (Signed)
Anesthesia Evaluation  Patient identified by MRN, date of birth, ID band Patient awake    Reviewed: Allergy & Precautions, NPO status , Patient's Chart, lab work & pertinent test results  History of Anesthesia Complications Negative for: history of anesthetic complications  Airway Mallampati: II  TM Distance: >3 FB Neck ROM: Full    Dental no notable dental hx. (+) Dental Advisory Given, Caps, Teeth Intact,    Pulmonary sleep apnea , former smoker,    Pulmonary exam normal        Cardiovascular hypertension, Pt. on medications negative cardio ROS Normal cardiovascular exam Rhythm:Regular Rate:Normal     Neuro/Psych negative neurological ROS  negative psych ROS   GI/Hepatic Neg liver ROS, GERD  Controlled,  Endo/Other  negative endocrine ROS  Renal/GU negative Renal ROS     Musculoskeletal  (+) Arthritis ,   Abdominal   Peds  Hematology negative hematology ROS (+)   Anesthesia Other Findings Day of surgery medications reviewed with the patient.  Reproductive/Obstetrics                             Anesthesia Physical  Anesthesia Plan  ASA: II  Anesthesia Plan: General   Post-op Pain Management:    Induction: Intravenous  PONV Risk Score and Plan: 2 and Ondansetron and Dexamethasone  Airway Management Planned: Oral ETT  Additional Equipment:   Intra-op Plan:   Post-operative Plan: Extubation in OR  Informed Consent: I have reviewed the patients History and Physical, chart, labs and discussed the procedure including the risks, benefits and alternatives for the proposed anesthesia with the patient or authorized representative who has indicated his/her understanding and acceptance.   Dental advisory given  Plan Discussed with: CRNA and Anesthesiologist  Anesthesia Plan Comments:         Anesthesia Quick Evaluation

## 2017-07-13 NOTE — Op Note (Signed)
07/13/2017  6:38 PM  PATIENT:  Keith Webb  67 y.o. male  PRE-OPERATIVE DIAGNOSIS:  Severe degenerative disc disease with instability scoliosis and stenosis at L2-3 with back and bilateral leg pain  POST-OPERATIVE DIAGNOSIS:  same  PROCEDURE:   1. Decompressive lumbar laminectomy L2-3 bilateral with medial facetectomy and foraminotomy 2. Posterior fixation L1-L4 inclusive using  nuvasive cortical pedicle screws.  3. Intertransverse arthrodesis L1-L4 using morcellized autograft and allograft soaked with bone marrow aspirate. 4. Removal of segmental fixation L3-L5   SURGEON:  Sherley Bounds, MD  ASSISTANTS:  Meyran FNP  ANESTHESIA:  General  EBL: 150 ml  Total I/O In: 1600 [I.V.:1600] Out: 375 [Urine:225; Blood:150]  BLOOD ADMINISTERED:none  DRAINS: none   INDICATION FOR PROCEDURE: This patient presented with severe back and leg pain. Imaging revealed severe disease at L2-3 with moth-eaten disc space, scoliosis, instability, and stenosis.. The patient tried a reasonable attempt at conservative medical measures without relief. I recommended decompression and instrumented fusion to address the stenosis as well as the segmental  instability.  Patient understood the risks, benefits, and alternatives and potential outcomes and wished to proceed.  PROCEDURE DETAILS:  The patient was brought to the operating room. After induction of generalized endotracheal anesthesia the patient was rolled into the prone position on chest rolls and all pressure points were padded. The patient's lumbar region was cleaned and then prepped with DuraPrep and draped in the usual sterile fashion. Anesthesia was injected and then a dorsal midline incision was made and carried down to the lumbosacral fascia. The fascia was opened and the paraspinous musculature was taken down in a subperiosteal fashion to expose L1-L5 as well as the previously placed hardware. The locking caps were removed and the rods were  removed. I removed the L5 pedicle screws. The L3 and L4 pedicle screws had good purchase. A self-retaining retractor was placed. Intraoperative fluoroscopy confirmed my level, and I started with placement of the L1 and L2 cortical pedicle screws. The pedicle screw entry zones were identified utilizing surface landmarks and  AP and lateral fluoroscopy. I scored the cortex with the high-speed drill and then used the hand drill to drill an upward and outward direction into the pedicle. I then tapped line to line. I then placed a 5.5 x 35 mm cortical pedicle screw into the pedicles of L1 and L2 bilaterally. I then turned my attention to the decompression and complete lumbar laminectomies, hemi- facetectomies, and foraminotomies were performed at L2-3. The patient had significant spinal stenosis. Much more generous decompression and generous foraminotomy was undertaken in order to adequately decompress the neural elements and address the patient's leg pain. The yellow ligament was removed to expose the underlying dura and nerve roots, and generous foraminotomies were performed to adequately decompress the neural elements. Both the exiting and traversing nerve roots were decompressed on both sides until a coronary dilator passed easily along the nerve roots.  We then decorticated the transverse processes from L1-L4 bilaterally and laid a mixture of morcellized autograft and bone marrow soaked allograft out over these to perform intertransverse arthrodesis at L1-L4. We then placed lordotic rods into the multiaxial screw heads of the pedicle screws and locked these in position with the locking caps and anti-torque device. We then checked our construct with AP and lateral fluoroscopy. Irrigated with copious amounts of bacitracin-containing saline solution. Inspected the nerve roots once again to assure adequate decompression, lined to the dura with Gelfoam, placed powdered vancomycin into the wound, and closed the  muscle and  the fascia with 0 Vicryl. Closed the subcutaneous tissues with 2-0 Vicryl and subcuticular tissues with 3-0 Vicryl. The skin was closed with benzoin and Steri-Strips. Dressing was then applied, the patient was awakened from general anesthesia and transported to the recovery room in stable condition. At the end of the procedure all sponge, needle and instrument counts were correct.   PLAN OF CARE: admit to inpatient  PATIENT DISPOSITION:  PACU - hemodynamically stable.   Delay start of Pharmacological VTE agent (>24hrs) due to surgical blood loss or risk of bleeding:  yes

## 2017-07-13 NOTE — Anesthesia Postprocedure Evaluation (Signed)
Anesthesia Post Note  Patient: Keith Webb  Procedure(s) Performed: Laminectomy Lumbar Two-Three, Posterior Lateral Fusion - Lumbar One-Two-Lumbar Two-Three, extension of pedicle screw fixation  -Lumbar One-Lumbar Five (N/A Back)     Patient location during evaluation: PACU Anesthesia Type: General Level of consciousness: awake and alert, patient cooperative and oriented Pain management: pain level controlled Vital Signs Assessment: post-procedure vital signs reviewed and stable Respiratory status: spontaneous breathing, nonlabored ventilation, respiratory function stable and patient connected to nasal cannula oxygen Cardiovascular status: blood pressure returned to baseline and stable Postop Assessment: no apparent nausea or vomiting Anesthetic complications: no    Last Vitals:  Vitals:   07/13/17 1945 07/13/17 2009  BP: 119/79 132/86  Pulse: (!) 57 74  Resp: 12 18  Temp: 36.7 C 36.7 C  SpO2: 100% 98%    Last Pain:  Vitals:   07/13/17 2009  TempSrc: Oral  PainSc:                  [name],E. Lewin Pellow

## 2017-07-13 NOTE — Progress Notes (Signed)
Pt admitted to the unit from pacu; pt A&O x4; MAE x4; back incision dsg clean, dry and intact with no stain or active drainage noted. Pt oriented to the unit and room; fall/safety precaution and prevention education completed. Call light within reach and pt in bed. Will closely monitor. Delia Heady RN

## 2017-07-13 NOTE — Transfer of Care (Signed)
Immediate Anesthesia Transfer of Care Note  Patient: Keith Webb  Procedure(s) Performed: Laminectomy Lumbar Two-Three, Posterior Lateral Fusion - Lumbar One-Two-Lumbar Two-Three, extension of pedicle screw fixation  -Lumbar One-Lumbar Five (N/A Back)  Patient Location: PACU  Anesthesia Type:General  Level of Consciousness: awake, alert  and oriented  Airway & Oxygen Therapy: Patient Spontanous Breathing and Patient connected to face mask oxygen  Post-op Assessment: Report given to RN and Post -op Vital signs reviewed and stable  Post vital signs: Reviewed and stable  Last Vitals:  Vitals:   07/13/17 1236 07/13/17 1846  BP: 119/81 (!) 114/91  Pulse: 90 68  Resp: 20 (!) 8  Temp: 36.8 C   SpO2: 100% 100%    Last Pain:  Vitals:   07/13/17 1236  TempSrc: Oral         Complications: No apparent anesthesia complications

## 2017-07-13 NOTE — H&P (Signed)
Subjective: Patient is a 67 y.o. male admitted for back and leg pain. Onset of symptoms was several months ago, gradually worsening since that time.  The pain is rated severe, and is located at the across the lower back and radiates to legs. The pain is described as aching and occurs all day. The symptoms have been progressive. Symptoms are exacerbated by exercise. MRI or CT showed stenosis/ DDD/ scoliosis   Past Medical History:  Diagnosis Date  . Arthritis   . Environmental and seasonal allergies   . GERD (gastroesophageal reflux disease)   . Heart murmur   . History of syncope    2006  orthostatic hypotension w/ dehydration  . Hypercholesterolemia   . Hypertension   . OSA (obstructive sleep apnea)    per pt used cpap for awhile but stopped because  didn't think he needed any more  . Recurrent prostate cancer [facility]) urologist-  dr budzyni/  oncologist-- dr Valere Dross   first dx 1998 (Gleason 7,  PSA 18.5) s/p  radical prostatectomy---  recurrent 10/ 2013  w/ salvage external radiation 06-24-2014 to 08-10-2014/  per pt (07-21-2016)  last PSA <5  . S/P radiation therapy 06/24/2014 through 08/10/2014   External beam of Prostate bed 6600 cGy in 33 sessions   . Wears glasses     Past Surgical History:  Procedure Laterality Date  . POSTERIOR LAMINECTOMY / DECOMPRESSION LUMBAR SPINE  12/09/2009   W/  DECOMPRESSION AND FUSION L5--S1/  REMOVAL EPIDURAL SYNOVIAL CYST (per path no neoplasm present)  . POSTERIOR LAMINECTOMY / DECOMPRESSION LUMBAR SPINE  09/10/2015   decompressive laminectomy L4-5 left/  fixation and arthrodesis L3-5 /  removal hardware L5-S1  . PROSTATECTOMY  04/01/1997   radical w/ bilateral pelvic lymph node dissection  . RECTAL EXAM UNDER ANESTHESIA N/A 07/27/2016   Procedure: RECTAL EXAM UNDER ANESTHESIA;  Surgeon: Michael Boston, MD;  Location: [facility];  Service: General;  Laterality: N/A;   Hemorrhoidal ligation, suture pexy  . RIGHT INDEX FINGER SYNOVECTOMY/ EXCISION GIANT CELL TUMOR  02/19/2001  . TOTAL HIP ARTHROPLASTY Left 12/08/2002  . TRANSTHORACIC ECHOCARDIOGRAM  01/03/2005   ef 55-65%/  trivial AR and MR    Prior to Admission medications   Medication Sig Start Date End Date Taking? Authorizing Provider  amLODipine (NORVASC) 10 MG tablet Take 10 mg by mouth every morning.    Yes [provider]  aspirin 325 MG tablet Take 325 mg by mouth every other day.    Yes [provider]  cyclobenzaprine (FLEXERIL) 10 MG tablet Take 10 mg by mouth 3 (three) times daily as needed for muscle spasms.   Yes [provider]  dexlansoprazole (DEXILANT) 60 MG capsule Take 60 mg by mouth every morning.    Yes [provider]  Diclofenac (ZORVOLEX) 35 MG CAPS Take 35 mg by mouth 3 (three) times daily.   Yes [provider]  ezetimibe-simvastatin (VYTORIN) 10-40 MG per tablet Take 1 tablet by mouth every morning.    Yes [provider]  furosemide (LASIX) 40 MG tablet Take 40 mg by mouth every morning.    Yes [provider]  guanFACINE (INTUNIV) 1 MG TB24 Take 1 mg by mouth at bedtime.    Yes [provider]  IRON PO Take 1 tablet by mouth daily.   Yes [provider]  losartan (COZAAR) 100 MG tablet Take 100 mg by mouth every morning.    Yes [provider]  Misc Natural Products (GLUCOSAMINE CHOND  DOUBLE STR PO) Take 1 tablet by mouth daily.   Yes [provider]  montelukast (SINGULAIR) 10 MG tablet Take 10 mg by mouth at bedtime.    Yes [provider]  Multiple Vitamins-Minerals (CENTRUM SILVER PO) Take 1 tablet by mouth daily.   Yes [provider]  sildenafil (VIAGRA) 100 MG tablet Take 100 mg by mouth daily as needed for erectile dysfunction.   Yes [provider]   No Known Allergies  Social History   Tobacco Use  . Smoking status: Former Smoker    Years:  30.00    Types: Cigars    Last attempt to quit: 05/22/2004    Years since quitting: 13.1  . Smokeless tobacco: Never Used  Substance Use Topics  . Alcohol use: No    Family History  Problem Relation Age of Onset  . Stroke Mother   . Diabetes Sister   . Cancer Brother        prostate, surgery     Review of Systems  Positive ROS: neg  All other systems have been reviewed and were otherwise negative with the exception of those mentioned in the HPI and as above.  Objective: Vital signs in last 24 hours: Temp:  [98.3 F (36.8 C)] 98.3 F (36.8 C) (02/01 1236) Pulse Rate:  [90] 90 (02/01 1236) Resp:  [20] 20 (02/01 1236) BP: (119)/(81) 119/81 (02/01 1236) SpO2:  [100 %] 100 % (02/01 1236) Weight:  [88 kg (194 lb)] 88 kg (194 lb) (02/01 1251)  General Appearance: Alert, cooperative, no distress, appears stated age Head: Normocephalic, without obvious abnormality, atraumatic Eyes: PERRL, conjunctiva/corneas clear, EOM's intact    Neck: Supple, symmetrical, trachea midline Back: Symmetric, no curvature, ROM normal, no CVA tenderness Lungs:  respirations unlabored Heart: Regular rate and rhythm Abdomen: Soft, non-tender Extremities: Extremities normal, atraumatic, no cyanosis or edema Pulses: 2+ and symmetric all extremities Skin: Skin color, texture, turgor normal, no rashes or lesions  NEUROLOGIC:   Mental status: Alert and oriented x4,  no aphasia, good attention span, fund of knowledge, and memory Motor Exam - grossly normal Sensory Exam - grossly normal Reflexes: trace  Coordination - grossly normal Gait - grossly normal Balance - grossly normal Cranial Nerves: I: smell Not tested  II: visual acuity  OS: nl    OD: nl  II: visual fields Full to confrontation  II: pupils Equal, round, reactive to light  III,VII: ptosis None  III,IV,VI: extraocular muscles  Full ROM  V: mastication Normal  V: facial light touch sensation  Normal  V,VII: corneal reflex  Present   VII: facial muscle function - upper  Normal  VII: facial muscle function - lower Normal  VIII: hearing Not tested  IX: soft palate elevation  Normal  IX,X: gag reflex Present  XI: trapezius strength  5/5  XI: sternocleidomastoid strength 5/5  XI: neck flexion strength  5/5  XII: tongue strength  Normal    Data Review Lab Results  Component Value Date   WBC 6.3 07/10/2017   HGB 13.2 07/10/2017   HCT 39.7 07/10/2017   MCV 92.5 07/10/2017   PLT 277 07/10/2017   Lab Results  Component Value Date   NA 139 07/10/2017   K 4.6 07/10/2017   CL 107 07/10/2017   CO2 24 07/10/2017   BUN 24 (H) 07/10/2017   CREATININE 1.29 (H) 07/10/2017   GLUCOSE 77 07/10/2017   Lab Results  Component Value Date   INR 1.03 09/03/2015  Assessment/Plan:  Estimated body mass index is 29.5 kg/m as calculated from the following:   Height as of this encounter: 5\' 8"  (1.727 m).   Weight as of this encounter: 88 kg (194 lb). Patient admitted for L1-3 decompression and fusion. Patient has failed a reasonable attempt at conservative therapy.  I explained the condition and procedure to the patient and answered any questions.  Patient wishes to proceed with procedure as planned. Understands risks/ benefits and typical outcomes of procedure.   JONES,DAVID S 07/13/2017 3:39 PM

## 2017-07-13 NOTE — OR Nursing (Signed)
I attempted to call phone report to RN on 3W for handoff report.  NS stated that the charge RN has not assigned a nurse to the room and to please call back.

## 2017-07-14 MED ORDER — OXYCODONE-ACETAMINOPHEN 7.5-325 MG PO TABS: 1.0000 | ORAL_TABLET | ORAL | 0 refills | Status: DC | PRN

## 2017-07-14 MED ORDER — CYCLOBENZAPRINE HCL 10 MG PO TABS: 10.0000 mg | ORAL_TABLET | Freq: Three times a day (TID) | ORAL | 0 refills | Status: DC | PRN

## 2017-07-14 NOTE — Evaluation (Signed)
Physical Therapy Evaluation & Discharge  Patient Details Name: Keith Webb MRN: [id number] DOB: Oct 07, 1950 Today's Date: 07/14/2017   History of Present Illness  Pt is a 67 y/o male s/p PLF L1-L3. PMH including but not limited to HTN, recurrent prostate cancer, history of lumbar surgeries (2011, 2017).    Clinical Impression  Pt presented in hallway ambulating with use of RW, spouse and MD present. Prior to admission, pt reported that he was independent with all functional mobility and ADLs. Pt lives with his spouse who will be able to provide 24/7 supervision/assistance upon d/c. PT reviewed 3/3 back precautions with pt and pt's spouse throughout session. Pt practiced log roll technique with cueing, no physical assistance needed. Pt also successfully completed stair training this session with spouse present. Pt with no further concerns or questions. No further acute PT needs identified at this time. PT signing off.     Follow Up Recommendations No PT follow up;Supervision - Intermittent    Equipment Recommendations  None recommended by PT    Recommendations for Other Services       Precautions / Restrictions Precautions Precautions: Back Precaution Booklet Issued: No Precaution Comments: PT reviewed 3/3 back precautions with pt and pt's spouse; reviewed log roll technique with pt returning demonstration Required Braces or Orthoses: Spinal Brace Spinal Brace: Lumbar corset Restrictions Weight Bearing Restrictions: No      Mobility  Bed Mobility Overal bed mobility: Needs Assistance Bed Mobility: Rolling;Sidelying to Sit;Sit to Sidelying Rolling: Supervision Sidelying to sit: Supervision     Sit to sidelying: Supervision General bed mobility comments: with cueing for log roll technique  Transfers Overall transfer level: Modified independent Equipment used: None                Ambulation/Gait Ambulation/Gait assistance: Modified independent (Device/Increase  time) Ambulation Distance (Feet): 300 Feet Assistive device: Rolling walker (2 wheeled);None Gait Pattern/deviations: Step-through pattern;Decreased step length - right;Decreased step length - left;Decreased stride length;Antalgic Gait velocity: decreased Gait velocity interpretation: Below normal speed for age/gender General Gait Details: pt ambulating in hallway with spouse and MD upon arrival with use of RW; pt steady with RW. Pt able to ambulate without AD as well with more pronounced antalgic gait. No instability, LOB or need for physical assistance  Stairs Stairs: Yes Stairs assistance: Supervision Stair Management: Two rails;Alternating pattern;Step to pattern;Forwards Number of Stairs: 5 General stair comments: no instability or LOB, supervision for safety  Wheelchair Mobility    Modified Rankin (Stroke Patients Only)       Balance Overall balance assessment: No apparent balance deficits (not formally assessed)                                           Pertinent Vitals/Pain Pain Assessment: Faces Faces Pain Scale: Hurts even more Pain Location: back Pain Descriptors / Indicators: Sore Pain Intervention(s): Monitored during session;Repositioned;Patient requesting pain meds-RN notified    Home Living Family/patient expects to be discharged to:: Private residence Living Arrangements: Spouse/significant other Available Help at Discharge: Family;Available 24 hours/day Type of Home: House Home Access: Stairs to enter Entrance Stairs-Rails: Psychiatric nurse of Steps: 3 Home Layout: One level Home Equipment: Walker - standard      Prior Function Level of Independence: Independent               Hand Dominance  Extremity/Trunk Assessment   Upper Extremity Assessment Upper Extremity Assessment: Overall WFL for tasks assessed    Lower Extremity Assessment Lower Extremity Assessment: Overall WFL for tasks assessed     Cervical / Trunk Assessment Cervical / Trunk Assessment: Other exceptions Cervical / Trunk Exceptions: s/p lumbar sx  Communication   Communication: No difficulties  Cognition Arousal/Alertness: Awake/alert Behavior During Therapy: WFL for tasks assessed/performed Overall Cognitive Status: Within Functional Limits for tasks assessed                                        General Comments      Exercises     Assessment/Plan    PT Assessment Patent does not need any further PT services  PT Problem List         PT Treatment Interventions      PT Goals (Current goals can be found in the Care Plan section)  Acute Rehab PT Goals Patient Stated Goal: return home ASAP    Frequency     Barriers to discharge        Co-evaluation               AM-PAC PT "6 Clicks" Daily Activity  Outcome Measure Difficulty turning over in bed (including adjusting bedclothes, sheets and blankets)?: A Little Difficulty moving from lying on back to sitting on the side of the bed? : A Little Difficulty sitting down on and standing up from a chair with arms (e.g., wheelchair, bedside commode, etc,.)?: A Little Help needed moving to and from a bed to chair (including a wheelchair)?: None Help needed walking in hospital room?: None Help needed climbing 3-5 steps with a railing? : None 6 Click Score: 21    End of Session Equipment Utilized During Treatment: Back brace Activity Tolerance: Patient tolerated treatment well Patient left: in bed;with call bell/phone within reach;with nursing/sitter in room;Other (comment)(sitting EOB) Nurse Communication: Mobility status PT Visit Diagnosis: Other abnormalities of gait and mobility (R26.89);Pain Pain - part of body: (back)    Time: 2482-5003 PT Time Calculation (min) (ACUTE ONLY): 11 min   Charges:   PT Evaluation $PT Eval Low Complexity: 1 Low     PT G Codes:        [address], PT, DPT [place] 07/14/2017, 10:05 AM

## 2017-07-14 NOTE — Discharge Summary (Signed)
Physician Discharge Summary  Patient ID: Keith Webb MRN: [id number] DOB/AGE: 1951-06-08 67 y.o.  Admit date: 07/13/2017 Discharge date: 07/14/2017  Admission Diagnoses: adjacent level stenosis    Discharge Diagnoses: same   Discharged Condition: good  Hospital Course: The patient was admitted on 07/13/2017 and taken to the operating room where the patient underwent L1-4 fusion. The patient tolerated the procedure well and was taken to the recovery room and then to the floor in stable condition. The hospital course was routine. There were no complications. The wound remained clean dry and intact. Pt had appropriate back soreness. No complaints of leg pain or new N/T/W. The patient remained afebrile with stable vital signs, and tolerated a regular diet. The patient continued to increase activities, and pain was well controlled with oral pain medications.   Consults: None  Significant Diagnostic Studies:  Results for orders placed or performed during the hospital encounter of 07/10/17  Surgical pcr screen  Result Value Ref Range   MRSA, PCR NEGATIVE NEGATIVE   Staphylococcus aureus NEGATIVE NEGATIVE  Basic metabolic panel  Result Value Ref Range   Sodium 139 135 - 145 mmol/L   Potassium 4.6 3.5 - 5.1 mmol/L   Chloride 107 101 - 111 mmol/L   CO2 24 22 - 32 mmol/L   Glucose, Bld 77 65 - 99 mg/dL   BUN 24 (H) 6 - 20 mg/dL   Creatinine, Ser 1.29 (H) 0.61 - 1.24 mg/dL   Calcium 10.2 8.9 - 10.3 mg/dL   GFR calc non Af Amer 55 (L) >60 mL/min   GFR calc Af Amer >60 >60 mL/min   Anion gap 8 5 - 15  CBC  Result Value Ref Range   WBC 6.3 4.0 - 10.5 K/uL   RBC 4.29 4.22 - 5.81 MIL/uL   Hemoglobin 13.2 13.0 - 17.0 g/dL   HCT 39.7 39.0 - 52.0 %   MCV 92.5 78.0 - 100.0 fL   MCH 30.8 26.0 - 34.0 pg   MCHC 33.2 30.0 - 36.0 g/dL   RDW 14.8 11.5 - 15.5 %   Platelets 277 150 - 400 K/uL  Type and screen All Cardiac and thoracic surgeries, spinal fusions, myomectomies, craniotomies, colon &  liver resections, total joint revisions, same day c-section with placenta previa or accreta.  Result Value Ref Range   ABO/RH(D) O POS    Antibody Screen NEG    Sample Expiration 07/24/2017    Extend sample reason NO TRANSFUSIONS OR PREGNANCY IN THE PAST 3 MONTHS     Dg Lumbar Spine 2-3 Views  Result Date: 07/13/2017 CLINICAL DATA:  INTRAOPERATIVE FLUOROSCOPY: Laminectomy Lumbar Two-Three, Posterior Lateral Fusion - Lumbar One-Two-Lumbar Two-Three, extension of pedicle screw fixation -Lumbar One-Lumbar Five RSTO: [facility] FLUORO TIME: 33 SECS EXAM: DG C-ARM 61-120 MIN; LUMBAR SPINE - 2-3 VIEW COMPARISON:  CT 06/19/2017 FINDINGS: Posterior retractors are in place. Transpedicular screws are placed at L1 and L2 and subsequently the lumbar levels L1-L4 are fused. The transpedicular screws and hardware at L5 is either removed or not imaged. IMPRESSION: Posterior fusion L1-L4. Electronically Signed   By: Nolon Nations M.D.   On: 07/13/2017 20:46   Ct Lumbar Spine W Contrast  Result Date: 06/19/2017 CLINICAL DATA:  Low back pain.  Suspected adjacent segment disease. EXAM: LUMBAR MYELOGRAM FLUOROSCOPY TIME:  25 seconds corresponding to a Dose Area Product of 313.44 Gy*m2 PROCEDURE: After thorough discussion of risks and benefits of the procedure including bleeding, infection, injury to nerves, blood vessels, adjacent structures as  well as headache and CSF leak, written and oral informed consent was obtained. Consent was obtained by Dr. Rolla Flatten. Time out form was completed. Patient was positioned prone on the fluoroscopy table. Local anesthesia was provided with 1% lidocaine without epinephrine after prepped and draped in the usual sterile fashion. Puncture was performed at L5 using a 3 1/2 inch 20 gauge needle via RIGHT paramedian approach. Using a single pass through the dura, the needle was placed within the thecal sac, with return of clear CSF. 15 mL of Isovue M-200 was injected into the thecal sac, with  normal opacification of the nerve roots and cauda equina consistent with free flow within the subarachnoid space. I personally performed the lumbar puncture and administered the intrathecal contrast. I also personally supervised acquisition of the myelogram images. TECHNIQUE: Contiguous axial images were obtained through the Lumbar spine after the intrathecal infusion of infusion. Coronal and sagittal reconstructions were obtained of the axial image sets. COMPARISON:  Most recent MR 03/05/2015.  Most recent CT, 10/04/2016. FINDINGS: LUMBAR MYELOGRAM FINDINGS: Good opacification lumbar subarachnoid space. Previous L4-5 posterolateral and interbody fusion. Previous posterior fusion L3-4, using pedicle screws. 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 BILATERAL RIGHT greater than LEFT L3 nerve root impingement. Marked endplate sclerotic changes at this level, with moderate loss of interspace height. Rightward translation L2 on L3. Mild stenosis L1-L2. Moderate narrowing L1-L2, without associated endplate reactive changes. Straightening of the normal lumbar lordosis, even slight reversal. Trace retrolisthesis L2-3. Trace retrolisthesis L1-2. Large ventral defect consistent with extruded disc material extends cephalad from L2-3 interspace contributing to stenosis. Prominent ligamentum flavum infolding L2-L3. RIGHT greater than LEFT L3 nerve root impingement. Trace retrolisthesis L3-4. With patient standing, no significant dynamic instability is observed through flexion extension. The stenosis at L2-3 may be worse with patient standing, particularly with regard to RIGHT-sided impingement. CT LUMBAR MYELOGRAM FINDINGS: Segmentation: Normal. Alignment: 2 mm retrolisthesis L1-2. Significant rightward translation L2 on L3, as much as 9 mm. Vertebrae: Severe sclerotic and subchondral cystic change, L2 and L3 endplates above and below the L2-L3 interspace.No worrisome osseous lesion. Solid arthrodesis L4-5 and  L5-S1. Despite vacuum phenomenon at L3-4, there appears to be solid posterior arthrodesis across the L3-4 facets. LEFT L5 screw extends into the interspace at L5-S1, but despite this the fusion is solid. Conus medullaris: Normal in size and location. Paraspinal tissues: Unremarkable. Disc levels: L1-L2: Disc space narrowing. Vacuum phenomenon. 2 mm retrolisthesis. Annular bulge. Facet arthropathy. No stenosis or subarticular zone narrowing, but BILATERAL foraminal narrowing could affect either L1 nerve root. L2-L3: Severe adjacent segment disease. Rightward translation L2 on L3. RIGHT L3 screw enters the interspace. LEFT L3 screw is lateral to the L3 vertebral body. Central protrusion with cephalad migrated free fragment. Facet arthropathy with possible RIGHT-sided synovial cyst, 6 mm in size. Facet joint effusions. RIGHT greater than LEFT L3 nerve root impingement. BILATERAL foraminal narrowing could affect either L2 nerve root, also on the LEFT. L3-L4: Solid posterior arthrodesis. Vacuum phenomenon centrally with osseous spurring. No subarticular zone narrowing. Foraminal narrowing due to osseous spurring could affect either L3 nerve root, doubtful significance. L4-L5: Adequate posterior decompression. Solid posterior and interbody arthrodesis. Hardware intact. No impingement. L5-S1: Remote interbody fusion. S1 pedicle screws removed. No impingement. 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 Rightward translation L2 on L3 has worsened from prior CT. Standing flexion extension views do not demonstrate significant dynamic instability, at L1-2 or L2-3 although the stenosis at  L2-3 may be worse with patient standing. CT LUMBAR MYELOGRAM IMPRESSION: Solid L3-S1 posterior and/or interbody arthrodesis. Severe adjacent segment disease L2-3 consists of disc space narrowing, posterior element hypertrophy, central extrusion with cephalad migrated free fragment,  cephalad migration RIGHT L3 screw, and RIGHT-sided synovial cyst. RIGHT greater than LEFT L2 and L3 nerve root impingement. Disc space narrowing at L1-2 with osseous spurring. No subarticular zone narrowing or stenosis but BILATERAL L1 foraminal narrowing could be symptomatic. Electronically Signed   By: Staci Righter M.D.   On: 06/19/2017 11:30   Dg C-arm 1-60 Min  Result Date: 07/13/2017 CLINICAL DATA:  INTRAOPERATIVE FLUOROSCOPY: Laminectomy Lumbar Two-Three, Posterior Lateral Fusion - Lumbar One-Two-Lumbar Two-Three, extension of pedicle screw fixation -Lumbar One-Lumbar Five RSTO: [facility] FLUORO TIME: 33 SECS EXAM: DG C-ARM 61-120 MIN; LUMBAR SPINE - 2-3 VIEW COMPARISON:  CT 06/19/2017 FINDINGS: Posterior retractors are in place. Transpedicular screws are placed at L1 and L2 and subsequently the lumbar levels L1-L4 are fused. The transpedicular screws and hardware at L5 is either removed or not imaged. IMPRESSION: Posterior fusion L1-L4. Electronically Signed   By: Nolon Nations M.D.   On: 07/13/2017 20:46   Dg Myelography Lumbar Inj Lumbosacral  Result Date: 06/19/2017 CLINICAL DATA:  Low back pain.  Suspected adjacent segment disease. EXAM: LUMBAR MYELOGRAM FLUOROSCOPY TIME:  25 seconds corresponding to a Dose Area Product of 313.44 Gy*m2 PROCEDURE: After thorough discussion of risks and benefits of the procedure including bleeding, infection, injury to nerves, blood vessels, adjacent structures as well as headache and CSF leak, written and oral informed consent was obtained. Consent was obtained by Dr. Rolla Flatten. Time out form was completed. Patient was positioned prone on the fluoroscopy table. Local anesthesia was provided with 1% lidocaine without epinephrine after prepped and draped in the usual sterile fashion. Puncture was performed at L5 using a 3 1/2 inch 20 gauge needle via RIGHT paramedian approach. Using a single pass through the dura, the needle was placed within the thecal sac, with  return of clear CSF. 15 mL of Isovue M-200 was injected into the thecal sac, with normal opacification of the nerve roots and cauda equina consistent with free flow within the subarachnoid space. I personally performed the lumbar puncture and administered the intrathecal contrast. I also personally supervised acquisition of the myelogram images. TECHNIQUE: Contiguous axial images were obtained through the Lumbar spine after the intrathecal infusion of infusion. Coronal and sagittal reconstructions were obtained of the axial image sets. COMPARISON:  Most recent MR 03/05/2015.  Most recent CT, 10/04/2016. FINDINGS: LUMBAR MYELOGRAM FINDINGS: Good opacification lumbar subarachnoid space. Previous L4-5 posterolateral and interbody fusion. Previous posterior fusion L3-4, using pedicle screws. 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 BILATERAL RIGHT greater than LEFT L3 nerve root impingement. Marked endplate sclerotic changes at this level, with moderate loss of interspace height. Rightward translation L2 on L3. Mild stenosis L1-L2. Moderate narrowing L1-L2, without associated endplate reactive changes. Straightening of the normal lumbar lordosis, even slight reversal. Trace retrolisthesis L2-3. Trace retrolisthesis L1-2. Large ventral defect consistent with extruded disc material extends cephalad from L2-3 interspace contributing to stenosis. Prominent ligamentum flavum infolding L2-L3. RIGHT greater than LEFT L3 nerve root impingement. Trace retrolisthesis L3-4. With patient standing, no significant dynamic instability is observed through flexion extension. The stenosis at L2-3 may be worse with patient standing, particularly with regard to RIGHT-sided impingement. CT LUMBAR MYELOGRAM FINDINGS: Segmentation: Normal. Alignment: 2 mm retrolisthesis L1-2. Significant rightward translation L2 on L3, as much as 9  mm. Vertebrae: Severe sclerotic and subchondral cystic change, L2 and L3 endplates above and  below the L2-L3 interspace.No worrisome osseous lesion. Solid arthrodesis L4-5 and L5-S1. Despite vacuum phenomenon at L3-4, there appears to be solid posterior arthrodesis across the L3-4 facets. LEFT L5 screw extends into the interspace at L5-S1, but despite this the fusion is solid. Conus medullaris: Normal in size and location. Paraspinal tissues: Unremarkable. Disc levels: L1-L2: Disc space narrowing. Vacuum phenomenon. 2 mm retrolisthesis. Annular bulge. Facet arthropathy. No stenosis or subarticular zone narrowing, but BILATERAL foraminal narrowing could affect either L1 nerve root. L2-L3: Severe adjacent segment disease. Rightward translation L2 on L3. RIGHT L3 screw enters the interspace. LEFT L3 screw is lateral to the L3 vertebral body. Central protrusion with cephalad migrated free fragment. Facet arthropathy with possible RIGHT-sided synovial cyst, 6 mm in size. Facet joint effusions. RIGHT greater than LEFT L3 nerve root impingement. BILATERAL foraminal narrowing could affect either L2 nerve root, also on the LEFT. L3-L4: Solid posterior arthrodesis. Vacuum phenomenon centrally with osseous spurring. No subarticular zone narrowing. Foraminal narrowing due to osseous spurring could affect either L3 nerve root, doubtful significance. L4-L5: Adequate posterior decompression. Solid posterior and interbody arthrodesis. Hardware intact. No impingement. L5-S1: Remote interbody fusion. S1 pedicle screws removed. No impingement. 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 Rightward translation L2 on L3 has worsened from prior CT. Standing flexion extension views do not demonstrate significant dynamic instability, at L1-2 or L2-3 although the stenosis at L2-3 may be worse with patient standing. CT LUMBAR MYELOGRAM IMPRESSION: Solid L3-S1 posterior and/or interbody arthrodesis. Severe adjacent segment disease L2-3 consists of disc space narrowing,  posterior element hypertrophy, central extrusion with cephalad migrated free fragment, cephalad migration RIGHT L3 screw, and RIGHT-sided synovial cyst. RIGHT greater than LEFT L2 and L3 nerve root impingement. Disc space narrowing at L1-2 with osseous spurring. No subarticular zone narrowing or stenosis but BILATERAL L1 foraminal narrowing could be symptomatic. Electronically Signed   By: Staci Righter M.D.   On: 06/19/2017 11:30    Antibiotics:  Anti-infectives (From admission, onward)   Start     Dose/Rate Route Frequency Ordered Stop   07/14/17 0000  ceFAZolin (ANCEF) IVPB 2g/100 mL premix     2 g 200 mL/hr over 30 Minutes Intravenous Every 8 hours 07/13/17 2005 07/14/17 1559   07/13/17 1818  vancomycin (VANCOCIN) powder  Status:  Discontinued       As needed 07/13/17 1819 07/13/17 1840   07/13/17 1752  bacitracin 50,000 Units in sodium chloride irrigation 0.9 % 500 mL irrigation  Status:  Discontinued       As needed 07/13/17 1753 07/13/17 1840   07/13/17 1539  ceFAZolin (ANCEF) 2-4 GM/100ML-% IVPB    Comments:  Hogue, Samantha   : cabinet override      07/13/17 1539 07/14/17 0344      Discharge Exam: Blood pressure 111/73, pulse 80, temperature 98.2 F (36.8 C), temperature source Oral, resp. rate 16, height 5\' 8"  (1.727 m), weight 88 kg (194 lb), SpO2 98 %. Neurologic: Grossly normal Dressing dry  Discharge Medications:   Allergies as of 07/14/2017   No Known Allergies     Medication List    TAKE these medications   amLODipine 10 MG tablet Commonly known as:  NORVASC Take 10 mg by mouth every morning.   aspirin 325 MG tablet Take 325 mg by mouth every other day.   CENTRUM SILVER PO Take 1 tablet by  mouth daily.   cyclobenzaprine 10 MG tablet Commonly known as:  FLEXERIL Take 1 tablet (10 mg total) by mouth 3 (three) times daily as needed for muscle spasms.   DEXILANT 60 MG capsule Generic drug:  dexlansoprazole Take 60 mg by mouth every morning.    ezetimibe-simvastatin 10-40 MG tablet Commonly known as:  VYTORIN Take 1 tablet by mouth every morning.   furosemide 40 MG tablet Commonly known as:  LASIX Take 40 mg by mouth every morning.   GLUCOSAMINE CHOND DOUBLE STR PO Take 1 tablet by mouth daily.   guanFACINE 1 MG Tb24 ER tablet Commonly known as:  INTUNIV Take 1 mg by mouth at bedtime.   IRON PO Take 1 tablet by mouth daily.   losartan 100 MG tablet Commonly known as:  COZAAR Take 100 mg by mouth every morning.   montelukast 10 MG tablet Commonly known as:  SINGULAIR Take 10 mg by mouth at bedtime.   oxyCODONE-acetaminophen 7.5-325 MG tablet Commonly known as:  PERCOCET Take 1 tablet by mouth every 4 (four) hours as needed for severe pain.   sildenafil 100 MG tablet Commonly known as:  VIAGRA Take 100 mg by mouth daily as needed for erectile dysfunction.   ZORVOLEX 35 MG Caps Generic drug:  Diclofenac Take 35 mg by mouth 3 (three) times daily.            Durable Medical Equipment  (From admission, onward)        Start     Ordered   07/13/17 2006  DME Walker rolling  Once    Question:  Patient needs a walker to treat with the following condition  Answer:  S/P lumbar fusion   07/13/17 2005   07/13/17 2006  DME 3 n 1  Once     07/13/17 2005      Disposition: home   Final Dx: lumbar fusion  Discharge Instructions     Remove dressing in 72 hours   Complete by:  As directed    Call MD for:  difficulty breathing, headache or visual disturbances   Complete by:  As directed    Call MD for:  persistant nausea and vomiting   Complete by:  As directed    Call MD for:  redness, tenderness, or signs of infection (pain, swelling, redness, odor or Mayweather/yellow discharge around incision site)   Complete by:  As directed    Call MD for:  severe uncontrolled pain   Complete by:  As directed    Call MD for:  temperature >100.4   Complete by:  As directed    Diet - low sodium heart healthy   Complete  by:  As directed    Increase activity slowly   Complete by:  As directed       Follow-up Information    Eustace Moore, MD. Schedule an appointment as soon as possible for a visit in 2 week(s).   Specialty:  Neurosurgery Contact information: [address] [place] [address] [place] [address] [phone]            Signed: Eustace Moore 07/14/2017, 9:49 AM

## 2017-07-14 NOTE — Care Management Note (Signed)
Case Management Note  Patient Details  Name: Keith Webb MRN: [id number] Date of Birth: 1950/11/10  Subjective/Objective:                 Spoke to patient and wife over the phone who decline DME and HH.    Action/Plan:   Expected Discharge Date:  07/14/17               Expected Discharge Plan:  Home/Self Care  In-House Referral:     Discharge planning Services     Post Acute Care Choice:    Choice offered to:     DME Arranged:    DME Agency:     HH Arranged:    HH Agency:     Status of Service:  Completed, signed off  If discussed at [employer] of Stay Meetings, dates discussed:    Additional Comments:  Carles Collet, RN 07/14/2017, 10:06 AM

## 2017-07-14 NOTE — Progress Notes (Signed)
Patient ready for discharge to home; discharge instructions given and reviewed; Rx's given. Wife present To accompany patient home; Patient discharged out via wheelchair.

## 2017-07-16 ENCOUNTER — Encounter (HOSPITAL_COMMUNITY): Payer: Self-pay | Admitting: Neurological Surgery

## 2017-07-16 MED FILL — Thrombin For Soln 5000 Unit: CUTANEOUS | Qty: 5000 | Status: AC

## 2017-08-21 DIAGNOSIS — C61 Malignant neoplasm of prostate: Secondary | ICD-10-CM | POA: Diagnosis not present

## 2017-08-28 DIAGNOSIS — C61 Malignant neoplasm of prostate: Secondary | ICD-10-CM | POA: Diagnosis not present

## 2017-08-28 DIAGNOSIS — N5201 Erectile dysfunction due to arterial insufficiency: Secondary | ICD-10-CM | POA: Diagnosis not present

## 2017-08-31 DIAGNOSIS — M48062 Spinal stenosis, lumbar region with neurogenic claudication: Secondary | ICD-10-CM | POA: Diagnosis not present

## 2017-10-16 DIAGNOSIS — E785 Hyperlipidemia, unspecified: Secondary | ICD-10-CM | POA: Diagnosis not present

## 2017-10-16 DIAGNOSIS — I10 Essential (primary) hypertension: Secondary | ICD-10-CM | POA: Diagnosis not present

## 2017-10-16 DIAGNOSIS — N189 Chronic kidney disease, unspecified: Secondary | ICD-10-CM | POA: Diagnosis not present

## 2017-10-16 DIAGNOSIS — R7309 Other abnormal glucose: Secondary | ICD-10-CM | POA: Diagnosis not present

## 2017-10-22 DIAGNOSIS — I10 Essential (primary) hypertension: Secondary | ICD-10-CM | POA: Diagnosis not present

## 2017-10-22 DIAGNOSIS — E785 Hyperlipidemia, unspecified: Secondary | ICD-10-CM | POA: Diagnosis not present

## 2017-10-22 DIAGNOSIS — I351 Nonrheumatic aortic (valve) insufficiency: Secondary | ICD-10-CM | POA: Diagnosis not present

## 2017-11-06 DIAGNOSIS — I10 Essential (primary) hypertension: Secondary | ICD-10-CM | POA: Diagnosis not present

## 2017-11-06 DIAGNOSIS — M545 Low back pain: Secondary | ICD-10-CM | POA: Diagnosis not present

## 2017-11-06 DIAGNOSIS — Z6829 Body mass index (BMI) 29.0-29.9, adult: Secondary | ICD-10-CM | POA: Diagnosis not present

## 2017-11-14 IMAGING — CT CT L SPINE W/O CM
3 of 12 series · 10 of 33 positions shown, 11 images · non-contrast
Comparison: Plain films 09/25/2016.

CLINICAL DATA: Low back pain.  Previous lumbar fusion 09/10/2015.

EXAM:
CT LUMBAR SPINE WITHOUT CONTRAST
TECHNIQUE: Multidetector CT imaging of the lumbar spine was performed without
intravenous contrast administration. Multiplanar CT image
reconstructions were also generated.

[Series 5: l spine detail · axial · 0.29mm/px · z∈[-10,+60]mm · 2 of 85 slices shown, 3 images]
[im 29/85  soft-tissue]
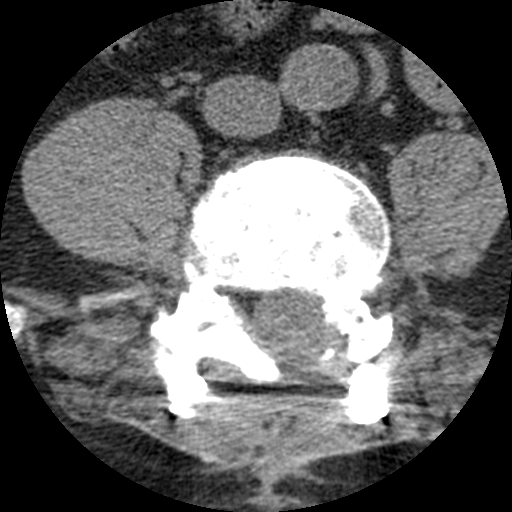
[im 29/85  bone]
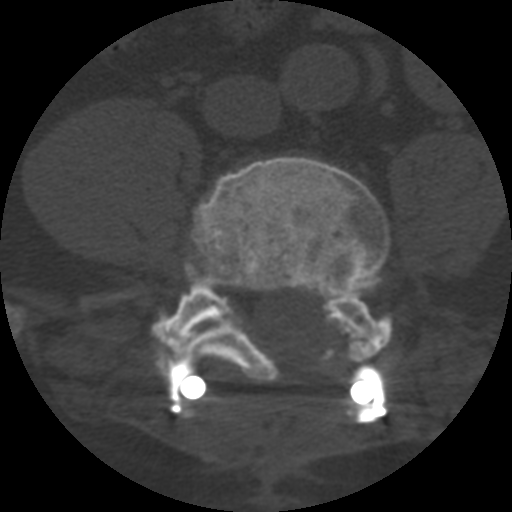
[im 57/85  bone]
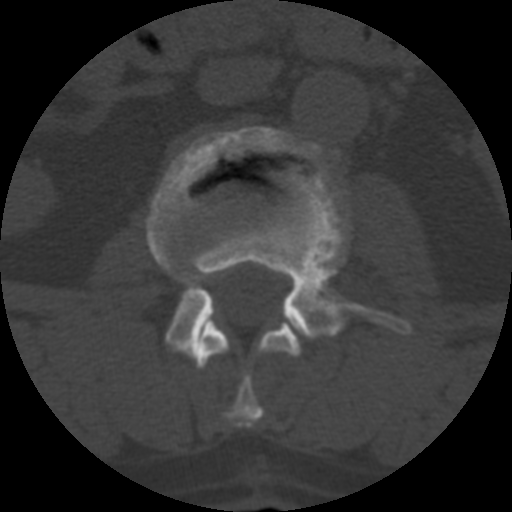

[Series 200: cor · coronal · 0.43mm/px · 3 of 62 slices shown]
[im 13/62  bone]
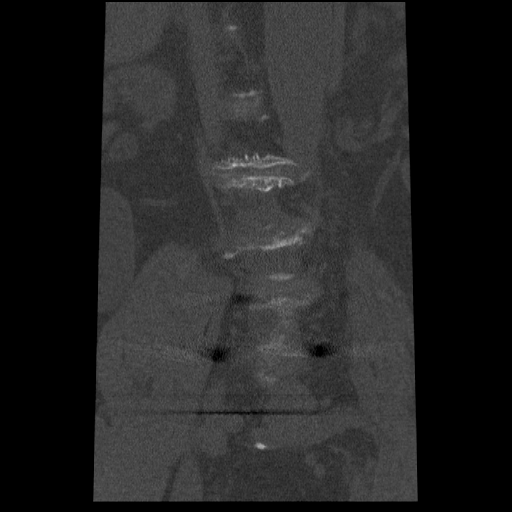
[im 25/62  bone]
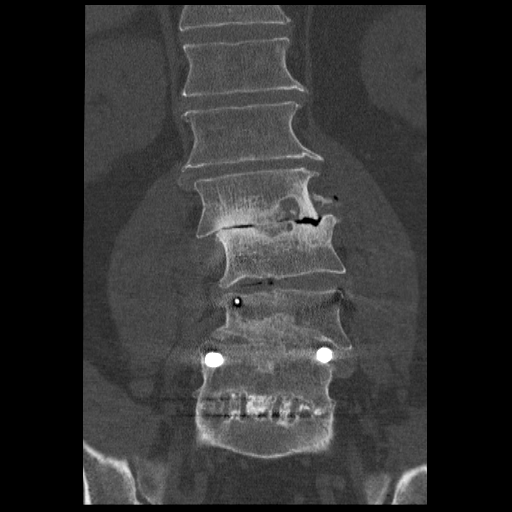
[im 37/62  bone]
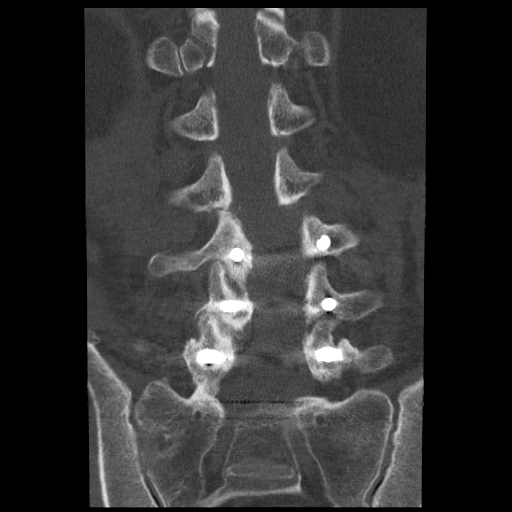

[Series 201: sag · sagittal · 0.43mm/px · 5 of 71 slices shown]
[im 12/71  bone]
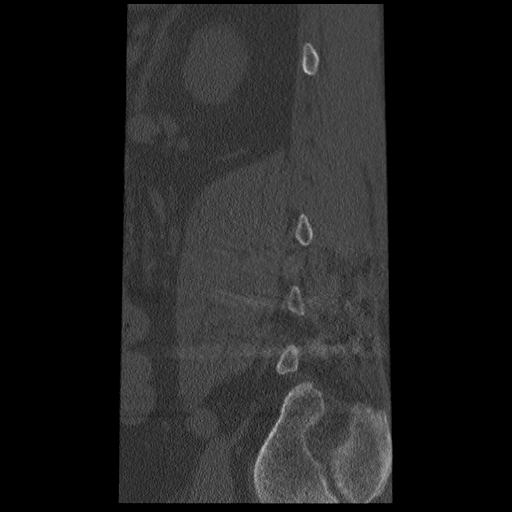
[im 24/71  bone]
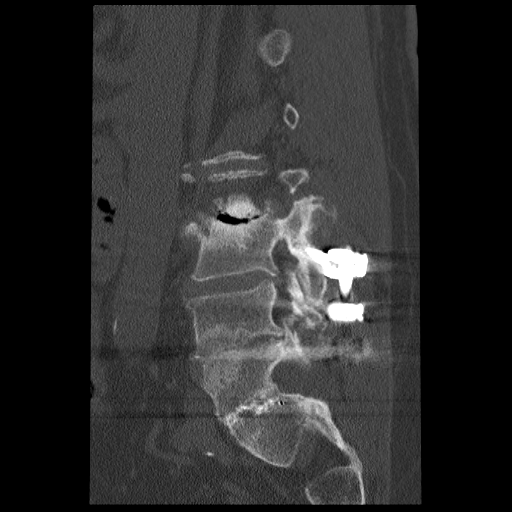
[im 36/71  bone]
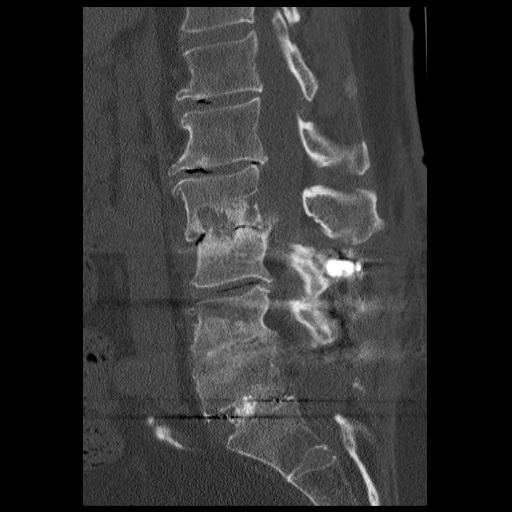
[im 47/71  bone]
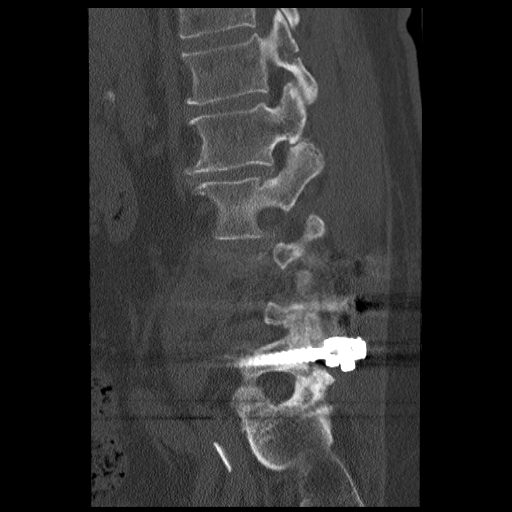
[im 59/71  bone]
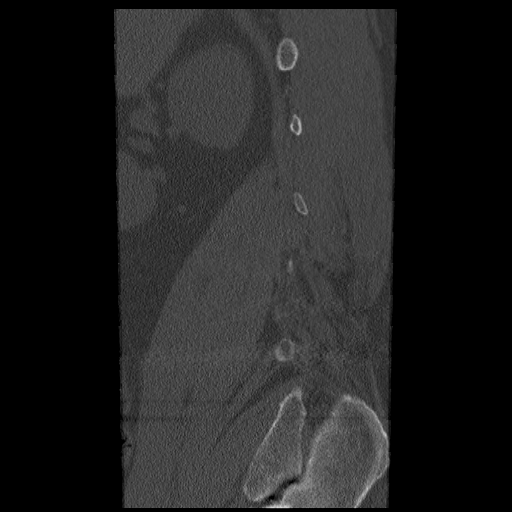

[10 of 33 positions shown; findings below may reference images not displayed]

FINDINGS: Segmentation: Standard

Alignment: Straightening of the normal lumbar lordosis. 2 mm
retrolisthesis L1-2. Significant rightward translation L2 on L3 of 7
mm.

Vertebrae: Previous L5-S1 PLIF several years old and appears to be a
solid arthrodesis. More recently, 09/10/2015, L3 through L5
posterior pedicle screws were placed. The LEFT L5 screw extends into
the interspace, but despite this there appears to be solid interbody
arthrodesis on the wrong as well as solid posterior arthrodesis.

No solid interbody arthrodesis at L3-4. Vacuum phenomenon without
interbody bridging. Some osseous bridging is seen across the facets
posteriorly.

Severe subchondral cystic formation, loss of interspace height, and
vacuum phenomenon at L2-3. The RIGHT L3 pedicle screw approaches the
endplate. The LEFT L3 pedicle screw is fairly lateral in position.

Paraspinal and other soft tissues: Unremarkable.

Disc levels:

L1-L2: Disc space narrowing. Vacuum phenomenon. 2 mm retrolisthesis.
Osseous spurring. No stenosis or subarticular zone narrowing, but
BILATERAL foraminal narrowing could affect either L1 nerve root.

L2-L3: Complete loss of interspace height. Partially calcified disc
extrusion central and to the LEFT extending into the canal.
Posterior element hypertrophy with widening of the LEFT facet joint.
Severe stenosis. Rightward translation L2 on L3 of 7 mm. LEFT
greater than RIGHT L2 and L3 nerve root impingement.

L3-L4: Pseudarthrosis. Osseous spurring. No subarticular zone
narrowing. LEFT foraminal narrowing could irritate the LEFT L3 nerve
root.

L4-L5: Solid arthrodesis. Severe foraminal narrowing on the LEFT but
no central canal stenosis. LEFT L4 nerve root impingement is
possible.

L5-S1:  Solid arthrodesis.  No impingement.
IMPRESSION: Status post L3 through S1 fusion. Solid arthrodesis at L4-5 and
L5-S1. Pseudarthrosis at L3-4.

Severe adjacent segment disease at L2-3 with complete loss of
interspace height, rightward translation L2 on L3, posterior element
hypertrophy, and large partially calcified disc extrusion central
and to the LEFT. Severe stenosis. LEFT greater than RIGHT L2 and L3
nerve root impingement.

Disc space narrowing at L1-2 with vacuum phenomenon and 2 mm
retrolisthesis. BILATERAL foraminal narrowing could affect either L1
nerve root.

## 2017-11-27 DIAGNOSIS — C61 Malignant neoplasm of prostate: Secondary | ICD-10-CM | POA: Diagnosis not present

## 2018-02-05 DIAGNOSIS — M545 Low back pain: Secondary | ICD-10-CM | POA: Diagnosis not present

## 2018-02-05 DIAGNOSIS — Z6829 Body mass index (BMI) 29.0-29.9, adult: Secondary | ICD-10-CM | POA: Diagnosis not present

## 2018-02-05 DIAGNOSIS — I10 Essential (primary) hypertension: Secondary | ICD-10-CM | POA: Diagnosis not present

## 2018-02-12 DIAGNOSIS — C61 Malignant neoplasm of prostate: Secondary | ICD-10-CM | POA: Diagnosis not present

## 2018-02-15 DIAGNOSIS — R310 Gross hematuria: Secondary | ICD-10-CM | POA: Diagnosis not present

## 2018-02-15 DIAGNOSIS — C61 Malignant neoplasm of prostate: Secondary | ICD-10-CM | POA: Diagnosis not present

## 2018-02-21 DIAGNOSIS — M170 Bilateral primary osteoarthritis of knee: Secondary | ICD-10-CM | POA: Diagnosis not present

## 2018-02-21 DIAGNOSIS — R310 Gross hematuria: Secondary | ICD-10-CM | POA: Diagnosis not present

## 2018-02-21 DIAGNOSIS — K5730 Diverticulosis of large intestine without perforation or abscess without bleeding: Secondary | ICD-10-CM | POA: Diagnosis not present

## 2018-02-21 DIAGNOSIS — M25552 Pain in left hip: Secondary | ICD-10-CM | POA: Diagnosis not present

## 2018-03-11 DIAGNOSIS — N281 Cyst of kidney, acquired: Secondary | ICD-10-CM | POA: Diagnosis not present

## 2018-03-11 DIAGNOSIS — R3121 Asymptomatic microscopic hematuria: Secondary | ICD-10-CM | POA: Diagnosis not present

## 2018-03-11 DIAGNOSIS — Z8546 Personal history of malignant neoplasm of prostate: Secondary | ICD-10-CM | POA: Diagnosis not present

## 2018-04-09 DIAGNOSIS — M545 Low back pain: Secondary | ICD-10-CM | POA: Diagnosis not present

## 2018-04-09 DIAGNOSIS — M961 Postlaminectomy syndrome, not elsewhere classified: Secondary | ICD-10-CM | POA: Diagnosis not present

## 2018-07-21 ENCOUNTER — Other Ambulatory Visit: Payer: Self-pay | Admitting: Cardiology

## 2018-08-06 DIAGNOSIS — M545 Low back pain: Secondary | ICD-10-CM | POA: Diagnosis not present

## 2018-08-12 DIAGNOSIS — M2560 Stiffness of unspecified joint, not elsewhere classified: Secondary | ICD-10-CM | POA: Diagnosis not present

## 2018-08-12 DIAGNOSIS — M5416 Radiculopathy, lumbar region: Secondary | ICD-10-CM | POA: Diagnosis not present

## 2018-08-12 DIAGNOSIS — M6281 Muscle weakness (generalized): Secondary | ICD-10-CM | POA: Diagnosis not present

## 2018-08-12 DIAGNOSIS — M545 Low back pain: Secondary | ICD-10-CM | POA: Diagnosis not present

## 2018-08-14 ENCOUNTER — Other Ambulatory Visit: Payer: Self-pay | Admitting: Cardiology

## 2018-08-14 DIAGNOSIS — M545 Low back pain: Secondary | ICD-10-CM | POA: Diagnosis not present

## 2018-08-14 DIAGNOSIS — M2560 Stiffness of unspecified joint, not elsewhere classified: Secondary | ICD-10-CM | POA: Diagnosis not present

## 2018-08-14 DIAGNOSIS — I351 Nonrheumatic aortic (valve) insufficiency: Secondary | ICD-10-CM

## 2018-08-14 DIAGNOSIS — M5416 Radiculopathy, lumbar region: Secondary | ICD-10-CM | POA: Diagnosis not present

## 2018-08-14 DIAGNOSIS — M6281 Muscle weakness (generalized): Secondary | ICD-10-CM | POA: Diagnosis not present

## 2018-08-15 DIAGNOSIS — M5416 Radiculopathy, lumbar region: Secondary | ICD-10-CM | POA: Diagnosis not present

## 2018-08-15 DIAGNOSIS — M545 Low back pain: Secondary | ICD-10-CM | POA: Diagnosis not present

## 2018-08-15 DIAGNOSIS — M6281 Muscle weakness (generalized): Secondary | ICD-10-CM | POA: Diagnosis not present

## 2018-08-15 DIAGNOSIS — M2560 Stiffness of unspecified joint, not elsewhere classified: Secondary | ICD-10-CM | POA: Diagnosis not present

## 2018-08-19 DIAGNOSIS — M5416 Radiculopathy, lumbar region: Secondary | ICD-10-CM | POA: Diagnosis not present

## 2018-08-19 DIAGNOSIS — M545 Low back pain: Secondary | ICD-10-CM | POA: Diagnosis not present

## 2018-08-19 DIAGNOSIS — M2560 Stiffness of unspecified joint, not elsewhere classified: Secondary | ICD-10-CM | POA: Diagnosis not present

## 2018-08-19 DIAGNOSIS — M6281 Muscle weakness (generalized): Secondary | ICD-10-CM | POA: Diagnosis not present

## 2018-08-21 DIAGNOSIS — M545 Low back pain: Secondary | ICD-10-CM | POA: Diagnosis not present

## 2018-08-21 DIAGNOSIS — M6281 Muscle weakness (generalized): Secondary | ICD-10-CM | POA: Diagnosis not present

## 2018-08-21 DIAGNOSIS — M5416 Radiculopathy, lumbar region: Secondary | ICD-10-CM | POA: Diagnosis not present

## 2018-08-21 DIAGNOSIS — M2560 Stiffness of unspecified joint, not elsewhere classified: Secondary | ICD-10-CM | POA: Diagnosis not present

## 2018-08-22 DIAGNOSIS — M2560 Stiffness of unspecified joint, not elsewhere classified: Secondary | ICD-10-CM | POA: Diagnosis not present

## 2018-08-22 DIAGNOSIS — M6281 Muscle weakness (generalized): Secondary | ICD-10-CM | POA: Diagnosis not present

## 2018-08-22 DIAGNOSIS — M545 Low back pain: Secondary | ICD-10-CM | POA: Diagnosis not present

## 2018-08-22 DIAGNOSIS — M5416 Radiculopathy, lumbar region: Secondary | ICD-10-CM | POA: Diagnosis not present

## 2018-08-26 DIAGNOSIS — M2560 Stiffness of unspecified joint, not elsewhere classified: Secondary | ICD-10-CM | POA: Diagnosis not present

## 2018-08-26 DIAGNOSIS — M6281 Muscle weakness (generalized): Secondary | ICD-10-CM | POA: Diagnosis not present

## 2018-08-26 DIAGNOSIS — M5416 Radiculopathy, lumbar region: Secondary | ICD-10-CM | POA: Diagnosis not present

## 2018-08-26 DIAGNOSIS — M545 Low back pain: Secondary | ICD-10-CM | POA: Diagnosis not present

## 2018-09-02 DIAGNOSIS — Z8546 Personal history of malignant neoplasm of prostate: Secondary | ICD-10-CM | POA: Diagnosis not present

## 2018-09-05 DIAGNOSIS — M2560 Stiffness of unspecified joint, not elsewhere classified: Secondary | ICD-10-CM | POA: Diagnosis not present

## 2018-09-05 DIAGNOSIS — M6281 Muscle weakness (generalized): Secondary | ICD-10-CM | POA: Diagnosis not present

## 2018-09-05 DIAGNOSIS — M545 Low back pain: Secondary | ICD-10-CM | POA: Diagnosis not present

## 2018-09-05 DIAGNOSIS — M5416 Radiculopathy, lumbar region: Secondary | ICD-10-CM | POA: Diagnosis not present

## 2018-09-09 DIAGNOSIS — N5201 Erectile dysfunction due to arterial insufficiency: Secondary | ICD-10-CM | POA: Diagnosis not present

## 2018-09-09 DIAGNOSIS — Z8546 Personal history of malignant neoplasm of prostate: Secondary | ICD-10-CM | POA: Diagnosis not present

## 2018-10-15 ENCOUNTER — Other Ambulatory Visit: Payer: BLUE CROSS/BLUE SHIELD

## 2018-10-17 ENCOUNTER — Ambulatory Visit (INDEPENDENT_AMBULATORY_CARE_PROVIDER_SITE_OTHER): Payer: BLUE CROSS/BLUE SHIELD

## 2018-10-17 ENCOUNTER — Other Ambulatory Visit: Payer: Self-pay

## 2018-10-17 DIAGNOSIS — I351 Nonrheumatic aortic (valve) insufficiency: Secondary | ICD-10-CM | POA: Diagnosis not present

## 2018-10-19 DIAGNOSIS — I351 Nonrheumatic aortic (valve) insufficiency: Secondary | ICD-10-CM | POA: Insufficient documentation

## 2018-10-19 DIAGNOSIS — E7800 Pure hypercholesterolemia, unspecified: Secondary | ICD-10-CM | POA: Insufficient documentation

## 2018-10-19 DIAGNOSIS — I10 Essential (primary) hypertension: Secondary | ICD-10-CM | POA: Insufficient documentation

## 2018-10-19 NOTE — Progress Notes (Signed)
Subjective:  Primary Physician:  Lucianne Lei, MD  Patient ID: Keith Webb, male    DOB: Jun 01, 1951, 68 y.o.   MRN: [id number]  This visit type was conducted due to national recommendations for restrictions regarding the COVID-19 Pandemic (e.g. social distancing).  This format is felt to be most appropriate for this patient at this time.  All issues noted in this document were discussed and addressed.  No physical exam was performed (except for noted visual exam findings with Telehealth visits - very limited).  The patient has consented to conduct a Telehealth visit and understands insurance will be billed.   I connected with patient, on 10/21/18  by a  telemedicine application and verified that I am speaking with the correct person using two identifiers.     I discussed the limitations of evaluation and management by telemedicine and the availability of in person appointments. The patient expressed understanding and agreed to proceed.   I have discussed with patient regarding the safety during COVID Pandemic and steps and precautions including social distancing with the patient.    Chief Complaint  Patient presents with  . Aortic Insuffiency  . Follow-up    HPI: Keith Webb  is a 68 y.o. male   Patient has mild shortness of breath on walking up to 1 mile. He has not been walking regularly like he did in the past. There is no dyspnea at rest and no orthopnea or PND.   No complaints of chest pain, tightness or pressure. No palpitation, dizziness, near syncope or syncope. He has mild swelling on the legs off and on, improved with diuretic therapy. No history of leg claudication.  Patient has hypertension, prediabetes, hypercholesterolemia. He is former smoker, used to smoke cigars, but quit smoking in 2005.  Patient has CKD. There is no history of thyroid problems. No history of TIA or CVA. Patient had prostate carcinoma and had radical prostatectomy in the past. His PSA went up  again and had radiation therapy for 6 weeks in 2015. Since then, he has remained stable.  Past Medical History:  Diagnosis Date  . Arthritis   . Environmental and seasonal allergies   . GERD (gastroesophageal reflux disease)   . Heart murmur   . History of syncope    2006  orthostatic hypotension w/ dehydration  . Hypercholesterolemia   . Hypertension   . OSA (obstructive sleep apnea)    per pt used cpap for awhile but stopped because  didn't think he needed any more  . Recurrent prostate cancer [facility]) urologist-  dr budzyni/  oncologist-- dr Valere Dross   first dx 1998 (Gleason 7,  PSA 18.5) s/p  radical prostatectomy---  recurrent 10/ 2013  w/ salvage external radiation 06-24-2014 to 08-10-2014/  per pt (07-21-2016)  last PSA <5  . S/P radiation therapy 06/24/2014 through 08/10/2014   External beam of Prostate bed 6600 cGy in 33 sessions   . Wears glasses     Past Surgical History:  Procedure Laterality Date  . LAMINECTOMY WITH POSTERIOR LATERAL ARTHRODESIS LEVEL 2 N/A 07/13/2017   Procedure: Laminectomy Lumbar Two-Three, Posterior Lateral Fusion - Lumbar One-Two-Lumbar Two-Three, extension of pedicle screw fixation  -Lumbar One-Lumbar Five;  Surgeon: Eustace Moore, MD;  Location: [place];  Service: Neurosurgery;  Laterality: N/A;  . POSTERIOR LAMINECTOMY / DECOMPRESSION LUMBAR SPINE  12/09/2009   W/  DECOMPRESSION AND FUSION L5--S1/  REMOVAL EPIDURAL SYNOVIAL CYST (per path no neoplasm present)  . POSTERIOR LAMINECTOMY / DECOMPRESSION  LUMBAR SPINE  09/10/2015   decompressive laminectomy L4-5 left/  fixation and arthrodesis L3-5 /  removal hardware L5-S1  . PROSTATECTOMY  04/01/1997   radical w/ bilateral pelvic lymph node dissection  . RECTAL EXAM UNDER ANESTHESIA N/A 07/27/2016   Procedure: RECTAL EXAM UNDER ANESTHESIA;  Surgeon: Michael Boston, MD;  Location: [place];  Service: General;  Laterality:  N/A;  Hemorrhoidal ligation, suture pexy  . RIGHT INDEX FINGER SYNOVECTOMY/ EXCISION GIANT CELL TUMOR  02/19/2001  . TOTAL HIP ARTHROPLASTY Left 12/08/2002  . TRANSTHORACIC ECHOCARDIOGRAM  01/03/2005   ef 55-65%/  trivial AR and MR    Social History   Socioeconomic History  . Marital status: Married    Spouse name: Not on file  . Number of children: 1  . Years of education: Not on file  . Highest education level: Not on file  Occupational History  . Not on file  Social Needs  . Financial resource strain: Not on file  . Food insecurity:    Worry: Not on file    Inability: Not on file  . Transportation needs:    Medical: Not on file    Non-medical: Not on file  Tobacco Use  . Smoking status: Former Smoker    Years: 30.00    Types: Cigars    Last attempt to quit: 05/22/2004    Years since quitting: 14.4  . Smokeless tobacco: Never Used  Substance and Sexual Activity  . Alcohol use: No  . Drug use: No    Types: Marijuana, Cocaine    Comment: remote hx per pt quit along time ago  . Sexual activity: Not on file  Lifestyle  . Physical activity:    Days per week: Not on file    Minutes per session: Not on file  . Stress: Not on file  Relationships  . Social connections:    Talks on phone: Not on file    Gets together: Not on file    Attends religious service: Not on file    Active member of club or organization: Not on file    Attends meetings of clubs or organizations: Not on file    Relationship status: Not on file  . Intimate partner violence:    Fear of current or ex partner: Not on file    Emotionally abused: Not on file    Physically abused: Not on file    Forced sexual activity: Not on file  Other Topics Concern  . Not on file  Social History Narrative  . Not on file    Current Outpatient Medications on File Prior to Visit  Medication Sig Dispense Refill  . amLODipine (NORVASC) 10 MG tablet Take 10 mg by mouth every morning.     Marland Kitchen aspirin 325 MG tablet  Take 325 mg by mouth every other day.     Marland Kitchen dexlansoprazole (DEXILANT) 60 MG capsule Take 60 mg by mouth every morning.     . Diclofenac (ZORVOLEX) 35 MG CAPS Take 35 mg by mouth 3 (three) times daily.    Marland Kitchen ezetimibe-simvastatin (VYTORIN) 10-40 MG per tablet Take 1 tablet by mouth every morning.     . furosemide (LASIX) 40 MG tablet TAKE 1 TABLET BY MOUTH DAILY 90 tablet 3  . guanFACINE (INTUNIV) 1 MG TB24 Take 1 mg by mouth at bedtime.     . IRON PO Take 1 tablet by mouth daily.    Marland Kitchen losartan (COZAAR) 100 MG tablet Take 100 mg by mouth  every morning.     . montelukast (SINGULAIR) 10 MG tablet Take 10 mg by mouth at bedtime.     . Multiple Vitamins-Minerals (CENTRUM SILVER PO) Take 1 tablet by mouth daily.    . sildenafil (VIAGRA) 100 MG tablet Take 100 mg by mouth daily as needed for erectile dysfunction.    . cyclobenzaprine (FLEXERIL) 10 MG tablet Take 1 tablet (10 mg total) by mouth 3 (three) times daily as needed for muscle spasms. (Patient not taking: Reported on 10/21/2018) 30 tablet 0  . Misc Natural Products (GLUCOSAMINE CHOND DOUBLE STR PO) Take 1 tablet by mouth daily.    Marland Kitchen oxyCODONE-acetaminophen (PERCOCET) 7.5-325 MG tablet Take 1 tablet by mouth every 4 (four) hours as needed for severe pain. (Patient not taking: Reported on 10/21/2018) 30 tablet 0   No current facility-administered medications on file prior to visit.     Review of Systems  Constitutional: Negative for fever.  HENT: Negative for nosebleeds.   Eyes: Negative for blurred vision.  Respiratory: Positive for shortness of breath (mild, exertional). Negative for cough.   Cardiovascular: Positive for leg swelling (mild, off and on). Negative for chest pain and palpitations.  Gastrointestinal: Negative for abdominal pain, nausea and vomiting.  Genitourinary: Negative for dysuria.  Musculoskeletal: Negative for myalgias.  Skin: Negative for itching and rash.  Neurological: Negative for dizziness, seizures and loss of  consciousness.  Psychiatric/Behavioral: The patient is not nervous/anxious.        Objective:  Height 5\' 8"  (1.727 m), weight 195 lb (88.5 kg). Body mass index is 29.65 kg/m.  Physical Exam: Patient is alert and oriented. He appeared very comfortable while talking to me during the visit. No further detailed exam. was possible as it was a telemedicine visit.  CARDIAC STUDIES:  Echocardiogram 10/17/2018: Left ventricle cavity is normal in size. Mild concentric hypertrophy of the left ventricle. Normal global wall motion. Doppler evidence of grade I (impaired) diastolic dysfunction, normal LAP. Calculated EF 65%. Trileaflet aortic valve with mild aortic valve leaflet calcification. Mild to moderate regurgitation. Inadequate TR jet to estimate pulmonary artery systolic pressure. Normal right atrial pressure.  No significant change compared to previous study dated 10/12/2016.  Treadmill stress test [04/13/2014]: Indications: Shortness of breath. Conclusions: Negative for ischemia. Marked decrease in exercise tolerence. The patient exercised according to the Bruce protocol, Total time recorded 3 Min. 42 sec. achieving a max heart rate of 157 which was 101% of MPHR for age and 7.3 METS of work. Baseline NIBP was 134/80. Peak NIBP was 134/80 MaxSysp was: 178 MaxDiasp was: 80. The baseline ECG showed NSR,Normal ECG. During exercise there was no ST-t changes of ischemia. Symptoms: MPHR achieved. Marked dyspnea. Arrhythmia: None. Continue primary prevention. Increase aerobic activity   Assessment & Recommendations:   1. Nonrheumatic aortic valve insufficiency  2. Essential hypertension  3. Hypercholesterolemia   Laboratory Exam:  CBC Latest Ref Rng & Units 07/10/2017 07/27/2016 09/03/2015  WBC 4.0 - 10.5 K/uL 6.3 - 5.5  Hemoglobin 13.0 - 17.0 g/dL 13.2 12.9(L) 12.6(L)  Hematocrit 39.0 - 52.0 % 39.7 38.0(L) 37.0(L)  Platelets 150 - 400 K/uL 277 - 231   CMP Latest Ref Rng & Units 07/10/2017  07/27/2016 09/03/2015  Glucose 65 - 99 mg/dL 77 94 90  BUN 6 - 20 mg/dL 24(H) 17 20  Creatinine 0.61 - 1.24 mg/dL 1.29(H) 1.20 1.18  Sodium 135 - 145 mmol/L 139 138 138  Potassium 3.5 - 5.1 mmol/L 4.6 4.2 3.9  Chloride 101 - 111  mmol/L 107 103 106  CO2 22 - 32 mmol/L 24 - 22  Calcium 8.9 - 10.3 mg/dL 10.2 - 9.6  Total Protein 6.0 - 8.3 g/dL - - -  Total Bilirubin 0.3 - 1.2 mg/dL - - -  Alkaline Phos 39 - 117 U/L - - -  AST 0 - 37 U/L - - -  ALT 0 - 53 U/L - - -   Lipid Panel  No results found for: CHOL, TRIG, HDL, CHOLHDL, VLDL, LDLCALC, LDLDIRECT   Recommendation:  Valvular heart disease remains stable. Recent echo. Results were explained to him and he was assured.  His blood pressure also remains stable.Patient does not check his BP at home but said he was seen at his PCP's office couple of months ago and BP was told normal.He will continue all the present medications.   Primary prevention was again discussed.He was again given dietary instructions and was encouraged to lose weight. He was advised to continue low-salt, low-cholesterol diet. I have also encouraged him to start walking regularly again.  I will see him in follow-up after 1 year, but call us earlier if there are any cardiac problems. He will continue to have all the blood tests at his PCP's office. Patient said he had blood tests at PCP's office approx. 2 months ago, everything was told to be stable.  Despina Hick, MD, [facility] 10/21/2018, 9:27 AM [facility] [place] Pager: [phone] Office: [phone] If no answer Cell [phone]

## 2018-10-21 ENCOUNTER — Other Ambulatory Visit: Payer: Self-pay

## 2018-10-21 ENCOUNTER — Encounter: Payer: Self-pay | Admitting: Cardiology

## 2018-10-21 ENCOUNTER — Ambulatory Visit: Payer: BLUE CROSS/BLUE SHIELD | Admitting: Cardiology

## 2018-10-21 DIAGNOSIS — I351 Nonrheumatic aortic (valve) insufficiency: Secondary | ICD-10-CM | POA: Diagnosis not present

## 2018-10-21 DIAGNOSIS — E7800 Pure hypercholesterolemia, unspecified: Secondary | ICD-10-CM

## 2018-10-21 DIAGNOSIS — I10 Essential (primary) hypertension: Secondary | ICD-10-CM | POA: Diagnosis not present

## 2018-11-26 ENCOUNTER — Other Ambulatory Visit: Payer: Self-pay | Admitting: Cardiology

## 2019-08-04 ENCOUNTER — Other Ambulatory Visit: Payer: Self-pay | Admitting: Cardiology

## 2019-08-14 ENCOUNTER — Other Ambulatory Visit: Payer: Self-pay | Admitting: Cardiology

## 2019-08-15 ENCOUNTER — Ambulatory Visit: Payer: BC Managed Care – PPO | Attending: Internal Medicine

## 2019-08-15 DIAGNOSIS — Z23 Encounter for immunization: Secondary | ICD-10-CM | POA: Insufficient documentation

## 2019-08-15 NOTE — Progress Notes (Signed)
   Covid-19 Vaccination Clinic  Name:  Keith Webb    MRN: [id number] DOB: Feb 28, 1951  08/15/2019  [name] was observed post Covid-19 immunization for 15 minutes without incident. He was provided with Vaccine Information Sheet and instruction to access the V-Safe system.   [name] was instructed to call 911 with any severe reactions post vaccine: Marland Kitchen Difficulty breathing  . Swelling of face and throat  . A fast heartbeat  . A bad rash all over body  . Dizziness and weakness   Immunizations Administered    Name Date Dose VIS Date Route   Pfizer COVID-19 Vaccine 08/15/2019  3:01 PM 0.3 mL 05/23/2019 Intramuscular   Manufacturer: [place]   Lot: [id number]   [place]: [id number]

## 2019-09-10 ENCOUNTER — Ambulatory Visit: Payer: BC Managed Care – PPO | Attending: Internal Medicine

## 2019-09-10 DIAGNOSIS — Z23 Encounter for immunization: Secondary | ICD-10-CM

## 2019-09-10 NOTE — Progress Notes (Signed)
   Covid-19 Vaccination Clinic  Name:  ROCHELLE WAJDA    MRN: [id number] DOB: 1951/04/24  09/10/2019  Mr. Clarkin was observed post Covid-19 immunization for 15 minutes without incident. He was provided with Vaccine Information Sheet and instruction to access the V-Safe system.   Mr. Morris was instructed to call 911 with any severe reactions post vaccine: Marland Kitchen Difficulty breathing  . Swelling of face and throat  . A fast heartbeat  . A bad rash all over body  . Dizziness and weakness   Immunizations Administered    Name Date Dose VIS Date Route   Pfizer COVID-19 Vaccine 09/10/2019  3:20 PM 0.3 mL 05/23/2019 Intramuscular   Manufacturer: [employer]   Lot: [id number]   [place]: [id number]

## 2019-10-20 ENCOUNTER — Ambulatory Visit: Payer: BLUE CROSS/BLUE SHIELD | Admitting: Cardiology

## 2019-10-30 DIAGNOSIS — M1711 Unilateral primary osteoarthritis, right knee: Secondary | ICD-10-CM | POA: Diagnosis not present

## 2019-10-30 DIAGNOSIS — M5416 Radiculopathy, lumbar region: Secondary | ICD-10-CM | POA: Diagnosis not present

## 2019-10-30 DIAGNOSIS — M1612 Unilateral primary osteoarthritis, left hip: Secondary | ICD-10-CM | POA: Diagnosis not present

## 2019-11-12 ENCOUNTER — Other Ambulatory Visit: Payer: Self-pay | Admitting: Cardiology

## 2019-11-21 ENCOUNTER — Other Ambulatory Visit: Payer: Self-pay | Admitting: Cardiology

## 2019-11-27 ENCOUNTER — Ambulatory Visit: Payer: BC Managed Care – PPO | Admitting: Cardiology

## 2019-11-27 ENCOUNTER — Encounter: Payer: Self-pay | Admitting: Cardiology

## 2019-11-27 ENCOUNTER — Other Ambulatory Visit: Payer: Self-pay

## 2019-11-27 VITALS — BP 144/98 | HR 79 | Ht 68.0 in | Wt 192.0 lb

## 2019-11-27 DIAGNOSIS — I10 Essential (primary) hypertension: Secondary | ICD-10-CM | POA: Diagnosis not present

## 2019-11-27 DIAGNOSIS — I351 Nonrheumatic aortic (valve) insufficiency: Secondary | ICD-10-CM | POA: Diagnosis not present

## 2019-11-27 DIAGNOSIS — M7989 Other specified soft tissue disorders: Secondary | ICD-10-CM

## 2019-11-27 DIAGNOSIS — E7800 Pure hypercholesterolemia, unspecified: Secondary | ICD-10-CM

## 2019-11-27 MED ORDER — FUROSEMIDE 40 MG PO TABS: 40.0000 mg | ORAL_TABLET | Freq: Every day | ORAL | 1 refills | Status: DC

## 2019-11-27 NOTE — Progress Notes (Signed)
Keith Webb Date of Birth: 1950/08/02 MRN: [id number] Primary Care Provider:Bland, Myra Rude, MD Former Cardiology Providers: Dr. Vear Clock Primary Cardiologist: Rex Kras, DO, [facility] (established care 11/27/2019)  Date: 11/27/19  Chief Complaint  Patient presents with  . Nonrheumatic aortic valve insufficiency    follow up .   Marland Kitchen Leg Swelling    HPI  Keith Webb is a 69 y.o.  male who presents to the office with a chief complaint of " follow-up on aortic regurgitation and lower extremity swelling." Patient's past medical history and cardiovascular risk factors include: Hypertension with  CKD, prediabetes, hypercholesterolemia, former cigar smoker, history of prostate carcinoma with radical prostatectomy in the past, advanced age.   Patient was last seen by Dr. Vear Clock via telehealth visit back in May 2020 to follow-up on his underlying aortic regurgitation and lower extremity swelling.  At that time patient was doing relatively well had good functional capacity and at times will get mild shortness of breath with walking.  He was asked to follow-up in 1 year.  Since last office visit patient states that he is doing well overall.  However recently he ran out of his Lasix and has noted lower extremity swelling.  Patient states that the swelling is bilaterally going on for last past week.  He has not noticed any significant weight gain and denies heart failure symptoms of orthopnea or paroxysmal nocturnal dyspnea.  The swelling usually improves when he wakes up in the morning.  Patient's blood pressure today is not at goal.  And he states that his home blood pressures are usually around 140/80.  Recommended that he discuss it further with his primary care physician possibly uptitrating his antihypertensive medications.  Patient has upcoming appointment with his PCP later today and he will discuss it.  Denies prior history of coronary artery disease, myocardial infarction, congestive  heart failure, deep venous thrombosis, pulmonary embolism, stroke, transient ischemic attack.  FUNCTIONAL STATUS: He walks 2-3 miles atleast 3 days off the week.    ALLERGIES: No Known Allergies   MEDICATION LIST PRIOR TO VISIT: Current Outpatient Medications on File Prior to Visit  Medication Sig Dispense Refill  . amLODipine (NORVASC) 10 MG tablet Take 10 mg by mouth every morning.     Marland Kitchen aspirin 325 MG tablet Take 325 mg by mouth every other day.     . esomeprazole (NEXIUM) 40 MG capsule Take 40 mg by mouth daily at 12 noon.    . ezetimibe-simvastatin (VYTORIN) 10-40 MG per tablet Take 1 tablet by mouth every morning.     Marland Kitchen guanFACINE (INTUNIV) 1 MG TB24 Take 1 mg by mouth at bedtime.     . IRON PO Take 1 tablet by mouth daily.    Marland Kitchen losartan (COZAAR) 100 MG tablet TAKE 1 TABLET BY MOUTH DAILY 90 tablet 0  . montelukast (SINGULAIR) 10 MG tablet Take 10 mg by mouth at bedtime.     . Multiple Vitamins-Minerals (CENTRUM SILVER PO) Take 1 tablet by mouth daily.    . nabumetone (RELAFEN) 500 MG tablet Take 500 mg by mouth daily.    . sildenafil (VIAGRA) 100 MG tablet Take 100 mg by mouth daily as needed for erectile dysfunction.     No current facility-administered medications on file prior to visit.    PAST MEDICAL HISTORY: Past Medical History:  Diagnosis Date  . Arthritis   . Environmental and seasonal allergies   . GERD (gastroesophageal reflux disease)   . Heart murmur   .  History of syncope    2006  orthostatic hypotension w/ dehydration  . Hypercholesterolemia   . Hypertension   . OSA (obstructive sleep apnea)    per pt used cpap for awhile but stopped because  didn't think he needed any more  . Recurrent prostate cancer [facility]) urologist-  dr budzyni/  oncologist-- dr Valere Dross   first dx 1998 (Gleason 7,  PSA 18.5) s/p  radical prostatectomy---  recurrent 10/ 2013  w/ salvage external radiation 06-24-2014 to 08-10-2014/  per pt (07-21-2016)  last PSA <5  . S/P radiation  therapy 06/24/2014 through 08/10/2014   External beam of Prostate bed 6600 cGy in 33 sessions   . Wears glasses     PAST SURGICAL HISTORY: Past Surgical History:  Procedure Laterality Date  . LAMINECTOMY WITH POSTERIOR LATERAL ARTHRODESIS LEVEL 2 N/A 07/13/2017   Procedure: Laminectomy Lumbar Two-Three, Posterior Lateral Fusion - Lumbar One-Two-Lumbar Two-Three, extension of pedicle screw fixation  -Lumbar One-Lumbar Five;  Surgeon: Eustace Moore, MD;  Location: [place];  Service: Neurosurgery;  Laterality: N/A;  . POSTERIOR LAMINECTOMY / DECOMPRESSION LUMBAR SPINE  12/09/2009   W/  DECOMPRESSION AND FUSION L5--S1/  REMOVAL EPIDURAL SYNOVIAL CYST (per path no neoplasm present)  . POSTERIOR LAMINECTOMY / DECOMPRESSION LUMBAR SPINE  09/10/2015   decompressive laminectomy L4-5 left/  fixation and arthrodesis L3-5 /  removal hardware L5-S1  . PROSTATECTOMY  04/01/1997   radical w/ bilateral pelvic lymph node dissection  . RECTAL EXAM UNDER ANESTHESIA N/A 07/27/2016   Procedure: RECTAL EXAM UNDER ANESTHESIA;  Surgeon: Michael Boston, MD;  Location: [place];  Service: General;  Laterality: N/A;  Hemorrhoidal ligation, suture pexy  . RIGHT INDEX FINGER SYNOVECTOMY/ EXCISION GIANT CELL TUMOR  02/19/2001  . TOTAL HIP ARTHROPLASTY Left 12/08/2002  . TRANSTHORACIC ECHOCARDIOGRAM  01/03/2005   ef 55-65%/  trivial AR and MR    FAMILY HISTORY: The patient's family history includes Cancer in his brother; Diabetes in his sister; Stroke in his mother.   SOCIAL HISTORY:  The patient  reports that he quit smoking about 15 years ago. His smoking use included cigars. He quit after 30.00 years of use. He has never used smokeless tobacco. He reports that he does not drink alcohol and does not use drugs.  Review of Systems  Constitutional: Negative for chills and fever.  HENT: Negative for hoarse voice and nosebleeds.    Eyes: Negative for discharge, double vision and pain.  Cardiovascular: Positive for leg swelling (better in the morning. ). Negative for chest pain, claudication, dyspnea on exertion, near-syncope, orthopnea, palpitations, paroxysmal nocturnal dyspnea and syncope.  Respiratory: Negative for hemoptysis and shortness of breath.   Musculoskeletal: Negative for muscle cramps and myalgias.  Gastrointestinal: Negative for abdominal pain, constipation, diarrhea, hematemesis, hematochezia, melena, nausea and vomiting.  Neurological: Negative for dizziness and light-headedness.    PHYSICAL EXAM: Vitals with BMI 11/27/2019 10/21/2018 07/14/2017  Height 5\' 8"  5\' 8"  -  Weight 192 lbs 195 lbs -  BMI 76.8 11.57 -  Systolic 262 - 035  Diastolic 98 - 73  Pulse 79 - 80    CONSTITUTIONAL: Well-developed and well-nourished. No acute distress.  SKIN: Skin is warm and dry. No rash noted. No cyanosis. No pallor. No jaundice HEAD: Normocephalic and atraumatic.  EYES: No scleral icterus MOUTH/THROAT: Moist oral membranes.  NECK: No JVD present. No thyromegaly noted. No carotid bruits  LYMPHATIC: No visible cervical adenopathy.  CHEST Normal respiratory effort. No intercostal retractions  LUNGS: Clear to  auscultation bilaterally.  No stridor. No wheezes. No rales.  CARDIOVASCULAR: Regular, positive W0-J8, soft diastolic murmur heard over the left upper sternal border, no gallops or rubs ABDOMINAL: Soft, nontender, nondistended, positive bowel sounds, no apparent ascites.  EXTREMITIES: +1 bilateral pitting peripheral edema  HEMATOLOGIC: No significant bruising NEUROLOGIC: Oriented to person, place, and time. Nonfocal. Normal muscle tone.  PSYCHIATRIC: Normal mood and affect. Normal behavior. Cooperative  CARDIAC DATABASE: EKG: 11/27/2019: Normal sinus rhythm, 60 bpm, normal axis, without underlying ischemia or injury pattern.  Echocardiogram: 10/17/2018: LVEF 11%, grade 1 diastolic impairment, normal LAP,  mild LVH, mild to moderate AR.   Stress Testing:  Treadmill stress test [04/13/2014]: Conclusions: Negative for ischemia. Marked decrease in exercise tolerence. The patient exercised according to the Bruce protocol, Total time recorded 3 Min. 42 sec. achieving a max heart rate of 157 which was 101% of MPHR for age and 7.3 METS of work. During exercise there was no ST-t changes of ischemia. Symptoms: MPHR achieved. Marked dyspnea. Arrhythmia: None. Continue primary prevention. Increase aerobic activity  Heart Catheterization: None  LABORATORY DATA: CBC Latest Ref Rng & Units 07/10/2017 07/27/2016 09/03/2015  WBC 4.0 - 10.5 K/uL 6.3 - 5.5  Hemoglobin 13.0 - 17.0 g/dL 13.2 12.9(L) 12.6(L)  Hematocrit 39 - 52 % 39.7 38.0(L) 37.0(L)  Platelets 150 - 400 K/uL 277 - 231    CMP Latest Ref Rng & Units 07/10/2017 07/27/2016 09/03/2015  Glucose 65 - 99 mg/dL 77 94 90  BUN 6 - 20 mg/dL 24(H) 17 20  Creatinine 0.61 - 1.24 mg/dL 1.29(H) 1.20 1.18  Sodium 135 - 145 mmol/L 139 138 138  Potassium 3.5 - 5.1 mmol/L 4.6 4.2 3.9  Chloride 101 - 111 mmol/L 107 103 106  CO2 22 - 32 mmol/L 24 - 22  Calcium 8.9 - 10.3 mg/dL 10.2 - 9.6  Total Protein 6.0 - 8.3 g/dL - - -  Total Bilirubin 0.3 - 1.2 mg/dL - - -  Alkaline Phos 39 - 117 U/L - - -  AST 0 - 37 U/L - - -  ALT 0 - 53 U/L - - -    Lipid Panel  No results found for: CHOL, TRIG, HDL, CHOLHDL, VLDL, LDLCALC, LDLDIRECT, LABVLDL  No results found for: HGBA1C No components found for: NTPROBNP No results found for: TSH  Cardiac Panel (last 3 results) No results for input(s): CKTOTAL, CKMB, TROPONINIHS, RELINDX in the last 72 hours.  IMPRESSION:    ICD-10-CM   1. Nonrheumatic aortic valve insufficiency  I35.1 EKG 12-Lead    PCV ECHOCARDIOGRAM COMPLETE  2. Leg swelling  M79.89 furosemide (LASIX) 40 MG tablet    Basic metabolic panel    Magnesium    Magnesium    Basic metabolic panel  3. Essential hypertension  I10   4. Hypercholesterolemia   E78.00      RECOMMENDATIONS: Keith Webb is a 69 y.o. male whose past medical history and cardiovascular risk factors include:  Hypertension with  CKD, prediabetes, hypercholesterolemia, former cigar smoker, history of prostate carcinoma with radical prostatectomy in the past, advanced age.   Lower extremity swelling:  Patient will return swelling to be secondary to chronic venous insufficiency as the swelling improves when he wakes up in the morning.  His lower extremity swelling could also be secondary to Norvasc and he is asked to discuss this further with his primary care provider who is managing his hypertension.  Patient states that the swelling was better when he was on Lasix.  I will refill the Lasix to 40 mg p.o. daily for now but would like to repeat blood work in 1 week to evaluate kidney function and electrolytes.  Continue to monitor.  Aortic regurgitation, asymptomatic:  Recommended repeat echocardiogram in 3 to 5 years since her last study.  However, given his lower extremity swelling would like to reevaluate his LV function and valvular heart disease sooner than originally anticipated.  Benign essential hypertension:  Patient's office blood pressures are currently not at goal.  Given the patient's age and current comorbid conditions would recommend systolic blood pressures to be 130 mmHg or less if tolerable.  Currently managed by primary team.  Patient will discuss it further with his PCP later today at his appointment.  Hypercholesterolemia: Currently on Zetia/simvastatin.  Currently managed per primary team.  Does not endorse myalgias.  FINAL MEDICATION LIST END OF ENCOUNTER: Meds ordered this encounter  Medications  . furosemide (LASIX) 40 MG tablet    Sig: Take 1 tablet (40 mg total) by mouth daily.    Dispense:  90 tablet    Refill:  1      Current Outpatient Medications:  .  amLODipine (NORVASC) 10 MG tablet, Take 10 mg by mouth every morning. ,  Disp: , Rfl:  .  aspirin 325 MG tablet, Take 325 mg by mouth every other day. , Disp: , Rfl:  .  esomeprazole (NEXIUM) 40 MG capsule, Take 40 mg by mouth daily at 12 noon., Disp: , Rfl:  .  ezetimibe-simvastatin (VYTORIN) 10-40 MG per tablet, Take 1 tablet by mouth every morning. , Disp: , Rfl:  .  furosemide (LASIX) 40 MG tablet, Take 1 tablet (40 mg total) by mouth daily., Disp: 90 tablet, Rfl: 1 .  guanFACINE (INTUNIV) 1 MG TB24, Take 1 mg by mouth at bedtime. , Disp: , Rfl:  .  IRON PO, Take 1 tablet by mouth daily., Disp: , Rfl:  .  losartan (COZAAR) 100 MG tablet, TAKE 1 TABLET BY MOUTH DAILY, Disp: 90 tablet, Rfl: 0 .  montelukast (SINGULAIR) 10 MG tablet, Take 10 mg by mouth at bedtime. , Disp: , Rfl:  .  Multiple Vitamins-Minerals (CENTRUM SILVER PO), Take 1 tablet by mouth daily., Disp: , Rfl:  .  nabumetone (RELAFEN) 500 MG tablet, Take 500 mg by mouth daily., Disp: , Rfl:  .  sildenafil (VIAGRA) 100 MG tablet, Take 100 mg by mouth daily as needed for erectile dysfunction., Disp: , Rfl:   Orders Placed This Encounter  Procedures  . Basic metabolic panel  . Magnesium  . EKG 12-Lead  . PCV ECHOCARDIOGRAM COMPLETE   --Continue cardiac medications as reconciled in final medication list. --Return in about 6 months (around 05/28/2020) for re-evaluation of LE swelling and review Echo for AR and LVEF.. Or sooner if needed. --Continue follow-up with your primary care physician regarding the management of your other chronic comorbid conditions.  Patient's questions and concerns were addressed to his satisfaction. He voices understanding of the instructions provided during this encounter.   This note was created using a voice recognition software as a result there may be grammatical errors inadvertently enclosed that do not reflect the nature of this encounter. Every attempt is made to correct such errors.  Rex Kras, [place], [facility]  Pager: [phone] Office: [phone]

## 2019-12-02 ENCOUNTER — Other Ambulatory Visit: Payer: Self-pay

## 2019-12-02 ENCOUNTER — Encounter: Payer: Self-pay | Admitting: Sports Medicine

## 2019-12-02 ENCOUNTER — Ambulatory Visit (INDEPENDENT_AMBULATORY_CARE_PROVIDER_SITE_OTHER): Payer: BC Managed Care – PPO | Admitting: Sports Medicine

## 2019-12-02 VITALS — BP 122/82 | Ht 68.0 in | Wt 191.5 lb

## 2019-12-02 DIAGNOSIS — M2170 Unequal limb length (acquired), unspecified site: Secondary | ICD-10-CM

## 2019-12-02 DIAGNOSIS — I351 Nonrheumatic aortic (valve) insufficiency: Secondary | ICD-10-CM

## 2019-12-02 DIAGNOSIS — R269 Unspecified abnormalities of gait and mobility: Secondary | ICD-10-CM

## 2019-12-03 ENCOUNTER — Encounter: Payer: Self-pay | Admitting: Sports Medicine

## 2019-12-03 NOTE — Progress Notes (Signed)
° °  Subjective:    Patient ID: Keith Webb, male    DOB: [birth year], 69 y.o.   MRN: [id number]  HPI chief complaint: "Short right leg"  Very pleasant 69 year old male comes in today requesting heel lifts for his right shoe.  He is status post left total hip arthroplasty done 17 years ago by Dr. Noemi Chapel.  As a result of that surgery he has a chronic leg length discrepancy, left leg longer than the right.  He has had inserts in the past.  Usually gets them from Dr. Archie Endo office.  Dr. Noemi Chapel recommended that he see [place] for possible custom orthotics.  He also has a known history of right knee DJD and lumbar radiculopathy.  Right knee was injected in May.  He has no scheduled follow-up with Dr. Noemi Chapel.  Past medical history reviewed Medications reviewed Allergies reviewed    Review of Systems    As above Objective:   Physical Exam  Well-developed, well-nourished.  No acute distress.  Awake alert and oriented x3.  Vital signs reviewed  Left hip: Smooth painless hip range of motion with negative logroll.  No tenderness to palpation.  Good strength. He does have a leg length discrepancy with the right leg approximately half an inch shorter than the left.  Evaluation of his gait shows a drop of the right shoulder when walking consistent with his leg length discrepancy.      Assessment & Plan:   Status post remote left hip arthroplasty with chronic leg length discrepancy  Patient's right shoe was fitted with a 7/16 inch heel lift.  This seemed to correct his gait abnormality.  I gave him additional heel lifts to add in other shoes.  I also discussed the possibility of custom orthotics although he does not necessarily need them.  He will check with his insurance.  I also provided him with the information for [employer] so that he can order additional heel lifts on his own in the future.  Follow-up with me as needed.

## 2019-12-10 LAB — BASIC METABOLIC PANEL
BUN/Creatinine Ratio: 14 (ref 10–24)
BUN: 19 mg/dL (ref 8–27)
CO2: 23 mmol/L (ref 20–29)
Calcium: 10.2 mg/dL (ref 8.6–10.2)
Chloride: 105 mmol/L (ref 96–106)
Creatinine, Ser: 1.37 mg/dL — ABNORMAL HIGH (ref 0.76–1.27)
GFR calc Af Amer: 60 mL/min/{1.73_m2} (ref >59)
GFR calc non Af Amer: 52 mL/min/{1.73_m2} — ABNORMAL LOW (ref >59)
Glucose: 94 mg/dL (ref 65–99)
Potassium: 4.2 mmol/L (ref 3.5–5.2)
Sodium: 141 mmol/L (ref 134–144)

## 2019-12-10 LAB — MAGNESIUM: Magnesium: 1.9 mg/dL (ref 1.6–2.3)

## 2019-12-11 ENCOUNTER — Other Ambulatory Visit: Payer: Self-pay

## 2019-12-11 ENCOUNTER — Ambulatory Visit: Payer: BC Managed Care – PPO

## 2019-12-11 DIAGNOSIS — I351 Nonrheumatic aortic (valve) insufficiency: Secondary | ICD-10-CM | POA: Diagnosis not present

## 2019-12-19 ENCOUNTER — Other Ambulatory Visit: Payer: Self-pay

## 2019-12-19 ENCOUNTER — Telehealth: Payer: Self-pay

## 2019-12-19 DIAGNOSIS — M7989 Other specified soft tissue disorders: Secondary | ICD-10-CM

## 2019-12-19 NOTE — Telephone Encounter (Signed)
LVMTCB

## 2019-12-19 NOTE — Progress Notes (Signed)
Called patient, NA, LMAM to call back for results.

## 2019-12-19 NOTE — Telephone Encounter (Signed)
-----   Message from [place], [place] sent at 12/12/2019  5:49 PM EDT ----- Please have him decrease his Lasix to 20 mg p.o. daily. Repeat BMP and creatinine, please place the orders are released in 1 week to reevaluate kidney function after medication change. Please have him follow-up with his primary care physician for blood pressure management and change in medications if clinically warranted.

## 2019-12-19 NOTE — Progress Notes (Signed)
Left message to call back for results

## 2019-12-23 ENCOUNTER — Other Ambulatory Visit: Payer: Self-pay

## 2019-12-23 NOTE — Progress Notes (Signed)
Patient called back, I have discussed  results with him. Patient showed understanding to decrease Lasix to 20mg .

## 2019-12-23 NOTE — Progress Notes (Signed)
Patient called back, I have discussed results with him.

## 2019-12-26 NOTE — Telephone Encounter (Signed)
Left detailed VM concerning labs and Lasix decrease. Patient advised to call back with any questions or concerns.

## 2019-12-29 ENCOUNTER — Other Ambulatory Visit: Payer: Self-pay

## 2019-12-29 DIAGNOSIS — I10 Essential (primary) hypertension: Secondary | ICD-10-CM

## 2019-12-30 ENCOUNTER — Telehealth: Payer: Self-pay

## 2019-12-30 LAB — BMP8+EGFR
BUN/Creatinine Ratio: 15 (ref 10–24)
BUN: 19 mg/dL (ref 8–27)
CO2: 25 mmol/L (ref 20–29)
Calcium: 9.9 mg/dL (ref 8.6–10.2)
Chloride: 105 mmol/L (ref 96–106)
Creatinine, Ser: 1.31 mg/dL — ABNORMAL HIGH (ref 0.76–1.27)
GFR calc Af Amer: 63 mL/min/{1.73_m2} (ref >59)
GFR calc non Af Amer: 54 mL/min/{1.73_m2} — ABNORMAL LOW (ref >59)
Glucose: 81 mg/dL (ref 65–99)
Potassium: 5.2 mmol/L (ref 3.5–5.2)
Sodium: 142 mmol/L (ref 134–144)

## 2019-12-30 NOTE — Telephone Encounter (Signed)
Left vm to cb.

## 2019-12-30 NOTE — Telephone Encounter (Signed)
-----   Message from [place], [place] sent at 12/30/2019  3:42 PM EDT ----- Results reviewed.Serum potassium levels are within normal limits.Kidney function is relatively at baseline when compared to 2 years ago. Continue current medical therapy.Call if questions arise.

## 2020-02-10 ENCOUNTER — Other Ambulatory Visit: Payer: Self-pay | Admitting: Cardiology

## 2020-03-31 DIAGNOSIS — G4739 Other sleep apnea: Secondary | ICD-10-CM | POA: Diagnosis not present

## 2020-03-31 DIAGNOSIS — M130 Polyarthritis, unspecified: Secondary | ICD-10-CM | POA: Diagnosis not present

## 2020-03-31 DIAGNOSIS — I10 Essential (primary) hypertension: Secondary | ICD-10-CM | POA: Diagnosis not present

## 2020-03-31 DIAGNOSIS — R7309 Other abnormal glucose: Secondary | ICD-10-CM | POA: Diagnosis not present

## 2020-04-21 DIAGNOSIS — Z1211 Encounter for screening for malignant neoplasm of colon: Secondary | ICD-10-CM | POA: Diagnosis not present

## 2020-04-21 DIAGNOSIS — K219 Gastro-esophageal reflux disease without esophagitis: Secondary | ICD-10-CM | POA: Diagnosis not present

## 2020-04-21 DIAGNOSIS — R1012 Left upper quadrant pain: Secondary | ICD-10-CM | POA: Diagnosis not present

## 2020-04-29 DIAGNOSIS — D509 Iron deficiency anemia, unspecified: Secondary | ICD-10-CM | POA: Diagnosis not present

## 2020-04-29 DIAGNOSIS — D123 Benign neoplasm of transverse colon: Secondary | ICD-10-CM | POA: Diagnosis not present

## 2020-04-29 DIAGNOSIS — K635 Polyp of colon: Secondary | ICD-10-CM | POA: Diagnosis not present

## 2020-04-29 DIAGNOSIS — Z1211 Encounter for screening for malignant neoplasm of colon: Secondary | ICD-10-CM | POA: Diagnosis not present

## 2020-05-10 ENCOUNTER — Other Ambulatory Visit: Payer: Self-pay | Admitting: Cardiology

## 2020-05-10 DIAGNOSIS — M7989 Other specified soft tissue disorders: Secondary | ICD-10-CM

## 2020-05-28 ENCOUNTER — Ambulatory Visit: Payer: BC Managed Care – PPO | Admitting: Cardiology

## 2020-06-08 ENCOUNTER — Other Ambulatory Visit: Payer: Self-pay

## 2020-06-08 ENCOUNTER — Ambulatory Visit: Payer: BC Managed Care – PPO | Admitting: Cardiology

## 2020-06-08 ENCOUNTER — Encounter: Payer: Self-pay | Admitting: Cardiology

## 2020-06-08 VITALS — BP 121/82 | HR 83 | Resp 16 | Ht 68.0 in | Wt 193.2 lb

## 2020-06-08 DIAGNOSIS — E7800 Pure hypercholesterolemia, unspecified: Secondary | ICD-10-CM

## 2020-06-08 DIAGNOSIS — I351 Nonrheumatic aortic (valve) insufficiency: Secondary | ICD-10-CM

## 2020-06-08 DIAGNOSIS — I10 Essential (primary) hypertension: Secondary | ICD-10-CM | POA: Diagnosis not present

## 2020-06-08 DIAGNOSIS — M7989 Other specified soft tissue disorders: Secondary | ICD-10-CM | POA: Diagnosis not present

## 2020-06-08 MED ORDER — FUROSEMIDE 20 MG PO TABS: 20.0000 mg | ORAL_TABLET | Freq: Every day | ORAL | Status: DC

## 2020-06-08 NOTE — Progress Notes (Signed)
Keith Webb Date of Birth: 18-Jul-1950 MRN: [id number] Primary Care Provider:Bland, Keith Rude, MD Former Cardiology Providers: Dr. Vear Clock Primary Cardiologist: Rex Kras, DO, [facility] (established care 11/27/2019)  Date: 06/08/20 Last Office Visit: 11/27/2019   Chief Complaint  Patient presents with  . Follow-up    6 months follow-up for aortic regurgitation, review test results    HPI  Keith Webb is a 69 y.o.  male who presents to the office with a chief complaint of " 66-month follow-up for aortic regurgitation management and review test results." Patient's past medical history and cardiovascular risk factors include: Hypertension with  CKD, prediabetes, hypercholesterolemia, former cigar smoker, history of prostate carcinoma with radical prostatectomy in the past, advanced age.   Patient was last seen in the office back in June 2021 for lower extremity swelling and aortic regurgitation.  Patient was a started on diuretic therapy and insulin extremity swelling improved significantly.  He was asked to reduce his Lasix to 20 mg p.o. daily.  However, patient continues to take Lasix 40 mg p.o. daily.  Since last office visit he had an echocardiogram in July 2021 which noted preserved LVEF with moderate aortic regurgitation.  Clinically patient is doing well without any symptoms of chest pain or shortness of breath.  He is euvolemic.  No recent hospitalizations or urgent care visits for cardiovascular symptoms.  FUNCTIONAL STATUS: He walks 2-3 miles atleast 3 days off the week.    ALLERGIES: No Known Allergies   MEDICATION LIST PRIOR TO VISIT: Current Outpatient Medications on File Prior to Visit  Medication Sig Dispense Refill  . amLODipine (NORVASC) 10 MG tablet Take 10 mg by mouth every morning.     Marland Kitchen aspirin 325 MG tablet Take 325 mg by mouth every other day.     . esomeprazole (NEXIUM) 40 MG capsule Take 40 mg by mouth daily at 12 noon.    . ezetimibe-simvastatin (VYTORIN)  10-40 MG per tablet Take 1 tablet by mouth every morning.     Marland Kitchen guanFACINE (INTUNIV) 1 MG TB24 Take 1 mg by mouth at bedtime.     . IRON PO Take 1 tablet by mouth daily.    Marland Kitchen losartan (COZAAR) 100 MG tablet TAKE 1 TABLET BY MOUTH DAILY 90 tablet 1  . montelukast (SINGULAIR) 10 MG tablet Take 10 mg by mouth at bedtime.     . Multiple Vitamins-Minerals (CENTRUM SILVER PO) Take 1 tablet by mouth daily.    . nabumetone (RELAFEN) 500 MG tablet Take 500 mg by mouth daily.    . sildenafil (VIAGRA) 100 MG tablet Take 100 mg by mouth daily as needed for erectile dysfunction.     No current facility-administered medications on file prior to visit.    PAST MEDICAL HISTORY: Past Medical History:  Diagnosis Date  . Arthritis   . Environmental and seasonal allergies   . GERD (gastroesophageal reflux disease)   . Heart murmur   . History of syncope    2006  orthostatic hypotension w/ dehydration  . Hypercholesterolemia   . Hypertension   . OSA (obstructive sleep apnea)    per pt used cpap for awhile but stopped because  didn't think he needed any more  . Recurrent prostate cancer [facility]) urologist-  dr budzyni/  oncologist-- dr Valere Dross   first dx 1998 (Gleason 7,  PSA 18.5) s/p  radical prostatectomy---  recurrent 10/ 2013  w/ salvage external radiation 06-24-2014 to 08-10-2014/  per pt (07-21-2016)  last PSA <5  .  S/P radiation therapy 06/24/2014 through 08/10/2014   External beam of Prostate bed 6600 cGy in 33 sessions   . Wears glasses     PAST SURGICAL HISTORY: Past Surgical History:  Procedure Laterality Date  . LAMINECTOMY WITH POSTERIOR LATERAL ARTHRODESIS LEVEL 2 N/A 07/13/2017   Procedure: Laminectomy Lumbar Two-Three, Posterior Lateral Fusion - Lumbar One-Two-Lumbar Two-Three, extension of pedicle screw fixation  -Lumbar One-Lumbar Five;  Surgeon: Eustace Moore, MD;  Location: [facility];  Service: Neurosurgery;   Laterality: N/A;  . POSTERIOR LAMINECTOMY / DECOMPRESSION LUMBAR SPINE  12/09/2009   W/  DECOMPRESSION AND FUSION L5--S1/  REMOVAL EPIDURAL SYNOVIAL CYST (per path no neoplasm present)  . POSTERIOR LAMINECTOMY / DECOMPRESSION LUMBAR SPINE  09/10/2015   decompressive laminectomy L4-5 left/  fixation and arthrodesis L3-5 /  removal hardware L5-S1  . PROSTATECTOMY  04/01/1997   radical w/ bilateral pelvic lymph node dissection  . RECTAL EXAM UNDER ANESTHESIA N/A 07/27/2016   Procedure: RECTAL EXAM UNDER ANESTHESIA;  Surgeon: Michael Boston, MD;  Location: [place];  Service: General;  Laterality: N/A;  Hemorrhoidal ligation, suture pexy  . RIGHT INDEX FINGER SYNOVECTOMY/ EXCISION GIANT CELL TUMOR  02/19/2001  . TOTAL HIP ARTHROPLASTY Left 12/08/2002  . TRANSTHORACIC ECHOCARDIOGRAM  01/03/2005   ef 55-65%/  trivial AR and MR    FAMILY HISTORY: The patient's family history includes Cancer in his brother; Diabetes in his sister; Stroke in his mother.   SOCIAL HISTORY:  The patient  reports that he quit smoking about 16 years ago. His smoking use included cigars. He quit after 30.00 years of use. He has never used smokeless tobacco. He reports that he does not drink alcohol and does not use drugs.  Review of Systems  Constitutional: Negative for chills and fever.  HENT: Negative for hoarse voice and nosebleeds.   Eyes: Negative for discharge, double vision and pain.  Cardiovascular: Negative for chest pain, claudication, dyspnea on exertion, leg swelling, near-syncope, orthopnea, palpitations, paroxysmal nocturnal dyspnea and syncope.  Respiratory: Negative for hemoptysis and shortness of breath.   Musculoskeletal: Negative for muscle cramps and myalgias.  Gastrointestinal: Negative for abdominal pain, constipation, diarrhea, hematemesis, hematochezia, melena, nausea and vomiting.  Neurological: Negative for dizziness and light-headedness.    PHYSICAL EXAM: Vitals with BMI  06/08/2020 12/02/2019 11/27/2019  Height 5\' 8"  5\' 8"  5\' 8"   Weight 193 lbs 3 oz 191 lbs 8 oz 192 lbs  BMI 29.38 [id number]  Systolic [id number]  Diastolic 82 82 98  Pulse 83 - 79    CONSTITUTIONAL: Well-developed and well-nourished. No acute distress.  SKIN: Skin is warm and dry. No rash noted. No cyanosis. No pallor. No jaundice HEAD: Normocephalic and atraumatic.  EYES: No scleral icterus MOUTH/THROAT: Moist oral membranes.  NECK: No JVD present. No thyromegaly noted. No carotid bruits  LYMPHATIC: No visible cervical adenopathy.  CHEST Normal respiratory effort. No intercostal retractions  LUNGS: Clear to auscultation bilaterally.  No stridor. No wheezes. No rales.  CARDIOVASCULAR: Regular, positive Q000111Q, soft diastolic murmur heard over the left upper sternal border, no gallops or rubs ABDOMINAL: Soft, nontender, nondistended, positive bowel sounds, no apparent ascites.  EXTREMITIES: No bilateral pitting edema. HEMATOLOGIC: No significant bruising NEUROLOGIC: Oriented to person, place, and time. Nonfocal. Normal muscle tone.  PSYCHIATRIC: Normal mood and affect. Normal behavior. Cooperative  CARDIAC DATABASE: EKG: 06/08/2020: Sinus  Rhythm, 75bpm, normal axis, no underlying injury pattern.  Echocardiogram: 12/11/2019: Normal LV systolic function with EF 64%. Left ventricle cavity  is normal in size. Mild concentric hypertrophy of the left ventricle. Normal global wall motion. Doppler evidence of grade I (impaired) diastolic dysfunction, normal LAP. Calculated EF 64%. Left atrial cavity is mildly dilated at 4.1 cm. Trileaflet aortic valve. Moderate (Grade III) aortic regurgitation. Mild (Grade I) mitral regurgitation. Mild tricuspid regurgitation. No evidence of pulmonary hypertension. No significant change from 12/15/2018 and 10/12/2016.  Stress Testing:  Treadmill stress test [04/13/2014]: Conclusions: Negative for ischemia. Marked decrease in exercise tolerence. The  patient exercised according to the Bruce protocol, Total time recorded 3 Min. 42 sec. achieving a max heart rate of 157 which was 101% of MPHR for age and 7.3 METS of work. During exercise there was no ST-t changes of ischemia. Symptoms: MPHR achieved. Marked dyspnea. Arrhythmia: None. Continue primary prevention. Increase aerobic activity  Heart Catheterization: None  LABORATORY DATA: CBC Latest Ref Rng & Units 07/10/2017 07/27/2016 09/03/2015  WBC 4.0 - 10.5 K/uL 6.3 - 5.5  Hemoglobin 13.0 - 17.0 g/dL 13.2 12.9(L) 12.6(L)  Hematocrit 39.0 - 52.0 % 39.7 38.0(L) 37.0(L)  Platelets 150 - 400 K/uL 277 - 231    CMP Latest Ref Rng & Units 12/29/2019 12/09/2019 07/10/2017  Glucose 65 - 99 mg/dL 81 94 77  BUN 8 - 27 mg/dL 19 19 24(H)  Creatinine 0.76 - 1.27 mg/dL 1.31(H) 1.37(H) 1.29(H)  Sodium 134 - 144 mmol/L 142 141 139  Potassium 3.5 - 5.2 mmol/L 5.2 4.2 4.6  Chloride 96 - 106 mmol/L 105 105 107  CO2 20 - 29 mmol/L 25 23 24   Calcium 8.6 - 10.2 mg/dL 9.9 10.2 10.2  Total Protein 6.0 - 8.3 g/dL - - -  Total Bilirubin 0.3 - 1.2 mg/dL - - -  Alkaline Phos 39 - 117 U/L - - -  AST 0 - 37 U/L - - -  ALT 0 - 53 U/L - - -    Lipid Panel  No results found for: CHOL, TRIG, HDL, CHOLHDL, VLDL, LDLCALC, LDLDIRECT, LABVLDL  No results found for: HGBA1C No components found for: NTPROBNP No results found for: TSH  Cardiac Panel (last 3 results) No results for input(s): CKTOTAL, CKMB, TROPONINIHS, RELINDX in the last 72 hours.  IMPRESSION:    ICD-10-CM   1. Nonrheumatic aortic valve insufficiency  I35.1 PCV ECHOCARDIOGRAM COMPLETE  2. Essential hypertension  I10 EKG 12-Lead  3. Leg swelling  M79.89 furosemide (LASIX) 20 MG tablet  4. Hypercholesterolemia  E78.00      RECOMMENDATIONS: Keith Webb is a 69 y.o. male whose past medical history and cardiovascular risk factors include:  Hypertension with  CKD, prediabetes, hypercholesterolemia, former cigar smoker, history of prostate carcinoma  with radical prostatectomy in the past, advanced age.   Aortic regurgitation, asymptomatic:  Reviewed the most recent echocardiogram results with the patient and his of July 2021.  Recommend 1 year follow-up echocardiogram.    Continue current medical therapy.  Lower extremity swelling: Resolved.  We will decrease Lasix to 20 mg p.o. daily.  If  lower extremity swelling resurfaces patient is asked to follow-up with his PCP as his antihypertensive medications are being managed by primary care provider.  He may benefit from transitioning off of Norvasc to another antihypertensive medication.  Will defer blood pressure management to his PCP.   Benign essential hypertension:  Patient's office blood pressures are currently at goal.  Currently managed by primary team.  Hypercholesterolemia: Currently on Zetia/simvastatin.  Currently managed per primary team.  Does not endorse myalgias.  Total time spent:  20 minutes.  FINAL MEDICATION LIST END OF ENCOUNTER: Meds ordered this encounter  Medications  . furosemide (LASIX) 20 MG tablet    Sig: Take 1 tablet (20 mg total) by mouth daily.      Current Outpatient Medications:  .  amLODipine (NORVASC) 10 MG tablet, Take 10 mg by mouth every morning. , Disp: , Rfl:  .  aspirin 325 MG tablet, Take 325 mg by mouth every other day. , Disp: , Rfl:  .  esomeprazole (NEXIUM) 40 MG capsule, Take 40 mg by mouth daily at 12 noon., Disp: , Rfl:  .  ezetimibe-simvastatin (VYTORIN) 10-40 MG per tablet, Take 1 tablet by mouth every morning. , Disp: , Rfl:  .  guanFACINE (INTUNIV) 1 MG TB24, Take 1 mg by mouth at bedtime. , Disp: , Rfl:  .  IRON PO, Take 1 tablet by mouth daily., Disp: , Rfl:  .  losartan (COZAAR) 100 MG tablet, TAKE 1 TABLET BY MOUTH DAILY, Disp: 90 tablet, Rfl: 1 .  montelukast (SINGULAIR) 10 MG tablet, Take 10 mg by mouth at bedtime. , Disp: , Rfl:  .  Multiple Vitamins-Minerals (CENTRUM SILVER PO), Take 1 tablet by mouth daily.,  Disp: , Rfl:  .  nabumetone (RELAFEN) 500 MG tablet, Take 500 mg by mouth daily., Disp: , Rfl:  .  sildenafil (VIAGRA) 100 MG tablet, Take 100 mg by mouth daily as needed for erectile dysfunction., Disp: , Rfl:  .  furosemide (LASIX) 20 MG tablet, Take 1 tablet (20 mg total) by mouth daily., Disp: , Rfl:   Orders Placed This Encounter  Procedures  . EKG 12-Lead  . PCV ECHOCARDIOGRAM COMPLETE   --Continue cardiac medications as reconciled in final medication list. --Return in about 7 months (around 01/10/2021) for Follow up AR , Review test results. Or sooner if needed. --Continue follow-up with your primary care physician regarding the management of your other chronic comorbid conditions.  Patient's questions and concerns were addressed to his satisfaction. He voices understanding of the instructions provided during this encounter.   This note was created using a voice recognition software as a result there may be grammatical errors inadvertently enclosed that do not reflect the nature of this encounter. Every attempt is made to correct such errors.  Tessa Lerner, [place], [facility]  Pager: [phone] Office: [phone]

## 2020-06-14 ENCOUNTER — Other Ambulatory Visit: Payer: Self-pay

## 2020-06-14 ENCOUNTER — Ambulatory Visit: Payer: BC Managed Care – PPO

## 2020-06-14 DIAGNOSIS — I351 Nonrheumatic aortic (valve) insufficiency: Secondary | ICD-10-CM | POA: Diagnosis not present

## 2020-06-16 NOTE — Progress Notes (Signed)
Spoke to patient - he is aware of his results. 

## 2020-08-03 DIAGNOSIS — I10 Essential (primary) hypertension: Secondary | ICD-10-CM | POA: Diagnosis not present

## 2020-08-03 DIAGNOSIS — D642 Secondary sideroblastic anemia due to drugs and toxins: Secondary | ICD-10-CM | POA: Diagnosis not present

## 2020-08-03 DIAGNOSIS — M150 Primary generalized (osteo)arthritis: Secondary | ICD-10-CM | POA: Diagnosis not present

## 2020-08-03 DIAGNOSIS — E782 Mixed hyperlipidemia: Secondary | ICD-10-CM | POA: Diagnosis not present

## 2020-08-08 ENCOUNTER — Other Ambulatory Visit: Payer: Self-pay | Admitting: Cardiology

## 2020-10-12 DIAGNOSIS — M545 Low back pain, unspecified: Secondary | ICD-10-CM | POA: Diagnosis not present

## 2020-10-12 DIAGNOSIS — M25562 Pain in left knee: Secondary | ICD-10-CM | POA: Diagnosis not present

## 2020-10-12 DIAGNOSIS — M25561 Pain in right knee: Secondary | ICD-10-CM | POA: Diagnosis not present

## 2020-10-12 DIAGNOSIS — M1611 Unilateral primary osteoarthritis, right hip: Secondary | ICD-10-CM | POA: Diagnosis not present

## 2020-12-22 DIAGNOSIS — M722 Plantar fascial fibromatosis: Secondary | ICD-10-CM | POA: Diagnosis not present

## 2020-12-22 DIAGNOSIS — M7731 Calcaneal spur, right foot: Secondary | ICD-10-CM | POA: Diagnosis not present

## 2020-12-22 DIAGNOSIS — M79671 Pain in right foot: Secondary | ICD-10-CM | POA: Diagnosis not present

## 2020-12-22 DIAGNOSIS — M71571 Other bursitis, not elsewhere classified, right ankle and foot: Secondary | ICD-10-CM | POA: Diagnosis not present

## 2021-01-05 DIAGNOSIS — M722 Plantar fascial fibromatosis: Secondary | ICD-10-CM | POA: Diagnosis not present

## 2021-01-05 DIAGNOSIS — M71571 Other bursitis, not elsewhere classified, right ankle and foot: Secondary | ICD-10-CM | POA: Diagnosis not present

## 2021-01-06 ENCOUNTER — Encounter: Payer: Self-pay | Admitting: Cardiology

## 2021-01-06 ENCOUNTER — Ambulatory Visit: Payer: BC Managed Care – PPO | Admitting: Cardiology

## 2021-01-06 ENCOUNTER — Other Ambulatory Visit: Payer: Self-pay

## 2021-01-06 VITALS — BP 127/87 | HR 89 | Temp 98.3°F | Resp 16 | Ht 68.0 in | Wt 185.0 lb

## 2021-01-06 DIAGNOSIS — I10 Essential (primary) hypertension: Secondary | ICD-10-CM

## 2021-01-06 DIAGNOSIS — I351 Nonrheumatic aortic (valve) insufficiency: Secondary | ICD-10-CM

## 2021-01-06 DIAGNOSIS — E7800 Pure hypercholesterolemia, unspecified: Secondary | ICD-10-CM | POA: Diagnosis not present

## 2021-01-06 DIAGNOSIS — M7989 Other specified soft tissue disorders: Secondary | ICD-10-CM

## 2021-01-06 NOTE — Progress Notes (Signed)
[name] Date of Birth: Dec 09, 1950 MRN: [id number] Primary Care Provider:Bland, Myra Rude, MD Former Cardiology Providers: Dr. Vear Clock Primary Cardiologist: Keith Kras, DO, [facility] (established care 11/27/2019)  Date: 01/06/21 Last Office Visit: 06/08/2021  Chief Complaint  Patient presents with   Nonrheumatic aortic valve insufficiency   Results   Follow-up    HPI  Keith Webb is a 70 y.o.  male who presents to the office with a chief complaint of " 83-monthfollow-up for aortic regurgitation management." Patient's past medical history and cardiovascular risk factors include: Hypertension with  CKD, prediabetes, hypercholesterolemia, former cigar smoker, history of prostate carcinoma with radical prostatectomy in the past, advanced age.   Patient is being followed by our practice for the management of aortic regurgitation.  He now presents for 662-monthollow-up.  Clinically he is doing well and denies any chest pain or anginal equivalent.  He is overall euvolemic and not in congestive heart failure.  Since transitioning him from Lasix 40 mg p.o. daily to 20 mg p.o. daily patient states that his lower extremity swelling is very well controlled.  Patient is in the process of having the CPAP ordered to help treat his underlying sleep apnea.  No hospitalizations or urgent care visits since last office encounter.  Echocardiogram results from January 2022 reviewed with the patient.  FUNCTIONAL STATUS: He walks 2-3 miles atleast 3 days off the week.    ALLERGIES: No Known Allergies   MEDICATION LIST PRIOR TO VISIT: Current Outpatient Medications on File Prior to Visit  Medication Sig Dispense Refill   amLODipine (NORVASC) 10 MG tablet Take 10 mg by mouth every morning.      aspirin 325 MG tablet Take 325 mg by mouth every other day.     esomeprazole (NEXIUM) 40 MG capsule Take 40 mg by mouth daily at 12 noon.     ezetimibe-simvastatin (VYTORIN) 10-40 MG per tablet Take 1  tablet by mouth every morning.      furosemide (LASIX) 20 MG tablet Take 1 tablet (20 mg total) by mouth daily.     guanFACINE (INTUNIV) 1 MG TB24 Take 1 mg by mouth at bedtime.      IRON PO Take 1 tablet by mouth daily.     losartan (COZAAR) 100 MG tablet TAKE 1 TABLET BY MOUTH DAILY 90 tablet 1   montelukast (SINGULAIR) 10 MG tablet Take 10 mg by mouth at bedtime.      Multiple Vitamins-Minerals (CENTRUM SILVER PO) Take 1 tablet by mouth daily.     sildenafil (VIAGRA) 100 MG tablet Take 100 mg by mouth daily as needed for erectile dysfunction.     No current facility-administered medications on file prior to visit.    PAST MEDICAL HISTORY: Past Medical History:  Diagnosis Date   Arthritis    Environmental and seasonal allergies    GERD (gastroesophageal reflux disease)    Heart murmur    History of syncope    2006  orthostatic hypotension w/ dehydration   Hypercholesterolemia    Hypertension    OSA (obstructive sleep apnea)    per pt used cpap for awhile but stopped because  didn't think he needed any more   [facility]  [facility] buMertie Mooresoncologist-- dr muValere Dross first dx 1998 (Gleason 7,  PSA 18.5) s/p  radical prostatectomy---  recurrent 10/ 2013  w/ salvage external radiation 06-24-2014 to 08-10-2014/  per pt (07-21-2016)  last PSA <5   S/P radiation therapy  06/24/2014 through 08/10/2014                                                     External beam of Prostate bed 6600 cGy in 33 sessions                          Wears glasses     PAST SURGICAL HISTORY: Past Surgical History:  Procedure Laterality Date   LAMINECTOMY WITH POSTERIOR LATERAL ARTHRODESIS LEVEL 2 N/A 07/13/2017   Procedure: Laminectomy Lumbar Two-Three, Posterior Lateral Fusion - Lumbar One-Two-Lumbar Two-Three, extension of pedicle screw fixation  -Lumbar One-Lumbar Five;  Surgeon: Eustace Moore, MD;  Location: [place];  Service: Neurosurgery;  Laterality: N/A;   POSTERIOR LAMINECTOMY /  DECOMPRESSION LUMBAR SPINE  12/09/2009   W/  DECOMPRESSION AND FUSION L5--S1/  REMOVAL EPIDURAL SYNOVIAL CYST (per path no neoplasm present)   POSTERIOR LAMINECTOMY / DECOMPRESSION LUMBAR SPINE  09/10/2015   decompressive laminectomy L4-5 left/  fixation and arthrodesis L3-5 /  removal hardware L5-S1   PROSTATECTOMY  04/01/1997   radical w/ bilateral pelvic lymph node dissection   RECTAL EXAM UNDER ANESTHESIA N/A 07/27/2016   Procedure: RECTAL EXAM UNDER ANESTHESIA;  Surgeon: Michael Boston, MD;  Location: [place];  Service: General;  Laterality: N/A;  Hemorrhoidal ligation, suture pexy   RIGHT INDEX FINGER SYNOVECTOMY/ EXCISION GIANT CELL TUMOR  02/19/2001   TOTAL HIP ARTHROPLASTY Left 12/08/2002   TRANSTHORACIC ECHOCARDIOGRAM  01/03/2005   ef 55-65%/  trivial AR and MR    FAMILY HISTORY: The patient's family history includes Cancer in his brother; Diabetes in his sister; Stroke in his mother.   SOCIAL HISTORY:  The patient  reports that he quit smoking about 16 years ago. His smoking use included cigars. He has never used smokeless tobacco. He reports that he does not drink alcohol and does not use drugs.  Review of Systems  Constitutional: Negative for chills and fever.  HENT:  Negative for hoarse voice and nosebleeds.   Eyes:  Negative for discharge, double vision and pain.  Cardiovascular:  Negative for chest pain, claudication, dyspnea on exertion, leg swelling, near-syncope, orthopnea, palpitations, paroxysmal nocturnal dyspnea and syncope.  Respiratory:  Negative for hemoptysis and shortness of breath.   Musculoskeletal:  Negative for muscle cramps and myalgias.  Gastrointestinal:  Negative for abdominal pain, constipation, diarrhea, hematemesis, hematochezia, melena, nausea and vomiting.  Neurological:  Negative for dizziness and light-headedness.   PHYSICAL EXAM: Vitals with BMI 01/06/2021 06/08/2020 12/02/2019  Height '5\' 8"'$  '5\' 8"'$  '5\' 8"'$   Weight 185 lbs 193 lbs  3 oz 191 lbs 8 oz  BMI 28.14 [id number]  Systolic [id number]  Diastolic 87 82 82  Pulse 89 83 -    CONSTITUTIONAL: Well-developed and well-nourished. No acute distress.  SKIN: Skin is warm and dry. No rash noted. No cyanosis. No pallor. No jaundice HEAD: Normocephalic and atraumatic.  EYES: No scleral icterus MOUTH/THROAT: Moist oral membranes.  NECK: No JVD present. No thyromegaly noted. No carotid bruits  LYMPHATIC: No visible cervical adenopathy.  CHEST Normal respiratory effort. No intercostal retractions  LUNGS: Clear to auscultation bilaterally.  No stridor. No wheezes. No rales.  CARDIOVASCULAR: Regular, positive Q000111Q, soft diastolic murmur heard over the left upper sternal border, no gallops or rubs  ABDOMINAL: Soft, nontender, nondistended, positive bowel sounds, no apparent ascites.  EXTREMITIES: No bilateral pitting edema. HEMATOLOGIC: No significant bruising NEUROLOGIC: Oriented to person, place, and time. Nonfocal. Normal muscle tone.  PSYCHIATRIC: Normal mood and affect. Normal behavior. Cooperative  CARDIAC DATABASE: EKG: 06/08/2020: Sinus  Rhythm, 75bpm, normal axis, no underlying injury pattern.  Echocardiogram: 06/14/2020: Normal LV systolic function with visual EF 60-65%. Left ventricle cavity is normal in size. Normal global wall motion. Normal diastolic filling pattern, normal LAP. Calculated EF 65%. Mild (Grade I) aortic regurgitation. Prior study dated 12/11/2019: LVEF 64%, G1DD, Moderate AR, Mild MR, Mild TR, no PHTN.  Stress Testing:  Treadmill stress test  [04/13/2014]: Conclusions: Negative for ischemia. Marked decrease in exercise tolerence. The patient exercised according to the Bruce protocol, Total time recorded 3 Min. 42 sec. achieving a max heart rate of 157 which was 101% of MPHR for age and 7.3 METS of work. During exercise there was no ST-t changes of ischemia. Symptoms: MPHR achieved. Marked dyspnea. Arrhythmia: None. Continue primary  prevention. Increase aerobic activity  Heart Catheterization: None  LABORATORY DATA: CBC Latest Ref Rng & Units 07/10/2017 07/27/2016 09/03/2015  WBC 4.0 - 10.5 K/uL 6.3 - 5.5  Hemoglobin 13.0 - 17.0 g/dL 13.2 12.9(L) 12.6(L)  Hematocrit 39.0 - 52.0 % 39.7 38.0(L) 37.0(L)  Platelets 150 - 400 K/uL 277 - 231    CMP Latest Ref Rng & Units 12/29/2019 12/09/2019 07/10/2017  Glucose 65 - 99 mg/dL 81 94 77  BUN 8 - 27 mg/dL 19 19 24(H)  Creatinine 0.76 - 1.27 mg/dL 1.31(H) 1.37(H) 1.29(H)  Sodium 134 - 144 mmol/L 142 141 139  Potassium 3.5 - 5.2 mmol/L 5.2 4.2 4.6  Chloride 96 - 106 mmol/L 105 105 107  CO2 20 - 29 mmol/L '25 23 24  '$ Calcium 8.6 - 10.2 mg/dL 9.9 10.2 10.2  Total Protein 6.0 - 8.3 g/dL - - -  Total Bilirubin 0.3 - 1.2 mg/dL - - -  Alkaline Phos 39 - 117 U/L - - -  AST 0 - 37 U/L - - -  ALT 0 - 53 U/L - - -    Lipid Panel  No results found for: CHOL, TRIG, HDL, CHOLHDL, VLDL, LDLCALC, LDLDIRECT, LABVLDL  No results found for: HGBA1C No components found for: NTPROBNP No results found for: TSH  Cardiac Panel (last 3 results) No results for input(s): CKTOTAL, CKMB, TROPONINIHS, RELINDX in the last 72 hours.  IMPRESSION:    ICD-10-CM   1. Nonrheumatic aortic valve insufficiency  I35.1 EKG 12-Lead    ECHOCARDIOGRAM COMPLETE    2. Essential hypertension  99991111 Basic metabolic panel    Magnesium    3. Hypercholesterolemia  E78.00        RECOMMENDATIONS: BAYRON CARAVALHO is a 70 y.o. male whose past medical history and cardiovascular risk factors include:  Hypertension with  CKD, prediabetes, hypercholesterolemia, former cigar smoker, history of prostate carcinoma with radical prostatectomy in the past, advanced age.   Aortic regurgitation, asymptomatic: Reviewed the echo results from January 2022.   Repeat echocardiogram in January 2023 prior to the next office visit.   Continue current medical therapy.  Benign essential hypertension: Patient's office blood pressures  are currently at goal. Currently managed by primary team. Given the fact that he is on Lasix 20 mg p.o. daily recommend blood work prior to the next office visit to evaluate kidney function and electrolytes  Hypercholesterolemia:  Currently on Zetia/simvastatin.  Currently managed per primary team.  Does not  endorse myalgias.  FINAL MEDICATION LIST END OF ENCOUNTER: No orders of the defined types were placed in this encounter.     Current Outpatient Medications:    amLODipine (NORVASC) 10 MG tablet, Take 10 mg by mouth every morning. , Disp: , Rfl:    aspirin 325 MG tablet, Take 325 mg by mouth every other day., Disp: , Rfl:    esomeprazole (NEXIUM) 40 MG capsule, Take 40 mg by mouth daily at 12 noon., Disp: , Rfl:    ezetimibe-simvastatin (VYTORIN) 10-40 MG per tablet, Take 1 tablet by mouth every morning. , Disp: , Rfl:    furosemide (LASIX) 20 MG tablet, Take 1 tablet (20 mg total) by mouth daily., Disp: , Rfl:    guanFACINE (INTUNIV) 1 MG TB24, Take 1 mg by mouth at bedtime. , Disp: , Rfl:    IRON PO, Take 1 tablet by mouth daily., Disp: , Rfl:    losartan (COZAAR) 100 MG tablet, TAKE 1 TABLET BY MOUTH DAILY, Disp: 90 tablet, Rfl: 1   montelukast (SINGULAIR) 10 MG tablet, Take 10 mg by mouth at bedtime. , Disp: , Rfl:    Multiple Vitamins-Minerals (CENTRUM SILVER PO), Take 1 tablet by mouth daily., Disp: , Rfl:    sildenafil (VIAGRA) 100 MG tablet, Take 100 mg by mouth daily as needed for erectile dysfunction., Disp: , Rfl:   Orders Placed This Encounter  Procedures   Basic metabolic panel   Magnesium   EKG 12-Lead   ECHOCARDIOGRAM COMPLETE   --Continue cardiac medications as reconciled in final medication list. --Return in about 6 months (around 07/13/2021) for Follow up [place], review echo and labs. . Or sooner if needed. --Continue follow-up with your primary care physician regarding the management of your other chronic comorbid conditions.  Patient's questions and concerns were  addressed to his satisfaction. He voices understanding of the instructions provided during this encounter.   This note was created using a voice recognition software as a result there may be grammatical errors inadvertently enclosed that do not reflect the nature of this encounter. Every attempt is made to correct such errors.  Keith Webb, [place], [facility]  Pager: [phone] Office: [phone]

## 2021-01-11 ENCOUNTER — Other Ambulatory Visit: Payer: Self-pay

## 2021-01-11 DIAGNOSIS — I10 Essential (primary) hypertension: Secondary | ICD-10-CM

## 2021-01-20 DIAGNOSIS — M722 Plantar fascial fibromatosis: Secondary | ICD-10-CM | POA: Diagnosis not present

## 2021-01-20 DIAGNOSIS — M71571 Other bursitis, not elsewhere classified, right ankle and foot: Secondary | ICD-10-CM | POA: Diagnosis not present

## 2021-02-04 ENCOUNTER — Other Ambulatory Visit: Payer: Self-pay | Admitting: Cardiology

## 2021-02-04 DIAGNOSIS — M12271 Villonodular synovitis (pigmented), right ankle and foot: Secondary | ICD-10-CM | POA: Diagnosis not present

## 2021-02-04 DIAGNOSIS — M722 Plantar fascial fibromatosis: Secondary | ICD-10-CM | POA: Diagnosis not present

## 2021-04-25 DIAGNOSIS — K219 Gastro-esophageal reflux disease without esophagitis: Secondary | ICD-10-CM | POA: Diagnosis not present

## 2021-05-09 ENCOUNTER — Other Ambulatory Visit: Payer: Self-pay | Admitting: Cardiology

## 2021-05-18 DIAGNOSIS — I10 Essential (primary) hypertension: Secondary | ICD-10-CM | POA: Diagnosis not present

## 2021-05-18 DIAGNOSIS — D642 Secondary sideroblastic anemia due to drugs and toxins: Secondary | ICD-10-CM | POA: Diagnosis not present

## 2021-05-18 DIAGNOSIS — E782 Mixed hyperlipidemia: Secondary | ICD-10-CM | POA: Diagnosis not present

## 2021-05-18 DIAGNOSIS — C61 Malignant neoplasm of prostate: Secondary | ICD-10-CM | POA: Diagnosis not present

## 2021-05-28 ENCOUNTER — Other Ambulatory Visit: Payer: Self-pay | Admitting: Cardiology

## 2021-05-28 DIAGNOSIS — M7989 Other specified soft tissue disorders: Secondary | ICD-10-CM

## 2021-06-17 ENCOUNTER — Other Ambulatory Visit: Payer: Self-pay | Admitting: Cardiology

## 2021-06-17 DIAGNOSIS — M7989 Other specified soft tissue disorders: Secondary | ICD-10-CM

## 2021-07-08 ENCOUNTER — Ambulatory Visit (HOSPITAL_COMMUNITY)
Admission: RE | Admit: 2021-07-08 | Discharge: 2021-07-08 | Disposition: A | Discharge status: Home / Self Care | Payer: BC Managed Care – PPO | Source: Ambulatory Visit | Attending: Cardiology | Admitting: Cardiology

## 2021-07-08 ENCOUNTER — Other Ambulatory Visit: Payer: Self-pay

## 2021-07-08 DIAGNOSIS — I358 Other nonrheumatic aortic valve disorders: Secondary | ICD-10-CM | POA: Insufficient documentation

## 2021-07-08 DIAGNOSIS — I10 Essential (primary) hypertension: Secondary | ICD-10-CM | POA: Diagnosis not present

## 2021-07-08 DIAGNOSIS — I351 Nonrheumatic aortic (valve) insufficiency: Secondary | ICD-10-CM | POA: Insufficient documentation

## 2021-07-08 DIAGNOSIS — I77819 Aortic ectasia, unspecified site: Secondary | ICD-10-CM | POA: Diagnosis not present

## 2021-07-08 LAB — ECHOCARDIOGRAM COMPLETE
AR max vel: 1.81 cm2
AV Area VTI: 1.89 cm2
AV Area mean vel: 1.75 cm2
AV Mean grad: 5.5 mmHg
AV Peak grad: 11.2 mmHg
Ao pk vel: 1.67 m/s
Area-P 1/2: 2.93 cm2
Calc EF: 57.7 %
P 1/2 time: 817 msec
S' Lateral: 3.1 cm
Single Plane A2C EF: 58.4 %
Single Plane A4C EF: 58.8 %

## 2021-07-15 NOTE — Progress Notes (Signed)
Called and spoke with patient regarding his echocardiogram results. Call transferred to [phone]) to schedule follow up appointment.

## 2021-07-20 ENCOUNTER — Encounter: Payer: Self-pay | Admitting: Cardiology

## 2021-07-20 ENCOUNTER — Other Ambulatory Visit: Payer: Self-pay

## 2021-07-20 ENCOUNTER — Ambulatory Visit: Payer: BC Managed Care – PPO | Admitting: Cardiology

## 2021-07-20 VITALS — BP 136/82 | HR 84 | Temp 98.5°F | Resp 16 | Ht 68.0 in | Wt 189.0 lb

## 2021-07-20 DIAGNOSIS — I351 Nonrheumatic aortic (valve) insufficiency: Secondary | ICD-10-CM

## 2021-07-20 DIAGNOSIS — E7800 Pure hypercholesterolemia, unspecified: Secondary | ICD-10-CM | POA: Diagnosis not present

## 2021-07-20 DIAGNOSIS — I10 Essential (primary) hypertension: Secondary | ICD-10-CM | POA: Diagnosis not present

## 2021-07-20 NOTE — Progress Notes (Signed)
Keith Webb Date of Birth: [birth year] MRN: [id number] Primary Care Provider:Bland, Myra Rude, MD Former Cardiology Providers: Dr. Vear Clock Primary Cardiologist: Rex Kras, DO, [facility] (established care 11/27/2019)  Date: 07/20/21 Last Office Visit: January 06, 2021  Chief Complaint  Patient presents with   Results    Echo results   Follow-up    6 months    HPI  Keith Webb is a 71 y.o.  male who presents to the office with a chief complaint of " 16-month follow-up for aortic regurgitation management and recent echo results." Patient's past medical history and cardiovascular risk factors include: Hypertension with  CKD, prediabetes, hypercholesterolemia, former cigar smoker, history of prostate carcinoma with radical prostatectomy in the past, advanced age.   Patient is being followed by the practice for management of aortic regurgitation.  He now presents for 34-month follow-up.  He had an echocardiogram in January 2023 at [facility] which notes preserved LVEF with mild to moderate aortic regurgitation.  Clinically remains stable.  No hospitalizations or urgent care visits since last office encounter for cardiovascular symptoms.  His overall physical endurance remains relatively stable.  He continues to walk 2 or 3 miles per day at least 3 days of the week.  He recently had labs with his PCP approximately 3 months ago.  We will request records.  FUNCTIONAL STATUS: He walks 2-3 miles atleast 3 days off the week.    ALLERGIES: No Known Allergies   MEDICATION LIST PRIOR TO VISIT: Current Outpatient Medications on File Prior to Visit  Medication Sig Dispense Refill   amLODipine (NORVASC) 10 MG tablet Take 10 mg by mouth every morning.      aspirin 325 MG tablet Take 325 mg by mouth every other day.     esomeprazole (NEXIUM) 40 MG capsule Take 40 mg by mouth daily at 12 noon.     ezetimibe-simvastatin (VYTORIN) 10-40 MG per tablet Take 1 tablet by mouth every morning.       guanFACINE (INTUNIV) 1 MG TB24 Take 1 mg by mouth at bedtime.      IRON PO Take 1 tablet by mouth daily.     losartan (COZAAR) 100 MG tablet TAKE 1 TABLET BY MOUTH DAILY 90 tablet 1   montelukast (SINGULAIR) 10 MG tablet Take 10 mg by mouth at bedtime.      Multiple Vitamins-Minerals (CENTRUM SILVER PO) Take 1 tablet by mouth daily.     sildenafil (VIAGRA) 100 MG tablet Take 100 mg by mouth daily as needed for erectile dysfunction.     furosemide (LASIX) 20 MG tablet Take 1 tablet (20 mg total) by mouth daily. (Patient not taking: Reported on 07/20/2021)     No current facility-administered medications on file prior to visit.    PAST MEDICAL HISTORY: Past Medical History:  Diagnosis Date   Arthritis    Environmental and seasonal allergies    GERD (gastroesophageal reflux disease)    Heart murmur    History of syncope    2006  orthostatic hypotension w/ dehydration   Hypercholesterolemia    Hypertension    OSA (obstructive sleep apnea)    per pt used cpap for awhile but stopped because  didn't think he needed any more   Recurrent prostate cancer [facility]) urologist-  dr Mertie Moores  oncologist-- dr Valere Dross   first dx 1998 (Gleason 7,  PSA 18.5) s/p  radical prostatectomy---  recurrent 10/ 2013  w/ salvage external radiation 06-24-2014 to 08-10-2014/  per pt (07-21-2016)  last PSA <5   S/P radiation therapy 06/24/2014 through 08/10/2014                                                     External beam of Prostate bed 6600 cGy in 33 sessions                          Wears glasses     PAST SURGICAL HISTORY: Past Surgical History:  Procedure Laterality Date   LAMINECTOMY WITH POSTERIOR LATERAL ARTHRODESIS LEVEL 2 N/A 07/13/2017   Procedure: Laminectomy Lumbar Two-Three, Posterior Lateral Fusion - Lumbar One-Two-Lumbar Two-Three, extension of pedicle screw fixation  -Lumbar One-Lumbar Five;  Surgeon: Eustace Moore, MD;  Location: [place];  Service: Neurosurgery;  Laterality: N/A;   POSTERIOR  LAMINECTOMY / DECOMPRESSION LUMBAR SPINE  12/09/2009   W/  DECOMPRESSION AND FUSION L5--S1/  REMOVAL EPIDURAL SYNOVIAL CYST (per path no neoplasm present)   POSTERIOR LAMINECTOMY / DECOMPRESSION LUMBAR SPINE  09/10/2015   decompressive laminectomy L4-5 left/  fixation and arthrodesis L3-5 /  removal hardware L5-S1   PROSTATECTOMY  04/01/1997   radical w/ bilateral pelvic lymph node dissection   RECTAL EXAM UNDER ANESTHESIA N/A 07/27/2016   Procedure: RECTAL EXAM UNDER ANESTHESIA;  Surgeon: Michael Boston, MD;  Location: [place];  Service: General;  Laterality: N/A;  Hemorrhoidal ligation, suture pexy   RIGHT INDEX FINGER SYNOVECTOMY/ EXCISION GIANT CELL TUMOR  02/19/2001   TOTAL HIP ARTHROPLASTY Left 12/08/2002   TRANSTHORACIC ECHOCARDIOGRAM  01/03/2005   ef 55-65%/  trivial AR and MR    FAMILY HISTORY: The patient's family history includes Aneurysm in his sister; Cancer in his brother; Diabetes in his sister; Other in his brother; Stroke in his mother.   SOCIAL HISTORY:  The patient  reports that he quit smoking about 17 years ago. His smoking use included cigars. He has never used smokeless tobacco. He reports that he does not currently use drugs after having used the following drugs: Marijuana and Cocaine. He reports that he does not drink alcohol.  Review of Systems  Constitutional: Negative for chills and fever.  HENT:  Negative for hoarse voice and nosebleeds.   Eyes:  Negative for discharge, double vision and pain.  Cardiovascular:  Negative for chest pain, claudication, dyspnea on exertion, leg swelling, near-syncope, orthopnea, palpitations, paroxysmal nocturnal dyspnea and syncope.  Respiratory:  Negative for hemoptysis and shortness of breath.   Musculoskeletal:  Negative for muscle cramps and myalgias.  Gastrointestinal:  Negative for abdominal pain, constipation, diarrhea, hematemesis, hematochezia, melena, nausea and vomiting.  Neurological:  Negative for  dizziness and light-headedness.   PHYSICAL EXAM: Vitals with BMI 07/20/2021 01/06/2021 06/08/2020  Height 5\' 8"  5\' 8"  5\' 8"   Weight 189 lbs 185 lbs 193 lbs 3 oz  BMI 28.74 44.81 85.63  Systolic 149 702 637  Diastolic 82 87 82  Pulse 84 89 83    CONSTITUTIONAL: Well-developed and well-nourished. No acute distress.  SKIN: Skin is warm and dry. No rash noted. No cyanosis. No pallor. No jaundice HEAD: Normocephalic and atraumatic.  EYES: No scleral icterus MOUTH/THROAT: Moist oral membranes.  NECK: No JVD present. No thyromegaly noted. No carotid bruits  LYMPHATIC: No visible cervical adenopathy.  CHEST Normal respiratory effort. No intercostal retractions  LUNGS: Clear to auscultation  bilaterally.  No stridor. No wheezes. No rales.  CARDIOVASCULAR: Regular, positive Z6-X0, soft diastolic murmur heard over the left upper sternal border, no gallops or rubs ABDOMINAL: Soft, nontender, nondistended, positive bowel sounds, no apparent ascites.  EXTREMITIES: No bilateral pitting edema. HEMATOLOGIC: No significant bruising NEUROLOGIC: Oriented to person, place, and time. Nonfocal. Normal muscle tone.  PSYCHIATRIC: Normal mood and affect. Normal behavior. Cooperative  CARDIAC DATABASE: EKG: 06/08/2020: Sinus  Rhythm, 75bpm, normal axis, no underlying injury pattern.  Echocardiogram: 07/08/2021: LVEF 55 to 60%, no regional wall motion modalities, mild LVH, indeterminate diastolic function, normal right ventricular size and function, mild to moderate aortic regurgitation, aortic valve sclerosis without stenosis. Estimated RAP 3 mmHg. Ascending aortic diameter 3.7 cm  Stress Testing:  Treadmill stress test  [04/13/2014]: Conclusions: Negative for ischemia. Marked decrease in exercise tolerence. The patient exercised according to the Bruce protocol, Total time recorded 3 Min. 42 sec. achieving a max heart rate of 157 which was 101% of MPHR for age and 7.3 METS of work. During exercise there was no  ST-t changes of ischemia. Symptoms: MPHR achieved. Marked dyspnea. Arrhythmia: None. Continue primary prevention. Increase aerobic activity  Heart Catheterization: None  LABORATORY DATA: CBC Latest Ref Rng & Units 07/10/2017 07/27/2016 09/03/2015  WBC 4.0 - 10.5 K/uL 6.3 - 5.5  Hemoglobin 13.0 - 17.0 g/dL 13.2 12.9(L) 12.6(L)  Hematocrit 39.0 - 52.0 % 39.7 38.0(L) 37.0(L)  Platelets 150 - 400 K/uL 277 - 231    CMP Latest Ref Rng & Units 12/29/2019 12/09/2019 07/10/2017  Glucose 65 - 99 mg/dL 81 94 77  BUN 8 - 27 mg/dL 19 19 24(H)  Creatinine 0.76 - 1.27 mg/dL 1.31(H) 1.37(H) 1.29(H)  Sodium 134 - 144 mmol/L 142 141 139  Potassium 3.5 - 5.2 mmol/L 5.2 4.2 4.6  Chloride 96 - 106 mmol/L 105 105 107  CO2 20 - 29 mmol/L 25 23 24   Calcium 8.6 - 10.2 mg/dL 9.9 10.2 10.2  Total Protein 6.0 - 8.3 g/dL - - -  Total Bilirubin 0.3 - 1.2 mg/dL - - -  Alkaline Phos 39 - 117 U/L - - -  AST 0 - 37 U/L - - -  ALT 0 - 53 U/L - - -    Lipid Panel  No results found for: CHOL, TRIG, HDL, CHOLHDL, VLDL, LDLCALC, LDLDIRECT, LABVLDL  No results found for: HGBA1C No components found for: NTPROBNP No results found for: TSH  Cardiac Panel (last 3 results) No results for input(s): CKTOTAL, CKMB, TROPONINIHS, RELINDX in the last 72 hours.  IMPRESSION:    ICD-10-CM   1. Nonrheumatic aortic valve insufficiency  I35.1 EKG 12-Lead    PCV ECHOCARDIOGRAM COMPLETE    2. Essential hypertension  I10     3. Hypercholesterolemia  E78.00        RECOMMENDATIONS: AYDIAN DIMMICK is a 71 y.o. male whose past medical history and cardiovascular risk factors include:  Hypertension with  CKD, prediabetes, hypercholesterolemia, former cigar smoker, history of prostate carcinoma with radical prostatectomy in the past, advanced age.   In the past patient's aortic regurgitation was noted to be at least moderate in severity.  With better blood pressure management and diuresis and improving his modifiable cardiovascular  risk factors his overall severity of AR remains relatively stable.  Independently reviewed echocardiogram from January 2023 with the patient at today's office visit.  Outside labs requested.  No additional changes in medical therapy recommended at this time.  We will repeat an echocardiogram in 1  year.  If that remains stable will space out follow-up studies.  Patient is agreeable with the plan of care.  We will follow-up in 1 year or sooner if change in clinical status.  Total time spent: 20 minutes  FINAL MEDICATION LIST END OF ENCOUNTER: No orders of the defined types were placed in this encounter.   Current Outpatient Medications:    amLODipine (NORVASC) 10 MG tablet, Take 10 mg by mouth every morning. , Disp: , Rfl:    aspirin 325 MG tablet, Take 325 mg by mouth every other day., Disp: , Rfl:    esomeprazole (NEXIUM) 40 MG capsule, Take 40 mg by mouth daily at 12 noon., Disp: , Rfl:    ezetimibe-simvastatin (VYTORIN) 10-40 MG per tablet, Take 1 tablet by mouth every morning. , Disp: , Rfl:    guanFACINE (INTUNIV) 1 MG TB24, Take 1 mg by mouth at bedtime. , Disp: , Rfl:    IRON PO, Take 1 tablet by mouth daily., Disp: , Rfl:    losartan (COZAAR) 100 MG tablet, TAKE 1 TABLET BY MOUTH DAILY, Disp: 90 tablet, Rfl: 1   montelukast (SINGULAIR) 10 MG tablet, Take 10 mg by mouth at bedtime. , Disp: , Rfl:    Multiple Vitamins-Minerals (CENTRUM SILVER PO), Take 1 tablet by mouth daily., Disp: , Rfl:    sildenafil (VIAGRA) 100 MG tablet, Take 100 mg by mouth daily as needed for erectile dysfunction., Disp: , Rfl:    furosemide (LASIX) 20 MG tablet, Take 1 tablet (20 mg total) by mouth daily. (Patient not taking: Reported on 07/20/2021), Disp: , Rfl:   Orders Placed This Encounter  Procedures   EKG 12-Lead   PCV ECHOCARDIOGRAM COMPLETE   --Continue cardiac medications as reconciled in final medication list. --Return in about 1 year (around 07/20/2022) for Follow up aortic regurgitation. Or  sooner if needed. --Continue follow-up with your primary care physician regarding the management of your other chronic comorbid conditions.  Patient's questions and concerns were addressed to his satisfaction. He voices understanding of the instructions provided during this encounter.   This note was created using a voice recognition software as a result there may be grammatical errors inadvertently enclosed that do not reflect the nature of this encounter. Every attempt is made to correct such errors.  Rex Kras, [place], [facility]  Pager: [phone] Office: [phone]

## 2021-09-26 ENCOUNTER — Other Ambulatory Visit: Payer: Self-pay | Admitting: Cardiology

## 2021-10-17 DIAGNOSIS — E782 Mixed hyperlipidemia: Secondary | ICD-10-CM | POA: Diagnosis not present

## 2021-10-17 DIAGNOSIS — I10 Essential (primary) hypertension: Secondary | ICD-10-CM | POA: Diagnosis not present

## 2021-10-17 DIAGNOSIS — M130 Polyarthritis, unspecified: Secondary | ICD-10-CM | POA: Diagnosis not present

## 2021-10-17 DIAGNOSIS — R7309 Other abnormal glucose: Secondary | ICD-10-CM | POA: Diagnosis not present

## 2022-01-17 DIAGNOSIS — E782 Mixed hyperlipidemia: Secondary | ICD-10-CM | POA: Diagnosis not present

## 2022-01-17 DIAGNOSIS — Z6827 Body mass index (BMI) 27.0-27.9, adult: Secondary | ICD-10-CM | POA: Diagnosis not present

## 2022-01-17 DIAGNOSIS — I10 Essential (primary) hypertension: Secondary | ICD-10-CM | POA: Diagnosis not present

## 2022-01-17 DIAGNOSIS — G4730 Sleep apnea, unspecified: Secondary | ICD-10-CM | POA: Diagnosis not present

## 2022-01-17 DIAGNOSIS — M130 Polyarthritis, unspecified: Secondary | ICD-10-CM | POA: Diagnosis not present

## 2022-01-23 ENCOUNTER — Ambulatory Visit
Admission: RE | Admit: 2022-01-23 | Discharge: 2022-01-23 | Disposition: A | Discharge status: Home / Self Care | Payer: BC Managed Care – PPO | Source: Ambulatory Visit | Attending: Family Medicine | Admitting: Family Medicine

## 2022-01-23 ENCOUNTER — Other Ambulatory Visit: Payer: Self-pay | Admitting: Family Medicine

## 2022-01-23 DIAGNOSIS — M130 Polyarthritis, unspecified: Secondary | ICD-10-CM

## 2022-01-23 DIAGNOSIS — M25551 Pain in right hip: Secondary | ICD-10-CM | POA: Diagnosis not present

## 2022-01-23 DIAGNOSIS — M25552 Pain in left hip: Secondary | ICD-10-CM | POA: Diagnosis not present

## 2022-01-23 DIAGNOSIS — M545 Low back pain, unspecified: Secondary | ICD-10-CM | POA: Diagnosis not present

## 2022-03-28 ENCOUNTER — Other Ambulatory Visit: Payer: Self-pay | Admitting: Cardiology

## 2022-05-09 ENCOUNTER — Telehealth: Payer: Self-pay | Admitting: *Deleted

## 2022-05-09 NOTE — Patient Outreach (Signed)
  Care Coordination   05/09/2022 Name: DAMETRI OZBURN MRN: [id number] DOB: [birth year]   Care Coordination Outreach Attempts:  An unsuccessful telephone outreach was attempted today to offer the patient information about available care coordination services as a benefit of their health plan.   Follow Up Plan:  Additional outreach attempts will be made to offer the patient care coordination information and services.   Encounter Outcome:  No Answer   Care Coordination Interventions:  No, not indicated    Eduard Clos MSW, LCSW Licensed Clinical Social Worker      [phone]

## 2022-07-03 ENCOUNTER — Other Ambulatory Visit: Payer: BC Managed Care – PPO

## 2022-07-05 ENCOUNTER — Ambulatory Visit: Payer: BC Managed Care – PPO

## 2022-07-05 DIAGNOSIS — I10 Essential (primary) hypertension: Secondary | ICD-10-CM | POA: Diagnosis not present

## 2022-07-05 DIAGNOSIS — I351 Nonrheumatic aortic (valve) insufficiency: Secondary | ICD-10-CM

## 2022-07-21 ENCOUNTER — Encounter: Payer: Self-pay | Admitting: Cardiology

## 2022-07-21 ENCOUNTER — Ambulatory Visit: Payer: BC Managed Care – PPO | Admitting: Cardiology

## 2022-07-21 VITALS — BP 159/90 | HR 60 | Resp 16 | Ht 68.0 in | Wt 186.6 lb

## 2022-07-21 DIAGNOSIS — I351 Nonrheumatic aortic (valve) insufficiency: Secondary | ICD-10-CM

## 2022-07-21 DIAGNOSIS — I10 Essential (primary) hypertension: Secondary | ICD-10-CM

## 2022-07-21 DIAGNOSIS — E7800 Pure hypercholesterolemia, unspecified: Secondary | ICD-10-CM | POA: Diagnosis not present

## 2022-07-21 MED ORDER — HYDROCHLOROTHIAZIDE 25 MG PO TABS: 25.0000 mg | ORAL_TABLET | Freq: Every morning | ORAL | 0 refills | Status: DC

## 2022-07-21 NOTE — Progress Notes (Signed)
Keith Webb Date of Birth: 02/21/1951 MRN: [id number] Primary Care Provider:Bland, Myra Rude, MD Former Cardiology Providers: Dr. Vear Clock Primary Cardiologist: Rex Kras, DO, [facility] (established care 11/27/2019)  Date: 07/21/22 Last Office Visit: 07/20/2021  Chief Complaint  Patient presents with   aortic regurgitation   Follow-up    1 year    HPI  Keith Webb is a 72 y.o.  male whose past medical history and cardiovascular risk factors include: Hypertension with  CKD, prediabetes, hypercholesterolemia, former cigar smoker, history of prostate carcinoma with radical prostatectomy in the past, advanced age.   Patient presents today for 1 year follow-up visit given his underlying aortic regurgitation.  Clinically he is asymptomatic with regards to no chest pain or heart failure symptoms.  He does check his blood pressures at home but does not recall his readings and he does not write them down.  Most recent echocardiogram results reviewed and noted below but in summary severity of AR remains relatively stable and proximal ascending aorta measures 39 mm.  Overall functional capacity has reduced due to busy lifestyle.  He only walks 20 to 30 minutes a couple days a week.  Prior to that he was walking 2 to 3 miles at least 3 days a week.  He plans to start ambulating more once the weather is warmer.  ALLERGIES: No Known Allergies   MEDICATION LIST PRIOR TO VISIT: Current Outpatient Medications on File Prior to Visit  Medication Sig Dispense Refill   amLODipine (NORVASC) 10 MG tablet Take 10 mg by mouth every morning.      Aspirin 81 MG CAPS Take 81 mg by mouth once.     celecoxib (CELEBREX) 200 MG capsule Take 200 mg by mouth 2 (two) times daily.     clotrimazole-betamethasone (LOTRISONE) cream Apply 1 Application topically 2 (two) times daily.     esomeprazole (NEXIUM) 40 MG capsule Take 40 mg by mouth daily at 12 noon.     ezetimibe-simvastatin (VYTORIN) 10-40 MG per  tablet Take 1 tablet by mouth every morning.      guanFACINE (INTUNIV) 1 MG TB24 Take 1 mg by mouth at bedtime.      IRON PO Take 1 tablet by mouth daily.     losartan (COZAAR) 100 MG tablet TAKE 1 TABLET BY MOUTH DAILY 90 tablet 1   montelukast (SINGULAIR) 10 MG tablet Take 10 mg by mouth at bedtime.      Multiple Vitamins-Minerals (CENTRUM SILVER PO) Take 1 tablet by mouth daily.     rosuvastatin (CRESTOR) 10 MG tablet Take 10 mg by mouth at bedtime.     sildenafil (VIAGRA) 100 MG tablet Take 100 mg by mouth daily as needed for erectile dysfunction.     No current facility-administered medications on file prior to visit.    PAST MEDICAL HISTORY: Past Medical History:  Diagnosis Date   Arthritis    Environmental and seasonal allergies    GERD (gastroesophageal reflux disease)    Heart murmur    History of syncope    2006  orthostatic hypotension w/ dehydration   Hypercholesterolemia    Hypertension    OSA (obstructive sleep apnea)    per pt used cpap for awhile but stopped because  didn't think he needed any more   Recurrent prostate cancer [facility]) urologist-  dr Mertie Moores  oncologist-- dr Valere Dross   first dx 1998 (Gleason 7,  PSA 18.5) s/p  radical prostatectomy---  recurrent 10/ 2013  w/ salvage external radiation 06-24-2014 to  08-10-2014/  per pt (07-21-2016)  last PSA <5   S/P radiation therapy 06/24/2014 through 08/10/2014                                                     External beam of Prostate bed 6600 cGy in 33 sessions                          Wears glasses     PAST SURGICAL HISTORY: Past Surgical History:  Procedure Laterality Date   LAMINECTOMY WITH POSTERIOR LATERAL ARTHRODESIS LEVEL 2 N/A 07/13/2017   Procedure: Laminectomy Lumbar Two-Three, Posterior Lateral Fusion - Lumbar One-Two-Lumbar Two-Three, extension of pedicle screw fixation  -Lumbar One-Lumbar Five;  Surgeon: Eustace Moore, MD;  Location: [place];  Service: Neurosurgery;  Laterality: N/A;   POSTERIOR  LAMINECTOMY / DECOMPRESSION LUMBAR SPINE  12/09/2009   W/  DECOMPRESSION AND FUSION L5--S1/  REMOVAL EPIDURAL SYNOVIAL CYST (per path no neoplasm present)   POSTERIOR LAMINECTOMY / DECOMPRESSION LUMBAR SPINE  09/10/2015   decompressive laminectomy L4-5 left/  fixation and arthrodesis L3-5 /  removal hardware L5-S1   PROSTATECTOMY  04/01/1997   radical w/ bilateral pelvic lymph node dissection   RECTAL EXAM UNDER ANESTHESIA N/A 07/27/2016   Procedure: RECTAL EXAM UNDER ANESTHESIA;  Surgeon: Michael Boston, MD;  Location: [place];  Service: General;  Laterality: N/A;  Hemorrhoidal ligation, suture pexy   RIGHT INDEX FINGER SYNOVECTOMY/ EXCISION GIANT CELL TUMOR  02/19/2001   TOTAL HIP ARTHROPLASTY Left 12/08/2002   TRANSTHORACIC ECHOCARDIOGRAM  01/03/2005   ef 55-65%/  trivial AR and MR    FAMILY HISTORY: The patient's family history includes Aneurysm in his sister; Cancer in his brother; Diabetes in his sister; Other in his brother; Stroke in his mother.   SOCIAL HISTORY:  The patient  reports that he quit smoking about 18 years ago. His smoking use included cigars. He has never used smokeless tobacco. He reports that he does not currently use drugs after having used the following drugs: Marijuana and Cocaine. He reports that he does not drink alcohol.  Review of Systems  Cardiovascular:  Negative for chest pain, claudication, dyspnea on exertion, irregular heartbeat, leg swelling, near-syncope, orthopnea, palpitations, paroxysmal nocturnal dyspnea and syncope.  Respiratory:  Negative for shortness of breath.   Hematologic/Lymphatic: Negative for bleeding problem.  Musculoskeletal:  Negative for muscle cramps and myalgias.  Neurological:  Negative for dizziness and light-headedness.    PHYSICAL EXAM:    07/21/2022    9:46 AM 07/20/2021   11:55 AM 01/06/2021   10:07 AM  Vitals with BMI  Height 5' 8"$  5' 8"$  5' 8"$   Weight 186 lbs 10 oz 189 lbs 185 lbs  BMI 28.38 [id number]   Systolic [id number]  Diastolic 90 82 87  Pulse 60 84 89   Physical Exam  Constitutional: No distress.  Age appropriate, hemodynamically stable.   Neck: No JVD present.  Cardiovascular: Normal rate, regular rhythm, S1 normal, S2 normal, intact distal pulses and normal pulses. Exam reveals no gallop, no S3 and no S4.  Murmur heard. Decrescendo early diastolic murmur is present with a grade of 3/4 at the upper right sternal border. Pulmonary/Chest: Effort normal and breath sounds normal. No stridor. He has no wheezes. He has no rales.  Abdominal: Soft. Bowel sounds are normal. He exhibits no distension. There is no abdominal tenderness.  Musculoskeletal:        General: No edema.     Cervical back: Neck supple.  Neurological: He is alert and oriented to person, place, and time. He has intact cranial nerves (2-12).  Skin: Skin is warm and moist.   CARDIAC DATABASE: EKG: 07/21/2022: Sinus bradycardia, 59 bpm, without underlying injury pattern.  Echocardiogram: 07/08/2021: LVEF 55 to 60%, no regional wall motion modalities, mild LVH, indeterminate diastolic function, normal right ventricular size and function, mild to moderate aortic regurgitation, aortic valve sclerosis without stenosis. Estimated RAP 3 mmHg. Ascending aortic diameter 3.7 cm  07/05/2022:  Normal LV systolic function with visual EF 60-65%. Left ventricle cavity is normal in size. Normal left ventricular wall thickness. Normal global wall motion. Normal diastolic filling pattern, normal LAP.  Mild to moderate aortic regurgitation.  The aortic root is normal. Proximal ascending aorta dilated, 39 mm.  Compared to 07/08/2021 AR severity remains stable and proximal ascending aorta measured 37 mm.    Stress Testing:  Treadmill stress test  [04/13/2014]: Conclusions: Negative for ischemia. Marked decrease in exercise tolerence. The patient exercised according to the Bruce protocol, Total time recorded 3 Min. 42 sec. achieving a  max heart rate of 157 which was 101% of MPHR for age and 7.3 METS of work. During exercise there was no ST-t changes of ischemia. Symptoms: MPHR achieved. Marked dyspnea. Arrhythmia: None. Continue primary prevention. Increase aerobic activity  Heart Catheterization: None  LABORATORY DATA:    Latest Ref Rng & Units 07/10/2017    1:38 PM 07/27/2016    7:25 AM 09/03/2015    2:50 PM  CBC  WBC 4.0 - 10.5 K/uL 6.3   5.5   Hemoglobin 13.0 - 17.0 g/dL 13.2  12.9  12.6   Hematocrit 39.0 - 52.0 % 39.7  38.0  37.0   Platelets 150 - 400 K/uL 277   231        Latest Ref Rng & Units 12/29/2019    5:00 PM 12/09/2019   10:11 AM 07/10/2017    1:38 PM  CMP  Glucose 65 - 99 mg/dL 81  94  77   BUN 8 - 27 mg/dL 19  19  24   $ Creatinine 0.76 - 1.27 mg/dL 1.31  1.37  1.29   Sodium 134 - 144 mmol/L 142  141  139   Potassium 3.5 - 5.2 mmol/L 5.2  4.2  4.6   Chloride 96 - 106 mmol/L 105  105  107   CO2 20 - 29 mmol/L 25  23  24   $ Calcium 8.6 - 10.2 mg/dL 9.9  10.2  10.2     Lipid Panel  No results found for: "CHOL", "TRIG", "HDL", "CHOLHDL", "VLDL", "LDLCALC", "LDLDIRECT", "LABVLDL"  No results found for: "HGBA1C" No components found for: "NTPROBNP" No results found for: "TSH"  Cardiac Panel (last 3 results) No results for input(s): "CKTOTAL", "CKMB", "TROPONINIHS", "RELINDX" in the last 72 hours.  IMPRESSION:    ICD-10-CM   1. Nonrheumatic aortic valve insufficiency  I35.1 EKG 12-Lead    2. Essential hypertension  I10 hydrochlorothiazide (HYDRODIURIL) 25 MG tablet    Basic metabolic panel    Magnesium    3. Hypercholesterolemia  E78.00        RECOMMENDATIONS: Keith Webb is a 72 y.o. male whose past medical history and cardiovascular risk factors include:  Hypertension with  CKD, prediabetes, hypercholesterolemia, former cigar  smoker, history of prostate carcinoma with radical prostatectomy in the past, advanced age.   Patient presents today for 1 year follow-up visit for evaluation  and management of aortic regurgitation.  Echocardiogram from January 2024 notes mild/moderate aortic regurgitation with proximal ascending aorta 38 mm.  Patient is currently asymptomatic.  He does not check his blood pressures at home regularly.  Asked him to start checking his blood pressures at home.  Goal SBP of 130 mmHg.  Will start hydrochlorothiazide 25 mg p.o. every morning.  Labs in 1 week to evaluate kidney function and electrolytes.  His current antihypertensive medications include amlodipine and losartan.  He currently takes amlodipine in the morning.  I have asked him to take losartan at night.  I will continue to follow him on an annual basis.  The last 2 echocardiograms have noted the severity to be mild/moderate and therefore we will hold off on repeat echo for now.  FINAL MEDICATION LIST END OF ENCOUNTER: Meds ordered this encounter  Medications   hydrochlorothiazide (HYDRODIURIL) 25 MG tablet    Sig: Take 1 tablet (25 mg total) by mouth every morning.    Dispense:  90 tablet    Refill:  0    Current Outpatient Medications:    amLODipine (NORVASC) 10 MG tablet, Take 10 mg by mouth every morning. , Disp: , Rfl:    Aspirin 81 MG CAPS, Take 81 mg by mouth once., Disp: , Rfl:    celecoxib (CELEBREX) 200 MG capsule, Take 200 mg by mouth 2 (two) times daily., Disp: , Rfl:    clotrimazole-betamethasone (LOTRISONE) cream, Apply 1 Application topically 2 (two) times daily., Disp: , Rfl:    esomeprazole (NEXIUM) 40 MG capsule, Take 40 mg by mouth daily at 12 noon., Disp: , Rfl:    ezetimibe-simvastatin (VYTORIN) 10-40 MG per tablet, Take 1 tablet by mouth every morning. , Disp: , Rfl:    guanFACINE (INTUNIV) 1 MG TB24, Take 1 mg by mouth at bedtime. , Disp: , Rfl:    hydrochlorothiazide (HYDRODIURIL) 25 MG tablet, Take 1 tablet (25 mg total) by mouth every morning., Disp: 90 tablet, Rfl: 0   IRON PO, Take 1 tablet by mouth daily., Disp: , Rfl:    losartan (COZAAR) 100 MG tablet,  TAKE 1 TABLET BY MOUTH DAILY, Disp: 90 tablet, Rfl: 1   montelukast (SINGULAIR) 10 MG tablet, Take 10 mg by mouth at bedtime. , Disp: , Rfl:    Multiple Vitamins-Minerals (CENTRUM SILVER PO), Take 1 tablet by mouth daily., Disp: , Rfl:    rosuvastatin (CRESTOR) 10 MG tablet, Take 10 mg by mouth at bedtime., Disp: , Rfl:    sildenafil (VIAGRA) 100 MG tablet, Take 100 mg by mouth daily as needed for erectile dysfunction., Disp: , Rfl:   Orders Placed This Encounter  Procedures   Basic metabolic panel   Magnesium   EKG 12-Lead   --Continue cardiac medications as reconciled in final medication list. --Return in about 1 year (around 07/22/2023) for Annual follow up visit AR and BP. Or sooner if needed. --Continue follow-up with your primary care physician regarding the management of your other chronic comorbid conditions.  Patient's questions and concerns were addressed to his satisfaction. He voices understanding of the instructions provided during this encounter.   This note was created using a voice recognition software as a result there may be grammatical errors inadvertently enclosed that do not reflect the nature of this encounter. Every attempt is made to correct such errors.  Rex Kras, [place], [facility]  Pager: [phone] Office: [phone]

## 2022-08-01 DIAGNOSIS — M25562 Pain in left knee: Secondary | ICD-10-CM | POA: Diagnosis not present

## 2022-08-07 DIAGNOSIS — M25562 Pain in left knee: Secondary | ICD-10-CM | POA: Diagnosis not present

## 2022-09-25 ENCOUNTER — Other Ambulatory Visit: Payer: Self-pay | Admitting: Cardiology

## 2022-10-06 DIAGNOSIS — G4733 Obstructive sleep apnea (adult) (pediatric): Secondary | ICD-10-CM | POA: Diagnosis not present

## 2022-10-16 ENCOUNTER — Other Ambulatory Visit: Payer: Self-pay | Admitting: Cardiology

## 2022-10-16 DIAGNOSIS — I10 Essential (primary) hypertension: Secondary | ICD-10-CM

## 2022-10-26 ENCOUNTER — Other Ambulatory Visit: Payer: Self-pay

## 2022-10-26 ENCOUNTER — Observation Stay (HOSPITAL_COMMUNITY)
Admission: EM | Admit: 2022-10-26 | Discharge: 2022-10-27 | Disposition: A | Discharge status: Home / Self Care | Payer: BC Managed Care – PPO | Attending: Internal Medicine | Admitting: Internal Medicine

## 2022-10-26 ENCOUNTER — Emergency Department (HOSPITAL_COMMUNITY): Payer: BC Managed Care – PPO

## 2022-10-26 ENCOUNTER — Encounter (HOSPITAL_COMMUNITY): Payer: Self-pay

## 2022-10-26 DIAGNOSIS — Z79899 Other long term (current) drug therapy: Secondary | ICD-10-CM | POA: Insufficient documentation

## 2022-10-26 DIAGNOSIS — Z87891 Personal history of nicotine dependence: Secondary | ICD-10-CM | POA: Diagnosis not present

## 2022-10-26 DIAGNOSIS — Z9889 Other specified postprocedural states: Secondary | ICD-10-CM | POA: Diagnosis not present

## 2022-10-26 DIAGNOSIS — I129 Hypertensive chronic kidney disease with stage 1 through stage 4 chronic kidney disease, or unspecified chronic kidney disease: Secondary | ICD-10-CM | POA: Insufficient documentation

## 2022-10-26 DIAGNOSIS — E785 Hyperlipidemia, unspecified: Secondary | ICD-10-CM | POA: Insufficient documentation

## 2022-10-26 DIAGNOSIS — N1831 Chronic kidney disease, stage 3a: Secondary | ICD-10-CM | POA: Diagnosis not present

## 2022-10-26 DIAGNOSIS — R42 Dizziness and giddiness: Secondary | ICD-10-CM | POA: Diagnosis not present

## 2022-10-26 DIAGNOSIS — Z8546 Personal history of malignant neoplasm of prostate: Secondary | ICD-10-CM | POA: Insufficient documentation

## 2022-10-26 DIAGNOSIS — Z96642 Presence of left artificial hip joint: Secondary | ICD-10-CM | POA: Insufficient documentation

## 2022-10-26 DIAGNOSIS — R110 Nausea: Secondary | ICD-10-CM | POA: Diagnosis not present

## 2022-10-26 DIAGNOSIS — R55 Syncope and collapse: Secondary | ICD-10-CM | POA: Diagnosis not present

## 2022-10-26 DIAGNOSIS — I10 Essential (primary) hypertension: Secondary | ICD-10-CM

## 2022-10-26 DIAGNOSIS — Z7982 Long term (current) use of aspirin: Secondary | ICD-10-CM | POA: Insufficient documentation

## 2022-10-26 DIAGNOSIS — R531 Weakness: Secondary | ICD-10-CM | POA: Diagnosis not present

## 2022-10-26 LAB — CBC
HCT: 38.1 % — ABNORMAL LOW (ref 39.0–52.0)
Hemoglobin: 12.5 g/dL — ABNORMAL LOW (ref 13.0–17.0)
MCH: 30.6 pg (ref 26.0–34.0)
MCHC: 32.8 g/dL (ref 30.0–36.0)
MCV: 93.2 fL (ref 80.0–100.0)
Platelets: 234 10*3/uL (ref 150–400)
RBC: 4.09 MIL/uL — ABNORMAL LOW (ref 4.22–5.81)
RDW: 13.2 % (ref 11.5–15.5)
WBC: 6 10*3/uL (ref 4.0–10.5)
nRBC: 0 % (ref 0.0–0.2)

## 2022-10-26 LAB — TROPONIN I (HIGH SENSITIVITY)
Troponin I (High Sensitivity): 6 ng/L (ref <18)
Troponin I (High Sensitivity): 6 ng/L (ref <18)

## 2022-10-26 LAB — BASIC METABOLIC PANEL
Anion gap: 8 (ref 5–15)
BUN: 22 mg/dL (ref 8–23)
CO2: 23 mmol/L (ref 22–32)
Calcium: 9.8 mg/dL (ref 8.9–10.3)
Chloride: 108 mmol/L (ref 98–111)
Creatinine, Ser: 1.47 mg/dL — ABNORMAL HIGH (ref 0.61–1.24)
GFR, Estimated: 50 mL/min — ABNORMAL LOW (ref >60)
Glucose, Bld: 114 mg/dL — ABNORMAL HIGH (ref 70–99)
Potassium: 3.5 mmol/L (ref 3.5–5.1)
Sodium: 139 mmol/L (ref 135–145)

## 2022-10-26 MED ORDER — POLYETHYLENE GLYCOL 3350 17 G PO PACK: 17.0000 g | PACK | Freq: Every day | ORAL | Status: DC | PRN

## 2022-10-26 MED ORDER — ACETAMINOPHEN 325 MG PO TABS: 650.0000 mg | ORAL_TABLET | Freq: Four times a day (QID) | ORAL | Status: DC | PRN

## 2022-10-26 MED ORDER — PROCHLORPERAZINE EDISYLATE 10 MG/2ML IJ SOLN: 5.0000 mg | Freq: Four times a day (QID) | INTRAMUSCULAR | Status: DC | PRN

## 2022-10-26 MED ORDER — MELATONIN 3 MG PO TABS: 3.0000 mg | ORAL_TABLET | Freq: Every evening | ORAL | Status: DC | PRN

## 2022-10-26 MED ORDER — ASPIRIN 81 MG PO TBEC
81.0000 mg | DELAYED_RELEASE_TABLET | Freq: Once | ORAL | Status: AC
  Administered 2022-10-26: 81 mg via ORAL
  Filled 2022-10-26: qty 1

## 2022-10-26 MED ORDER — PANTOPRAZOLE SODIUM 40 MG PO TBEC
40.0000 mg | DELAYED_RELEASE_TABLET | Freq: Every day | ORAL | Status: DC
  Administered 2022-10-27: 40 mg via ORAL
  Filled 2022-10-26: qty 1

## 2022-10-26 MED ORDER — MONTELUKAST SODIUM 10 MG PO TABS
10.0000 mg | ORAL_TABLET | Freq: Every day | ORAL | Status: DC
  Administered 2022-10-26: 10 mg via ORAL
  Filled 2022-10-26: qty 1

## 2022-10-26 MED ORDER — ENOXAPARIN SODIUM 40 MG/0.4ML IJ SOSY
40.0000 mg | PREFILLED_SYRINGE | INTRAMUSCULAR | Status: DC
Start: 2022-10-26 — End: 2022-10-27
  Administered 2022-10-26: 40 mg via SUBCUTANEOUS
  Filled 2022-10-26: qty 0.4

## 2022-10-26 MED ORDER — EZETIMIBE-SIMVASTATIN 10-40 MG PO TABS
1.0000 | ORAL_TABLET | Freq: Every morning | ORAL | Status: DC
  Filled 2022-10-26: qty 1

## 2022-10-26 MED ORDER — LACTATED RINGERS IV SOLN: INTRAVENOUS | Status: DC

## 2022-10-26 NOTE — ED Provider Triage Note (Signed)
Emergency Medicine Provider Triage Evaluation Note  Keith Webb , a 72 y.o. male  was evaluated in triage.  Pt complains of syncope. States that he was outside weed eating and suddenly felt weak and nauseated. He stopped working and sat down on a bench and subsequently had a syncopal episode witnessed by his wife. He did not fall. States he felt a little short of breath when he regained consciousness.  Denies any symptoms currently.  Denies any chest pain.  Review of Systems  Positive:  Negative:   Physical Exam  BP 130/89   Pulse 69   Temp 98 F (36.7 C) (Oral)   Resp 18   Ht 5\' 8"  (1.727 m)   Wt 84.6 kg   SpO2 96%   BMI 28.36 kg/m  Gen:   Awake, no distress   Resp:  Normal effort  MSK:   Moves extremities without difficulty  Other:    Medical Decision Making  Medically screening exam initiated at 2:48 PM.  Appropriate orders placed.  MEDARD BERO was informed that the remainder of the evaluation will be completed by another provider, this initial triage assessment does not replace that evaluation, and the importance of remaining in the ED until their evaluation is complete.     Silva Bandy, PA-C 10/26/22 1450

## 2022-10-26 NOTE — ED Notes (Signed)
Fluids paused while pt eating

## 2022-10-26 NOTE — ED Notes (Signed)
ED TO INPATIENT HANDOFF REPORT  ED Nurse Name and Phone #: Jodie Echevaria [phone]  S Name/Age/Gender Keith Webb 72 y.o. male Room/Bed: 024C/024C  Code Status   Code Status: Full Code  Home/SNF/Other Home Patient oriented to: self, place, time, and situation Is this baseline? Yes   Triage Complete: Triage complete  Chief Complaint Syncope [R55]  Triage Note Pt arrived via [facility] from home. Pt was doing yard work then went inside to eat a snack. Pt went back outside and started to feel nauseated and weak. Pt sat on a bench and had a witness syncopal episode by wife who lowered him to the ground. Per EMS initial bp 70 palp. Pt c/o SOB and dizziness, but denies chest pain. EMS gave NS 500 mL. Pt is eupneic.     Allergies No Known Allergies  Level of Care/Admitting Diagnosis ED Disposition     ED Disposition  Admit   Condition  --   Comment  Hospital Area: [facility] [[id number]]  Level of Care: Telemetry Cardiac [103]  May place patient in observation at [facility] or [facility] if equivalent level of care is available:: No  Covid Evaluation: Asymptomatic - no recent exposure (last 10 days) testing not required  Diagnosis: Syncope [206001]  Admitting Physician: Darlin Drop [[id number]]  Attending Physician: Darlin Drop [[id number]]          B Medical/Surgery History Past Medical History:  Diagnosis Date   Arthritis    Environmental and seasonal allergies    GERD (gastroesophageal reflux disease)    Heart murmur    History of syncope    2006  orthostatic hypotension w/ dehydration   Hypercholesterolemia    Hypertension    OSA (obstructive sleep apnea)    per pt used cpap for awhile but stopped because  didn't think he needed any more   Recurrent prostate cancer [facility]) urologist-  dr Lanelle Bal  oncologist-- dr Dayton Scrape   first dx 1998 (Gleason 7,  PSA 18.5) s/p  radical prostatectomy---  recurrent 10/ 2013  w/ salvage external radiation 06-24-2014 to  08-10-2014/  per pt (07-21-2016)  last PSA <5   S/P radiation therapy 06/24/2014 through 08/10/2014                                                     External beam of Prostate bed 6600 cGy in 33 sessions                          Wears glasses    Past Surgical History:  Procedure Laterality Date   LAMINECTOMY WITH POSTERIOR LATERAL ARTHRODESIS LEVEL 2 N/A 07/13/2017   Procedure: Laminectomy Lumbar Two-Three, Posterior Lateral Fusion - Lumbar One-Two-Lumbar Two-Three, extension of pedicle screw fixation  -Lumbar One-Lumbar Five;  Surgeon: Tia Alert, MD;  Location: [facility] OR;  Service: Neurosurgery;  Laterality: N/A;   POSTERIOR LAMINECTOMY / DECOMPRESSION LUMBAR SPINE  12/09/2009   W/  DECOMPRESSION AND FUSION L5--S1/  REMOVAL EPIDURAL SYNOVIAL CYST (per path no neoplasm present)   POSTERIOR LAMINECTOMY / DECOMPRESSION LUMBAR SPINE  09/10/2015   decompressive laminectomy L4-5 left/  fixation and arthrodesis L3-5 /  removal hardware L5-S1   PROSTATECTOMY  04/01/1997   radical w/ bilateral pelvic lymph node dissection   RECTAL EXAM UNDER ANESTHESIA N/A  07/27/2016   Procedure: RECTAL EXAM UNDER ANESTHESIA;  Surgeon: Karie Soda, MD;  Location: [facility] ;  Service: General;  Laterality: N/A;  Hemorrhoidal ligation, suture pexy   RIGHT INDEX FINGER SYNOVECTOMY/ EXCISION GIANT CELL TUMOR  02/19/2001   TOTAL HIP ARTHROPLASTY Left 12/08/2002   TRANSTHORACIC ECHOCARDIOGRAM  01/03/2005   ef 55-65%/  trivial AR and MR     A IV Location/Drains/Wounds Patient Lines/Drains/Airways Status     Active Line/Drains/Airways     Name Placement date Placement time Site Days   Peripheral IV 10/26/22 18 G Right Antecubital 10/26/22  --  Antecubital  less than 1   Incision (Closed) 07/27/16 Other (Comment) Other (Comment) 07/27/16  0810  -- 2282   Incision (Closed) 07/13/17 Back Other (Comment) 07/13/17  1839  -- 1931            Intake/Output Last 24 hours No intake or output data in  the 24 hours ending 10/26/22 2115  Labs/Imaging Results for orders placed or performed during the hospital encounter of 10/26/22 (from the past 48 hour(s))  Basic metabolic panel     Status: Abnormal   Collection Time: 10/26/22  2:35 PM  Result Value Ref Range   Sodium 139 135 - 145 mmol/L   Potassium 3.5 3.5 - 5.1 mmol/L   Chloride 108 98 - 111 mmol/L   CO2 23 22 - 32 mmol/L   Glucose, Bld 114 (H) 70 - 99 mg/dL    Comment: Glucose reference range applies only to samples taken after fasting for at least 8 hours.   BUN 22 8 - 23 mg/dL   Creatinine, Ser 1.61 (H) 0.61 - 1.24 mg/dL   Calcium 9.8 8.9 - 09.6 mg/dL   GFR, Estimated 50 (L) >60 mL/min    Comment: (NOTE) Calculated using the CKD-EPI Creatinine Equation (2021)    Anion gap 8 5 - 15    Comment: Performed at [facility], [address] [place], [place] [address]  CBC     Status: Abnormal   Collection Time: 10/26/22  2:35 PM  Result Value Ref Range   WBC 6.0 4.0 - 10.5 K/uL   RBC 4.09 (L) 4.22 - 5.81 MIL/uL   Hemoglobin 12.5 (L) 13.0 - 17.0 g/dL   HCT 98.1 (L) 19.1 - 47.8 %   MCV 93.2 80.0 - 100.0 fL   MCH 30.6 26.0 - 34.0 pg   MCHC 32.8 30.0 - 36.0 g/dL   RDW 29.5 62.1 - 30.8 %   Platelets 234 150 - 400 K/uL   nRBC 0.0 0.0 - 0.2 %    Comment: Performed at [facility], [address], [place], [place] [address]  Troponin I (High Sensitivity)     Status: None   Collection Time: 10/26/22  2:35 PM  Result Value Ref Range   Troponin I (High Sensitivity) 6 <18 ng/L    Comment: (NOTE) Elevated high sensitivity troponin I (hsTnI) values and significant  changes across serial measurements may suggest ACS but many other  chronic and acute conditions are known to elevate hsTnI results.  Refer to the "Links" section for chest pain algorithms and additional  guidance. Performed at [facility], [address] [place], [place] [address]   Troponin I (High Sensitivity)     Status: None   Collection Time:  10/26/22  5:17 PM  Result Value Ref Range   Troponin I (High Sensitivity) 6 <18 ng/L    Comment: (NOTE) Elevated high  sensitivity troponin I (hsTnI) values and significant  changes across serial measurements may suggest ACS but many other  chronic and acute conditions are known to elevate hsTnI results.  Refer to the "Links" section for chest pain algorithms and additional  guidance. Performed at [facility], [address] [place], [place] [address]    DG Chest 2 View  Result Date: 10/26/2022 CLINICAL DATA:  Syncope EXAM: CHEST - 2 VIEW COMPARISON:  09/03/2015 FINDINGS: Cardiac size is within normal limits. Prominence of the pulmonary conus has not changed. There are no signs of pulmonary edema or focal pulmonary consolidation. There is no pleural effusion or pneumothorax. In the PA view, there is faint 8 mm nodular density in left lower lung field. There is surgical fusion in the lumbar spine. IMPRESSION: There are no signs of pulmonary edema or focal pulmonary consolidation. There is faint 8 mm nodular density in left lower lung field which may represent the nipple shadow or a new lung nodule such as granuloma or neoplasm. Repeat PA view with nipple markers may be considered. Electronically Signed   By: Ernie Avena M.D.   On: 10/26/2022 15:02    Pending Labs Unresulted Labs (From admission, onward)     Start     Ordered   11/02/22 0500  Creatinine, serum  (enoxaparin (LOVENOX)    CrCl >/= 30 ml/min)  Weekly,   R     Comments: while on enoxaparin therapy    10/26/22 2059   10/27/22 0500  CBC  Tomorrow morning,   R        10/26/22 2111   10/27/22 0500  Comprehensive metabolic panel  Tomorrow morning,   R        10/26/22 2111   10/27/22 0500  Magnesium  Tomorrow morning,   R        10/26/22 2111   10/27/22 0500  Phosphorus  Tomorrow morning,   R        10/26/22 2111            Vitals/Pain Today's Vitals   10/26/22 1818 10/26/22 2039 10/26/22 2100 10/26/22  2114  BP: (!) 135/95 (!) 132/90 (!) 151/83   Pulse: 61 (!) 59    Resp: 16 15 17    Temp: 98.5 F (36.9 C) 98.3 F (36.8 C)    TempSrc:  Oral    SpO2: 100% 100% 92% 100%  Weight:      Height:      PainSc:  0-No pain      Isolation Precautions No active isolations  Medications Medications  enoxaparin (LOVENOX) injection 40 mg (has no administration in time range)  acetaminophen (TYLENOL) tablet 650 mg (has no administration in time range)  polyethylene glycol (MIRALAX / GLYCOLAX) packet 17 g (has no administration in time range)  melatonin tablet 3 mg (has no administration in time range)  prochlorperazine (COMPAZINE) injection 5 mg (has no administration in time range)    Mobility walks     Focused Assessments Cardiac Assessment Handoff:  Cardiac Rhythm: Normal sinus rhythm No results found for: "CKTOTAL", "CKMB", "CKMBINDEX", "TROPONINI" No results found for: "DDIMER" Does the Patient currently have chest pain? No   , Neuro Assessment Handoff:  Swallow screen pass? Yes  Cardiac Rhythm: Normal sinus rhythm       Neuro Assessment:   Neuro Checks:      Has TPA been given? No If patient is a Neuro Trauma and patient is going to OR before floor call report  to 4N Charge nurse: [phone] or [phone]  , Pulmonary Assessment Handoff:  Lung sounds: Bilateral Breath Sounds: Clear O2 Device: Room Air      R Recommendations: See Admitting Provider Note  Report given to:   Additional Notes: n/a

## 2022-10-26 NOTE — ED Triage Notes (Addendum)
Pt arrived via [facility] from home. Pt was doing yard work then went inside to eat a snack. Pt went back outside and started to feel nauseated and weak. Pt sat on a bench and had a witness syncopal episode by wife who lowered him to the ground. Per EMS initial bp 70 palp. Pt c/o SOB and dizziness, but denies chest pain. EMS gave NS 500 mL. Pt is eupneic.

## 2022-10-26 NOTE — ED Notes (Signed)
Physical EKG in [place] box

## 2022-10-26 NOTE — ED Provider Notes (Signed)
[place] [facility] AT [facility] Provider Note   CSN: [id number] Arrival date & time: 10/26/22  1423     History  Chief Complaint  Patient presents with   Loss of Consciousness    Keith Webb is a 72 y.o. male.  72 year old male history of GERD, hypertension, hyperlipidemia, prior prostate cancer status post radiation therapy presenting for syncope.  Patient states he is doing well today.  He went for a long walk, did not anything for that before this.  He then went out to use his weedeater.  He had applied, granola bar, and some water.  He was out using his weedeater.  He developed left lower quadrant abdominal pain.  He noted this to his wife who is with him.  He then became dizzy and then passed out.  Wife helped him get to the ground so he did not hit his head.  He was unconscious for about 20 seconds, wife did a brief round of CPR for approximately 8 compressions, he woke up with this.  No seizure-like activity, no postictal period.  Currently feels well.  This occurred several hours ago.  He never had any chest pain and currently is asymptomatic.  No chest pain, dizziness, palpitations, abdominal pain.  Has been eating and drinking well, no vomiting or diarrhea.  No fevers or chills or recent illness.  Wife did not check his pulse when he was unconscious, she is not sure that he was in cardiac arrest.  Unsure if he was breathing.  He has not had any difficulty breathing throughout all of this.  With EMS he was initially hypotensive but responded to fluids.  Blood sugar was normal.  Has not had any changes medication since starting hydrochlorothiazide several months ago.   Loss of Consciousness      Home Medications Prior to Admission medications   Medication Sig Start Date End Date Taking? Authorizing Provider  clotrimazole-betamethasone (LOTRISONE) cream Apply 1 Application topically 2 (two) times daily. 02/21/22  Yes [provider]   esomeprazole (NEXIUM) 40 MG capsule Take 40 mg by mouth daily at 12 noon.   Yes [provider]  ezetimibe-simvastatin (VYTORIN) 10-40 MG per tablet Take 1 tablet by mouth every morning.    Yes [provider]  guanFACINE (INTUNIV) 1 MG TB24 Take 1 mg by mouth at bedtime.    Yes [provider]  IRON PO Take 1 tablet by mouth daily.   Yes [provider]  losartan (COZAAR) 100 MG tablet TAKE 1 TABLET BY MOUTH DAILY Patient taking differently: Take 100 mg by mouth daily. 09/25/22  Yes Tolia, Sunit, DO  montelukast (SINGULAIR) 10 MG tablet Take 10 mg by mouth at bedtime.   Yes [provider]  Multiple Vitamins-Minerals (CENTRUM SILVER PO) Take 1 tablet by mouth daily.   Yes [provider]  rosuvastatin (CRESTOR) 10 MG tablet Take 10 mg by mouth daily. 07/17/22  Yes [provider]  sildenafil (VIAGRA) 100 MG tablet Take 100 mg by mouth daily as needed for erectile dysfunction.   Yes [provider]  amLODipine (NORVASC) 10 MG tablet Take 0.5 tablets (5 mg total) by mouth every morning. 10/27/22   Alwyn Ren, MD  hydrochlorothiazide (HYDRODIURIL) 25 MG tablet Take 0.5 tablets (12.5 mg total) by mouth daily. 10/27/22   Alwyn Ren, MD      Allergies    Patient has no known allergies.    Review of Systems   Review  of Systems  Cardiovascular:  Positive for syncope.  Gastrointestinal:  Positive for abdominal pain.  Neurological:  Positive for syncope.  All other systems reviewed and are negative.   Physical Exam Updated Vital Signs BP (!) 156/97 (BP Location: Left Arm)   Pulse 66   Temp 98 F (36.7 C) (Oral)   Resp 19   Ht 5\' 7"  (1.702 m)   Wt 77.6 kg   SpO2 98%   BMI 26.80 kg/m  Physical Exam Vitals and nursing note reviewed.  Constitutional:      General: He is not in acute distress.    Appearance: He is well-developed.  HENT:     Head: Normocephalic and atraumatic.     Nose: Nose normal.      Mouth/Throat:     Mouth: Mucous membranes are moist.     Pharynx: Oropharynx is clear.  Eyes:     Extraocular Movements: Extraocular movements intact.     Conjunctiva/sclera: Conjunctivae normal.     Pupils: Pupils are equal, round, and reactive to light.  Cardiovascular:     Rate and Rhythm: Normal rate and regular rhythm.     Heart sounds: No murmur heard. Pulmonary:     Effort: Pulmonary effort is normal. No respiratory distress.     Breath sounds: Normal breath sounds.  Abdominal:     Palpations: Abdomen is soft.     Tenderness: There is no abdominal tenderness. There is no guarding or rebound.  Musculoskeletal:        General: No swelling.     Cervical back: Neck supple.     Right lower leg: No edema.     Left lower leg: No edema.  Skin:    General: Skin is warm and dry.     Capillary Refill: Capillary refill takes less than 2 seconds.  Neurological:     General: No focal deficit present.     Mental Status: He is alert and oriented to person, place, and time. Mental status is at baseline.  Psychiatric:        Mood and Affect: Mood normal.     ED Results / Procedures / Treatments   Labs (all labs ordered are listed, but only abnormal results are displayed) Labs Reviewed  BASIC METABOLIC PANEL - Abnormal; Notable for the following components:      Result Value   Glucose, Bld 114 (*)    Creatinine, Ser 1.47 (*)    GFR, Estimated 50 (*)    All other components within normal limits  CBC - Abnormal; Notable for the following components:   RBC 4.09 (*)    Hemoglobin 12.5 (*)    HCT 38.1 (*)    All other components within normal limits  CBC - Abnormal; Notable for the following components:   RBC 3.70 (*)    Hemoglobin 11.6 (*)    HCT 34.2 (*)    All other components within normal limits  COMPREHENSIVE METABOLIC PANEL - Abnormal; Notable for the following components:   Glucose, Bld 104 (*)    Total Protein 5.9 (*)    Albumin 3.3 (*)    All other components  within normal limits  MAGNESIUM  PHOSPHORUS  TROPONIN I (HIGH SENSITIVITY)  TROPONIN I (HIGH SENSITIVITY)    EKG None  Radiology ECHOCARDIOGRAM COMPLETE  Result Date: 10/27/2022    ECHOCARDIOGRAM REPORT   Patient Name:   Keith Webb Date of Exam: 10/27/2022 Medical Rec #:  [id number]      Height:  67.0 in Accession #:    [id number]     Weight:       171.1 lb Date of Birth:  Oct 18, 1950      BSA:          1.892 m Patient Age:    71 years       BP:           124/88 mmHg Patient Gender: M              HR:           72 bpm. Exam Location:  Inpatient Procedure: 2D Echo, Cardiac Doppler and Color Doppler Indications:    Syncope R55  History:        Patient has prior history of Echocardiogram examinations, most                 recent 07/07/2022. Signs/Symptoms:Syncope; Risk                 Factors:Hypertension.  Sonographer:    Lucendia Herrlich Referring Phys: Oliver Pila HALL IMPRESSIONS  1. Left ventricular ejection fraction, by estimation, is 65 to 70%. The left ventricle has normal function. The left ventricle has no regional wall motion abnormalities. Left ventricular diastolic parameters are consistent with Grade I diastolic dysfunction (impaired relaxation).  2. Right ventricular systolic function is normal. The right ventricular size is normal.  3. Left atrial size was mild to moderately dilated.  4. Right atrial size was mildly dilated.  5. The mitral valve is normal in structure. No evidence of mitral valve regurgitation. No evidence of mitral stenosis.  6. The aortic valve is tricuspid. There is mild calcification of the aortic valve. Aortic valve regurgitation is mild to moderate. Aortic valve sclerosis/calcification is present, without any evidence of aortic stenosis. Aortic regurgitation PHT measures 702 msec.  7. Aortic dilatation noted. There is borderline dilatation of the ascending aorta, measuring 39 mm.  8. The inferior vena cava is normal in size with greater than 50% respiratory  variability, suggesting right atrial pressure of 3 mmHg. FINDINGS  Left Ventricle: Left ventricular ejection fraction, by estimation, is 65 to 70%. The left ventricle has normal function. The left ventricle has no regional wall motion abnormalities. The left ventricular internal cavity size was normal in size. There is  no left ventricular hypertrophy. Left ventricular diastolic parameters are consistent with Grade I diastolic dysfunction (impaired relaxation). Right Ventricle: The right ventricular size is normal. No increase in right ventricular wall thickness. Right ventricular systolic function is normal. Left Atrium: Left atrial size was mild to moderately dilated. Right Atrium: Right atrial size was mildly dilated. Pericardium: There is no evidence of pericardial effusion. Mitral Valve: The mitral valve is normal in structure. No evidence of mitral valve regurgitation. No evidence of mitral valve stenosis. Tricuspid Valve: The tricuspid valve is normal in structure. Tricuspid valve regurgitation is mild . No evidence of tricuspid stenosis. Aortic Valve: The aortic valve is tricuspid. There is mild calcification of the aortic valve. Aortic valve regurgitation is mild to moderate. Aortic regurgitation PHT measures 702 msec. Aortic valve sclerosis/calcification is present, without any evidence of aortic stenosis. Aortic valve peak gradient measures 13.6 mmHg. Pulmonic Valve: The pulmonic valve was normal in structure. Pulmonic valve regurgitation is not visualized. No evidence of pulmonic stenosis. Aorta: Aortic dilatation noted. There is borderline dilatation of the ascending aorta, measuring 39 mm. Venous: The inferior vena cava is normal in size with greater than 50% respiratory variability, suggesting right atrial  pressure of 3 mmHg. IAS/Shunts: No atrial level shunt detected by color flow Doppler.  LEFT VENTRICLE PLAX 2D LVIDd:         4.40 cm   Diastology LVIDs:         2.80 cm   LV e' medial:    9.90 cm/s  LV PW:         1.00 cm   LV E/e' medial:  8.8 LV IVS:        0.90 cm   LV e' lateral:   13.60 cm/s LVOT diam:     1.90 cm   LV E/e' lateral: 6.4 LV SV:         72 LV SV Index:   38 LVOT Area:     2.84 cm  RIGHT VENTRICLE             IVC RV S prime:     20.30 cm/s  IVC diam: 1.60 cm TAPSE (M-mode): 3.0 cm LEFT ATRIUM             Index        RIGHT ATRIUM           Index LA diam:        3.90 cm 2.06 cm/m   RA Area:     19.50 cm LA Vol (A2C):   56.1 ml 29.64 ml/m  RA Volume:   52.40 ml  27.69 ml/m LA Vol (A4C):   60.4 ml 31.92 ml/m LA Biplane Vol: 60.3 ml 31.86 ml/m  AORTIC VALVE AV Area (Vmax): 1.89 cm AV Vmax:        184.50 cm/s AV Peak Grad:   13.6 mmHg LVOT Vmax:      123.00 cm/s LVOT Vmean:     77.033 cm/s LVOT VTI:       0.255 m AI PHT:         702 msec  AORTA Ao Root diam: 3.50 cm Ao Asc diam:  3.95 cm MITRAL VALVE               TRICUSPID VALVE MV Area (PHT): 2.36 cm    TR Peak grad:   8.6 mmHg MV Decel Time: 322 msec    TR Vmax:        147.00 cm/s MV E velocity: 86.90 cm/s MV A velocity: 98.65 cm/s  SHUNTS MV E/A ratio:  0.88        Systemic VTI:  0.26 m                            Systemic Diam: 1.90 cm Arvilla Meres MD Electronically signed by Arvilla Meres MD Signature Date/Time: 10/27/2022/10:47:41 AM    Final    DG Chest 2 View  Result Date: 10/26/2022 CLINICAL DATA:  Syncope EXAM: CHEST - 2 VIEW COMPARISON:  09/03/2015 FINDINGS: Cardiac size is within normal limits. Prominence of the pulmonary conus has not changed. There are no signs of pulmonary edema or focal pulmonary consolidation. There is no pleural effusion or pneumothorax. In the PA view, there is faint 8 mm nodular density in left lower lung field. There is surgical fusion in the lumbar spine. IMPRESSION: There are no signs of pulmonary edema or focal pulmonary consolidation. There is faint 8 mm nodular density in left lower lung field which may represent the nipple shadow or a new lung nodule such as granuloma or neoplasm.  Repeat PA view with nipple markers may be considered. Electronically Signed  By: Ernie Avena M.D.   On: 10/26/2022 15:02    Procedures Procedures    Medications Ordered in ED Medications  enoxaparin (LOVENOX) injection 40 mg (40 mg Subcutaneous Given 10/26/22 2208)  acetaminophen (TYLENOL) tablet 650 mg (has no administration in time range)  polyethylene glycol (MIRALAX / GLYCOLAX) packet 17 g (has no administration in time range)  melatonin tablet 3 mg (has no administration in time range)  prochlorperazine (COMPAZINE) injection 5 mg (has no administration in time range)  lactated ringers infusion (0 mLs Intravenous Stopped 10/27/22 1308)  pantoprazole (PROTONIX) EC tablet 40 mg (40 mg Oral Given 10/27/22 0853)  montelukast (SINGULAIR) tablet 10 mg (10 mg Oral Given 10/26/22 2209)  ezetimibe (ZETIA) tablet 10 mg (10 mg Oral Given 10/27/22 0853)    And  simvastatin (ZOCOR) tablet 40 mg (40 mg Oral Given 10/27/22 0853)  aspirin EC tablet 81 mg (81 mg Oral Given 10/26/22 2208)    ED Course/ Medical Decision Making/ A&P                             Medical Decision Making Amount and/or Complexity of Data Reviewed Labs: ordered. Radiology: ordered.  Risk Decision regarding hospitalization.   72 year old male presenting for episode of syncope.  Vital signs reviewed.  On exam he is well-appearing, currently is asymptomatic.  He did have some presenting abdominal pain, left lower quadrant and now resolved.  Abdominal exam is benign, do not think he needs imaging.  Differential including vasovagal syncope, ACS, arrhythmia, valvular abnormality.  He has no shortness of breath or pleuritic pain, low concern for PE.  No significant change in medications.  Could be component of hypervolemia given recent start of hydrochlorothiazide and increased exertion today.  I reviewed his chest x-ray, he has possible lung nodule versus shadow from his nipple, no signs of mediastinal widening to suggest  dissection, pulmonary edema, pneumothorax, or pleural effusion.  Cardiac silhouette is normal.  Labs notable for creatinine similar to prior.  CBC with mild anemia.  His troponin is negative x 2.  EKG reviewed, normal sinus rhythm, sinus arrhythmia.  QTc slightly prolonged at 463.  He has T wave inversion in V1, and nonspecific T wave normality in the lateral precordial leads with some biphasic T wave.  Compared to prior EKG, appears to be somewhat more exaggerated than prior.  No significant ST changes.  Patient remains asymptomatic.  However given his age, history of aortic regurgitation, and concerning history with syncope without clear cause and brief episode of CPR I think he would benefit from admission for telemetry, repeat echo, observation.  Patient is agreeable with this.  I discussed the patient with the hospitalist service and he was admitted for further management.        Final Clinical Impression(s) / ED Diagnoses Final diagnoses:  Syncope and collapse    Rx / DC Orders ED Discharge Orders          Ordered    amLODipine (NORVASC) 10 MG tablet  Every morning        10/27/22 1239    hydrochlorothiazide (HYDRODIURIL) 25 MG tablet  Daily        10/27/22 1239    Increase activity slowly        10/27/22 1239    Diet - low sodium heart healthy        10/27/22 1239  Fulton Reek, MD 10/27/22 1450    Blane Ohara, MD 10/28/22 2259

## 2022-10-26 NOTE — H&P (Signed)
History and Physical  Keith Webb WUJ:[id number] DOB: 01/24/1951 DOA: 10/26/2022  Referring physician: Dr. Earlene Plater, Resident-EDP PCP: Renaye Rakers, MD  Outpatient Specialists: Cardiology, urology, oncology. Patient coming from: Home.  Chief Complaint: Syncope  HPI: Keith Webb is a 72 y.o. male with medical history significant for syncope in 2006 secondary to orthostatic hypotension with dehydration, history of recurrent prostate cancer with radical prostatectomy follows with urology and oncology, hypertension, CKD 3A, prediabetes, hypercholesterolemia, former cigar smoker, history of environmental and seasonal allergies, who presents to [facility] ED from home after a syncopal episode.    The patient did not eat anything this morning and went for a 2 miles walk, then went to use his weed eater.  Ate minimally, peanut butter/cracker and drank small amount of fluid.  Suddenly developed left lower quadrant abdominal pain, felt woozy dizzy and passed out.  His wife performed 8 chest compressions and states he was unconscious for about 20 seconds.  No seizure-like activity, no postictal.  EMS was activated.  Upon EMS arrival he was noted to be hypotensive but responded well to IV fluid.  Blood sugar was within normal range.  No reported change in medications.  In the ED, workup was essentially unremarkable.  EDP requested admission for further workup and observation.  The patient was admitted by [facility], hospitalist service, to telemetry cardiac unit as observation status.  ED Course: Temperature 98.3.  BP 132/90, pulse 59, respiratory 15, saturation 100% on room air.  Lab studies markable for creatinine 1.47 with GFR 50.  High-sensitivity troponin 6, repeat 6.  Hemoglobin 12.5.  Review of Systems: Review of systems as noted in the HPI. All other systems reviewed and are negative.   Past Medical History:  Diagnosis Date   Arthritis    Environmental and seasonal allergies    GERD (gastroesophageal  reflux disease)    Heart murmur    History of syncope    2006  orthostatic hypotension w/ dehydration   Hypercholesterolemia    Hypertension    OSA (obstructive sleep apnea)    per pt used cpap for awhile but stopped because  didn't think he needed any more   Recurrent prostate cancer [facility]) urologist-  dr Lanelle Bal  oncologist-- dr Dayton Scrape   first dx 1998 (Gleason 7,  PSA 18.5) s/p  radical prostatectomy---  recurrent 10/ 2013  w/ salvage external radiation 06-24-2014 to 08-10-2014/  per pt (07-21-2016)  last PSA <5   S/P radiation therapy 06/24/2014 through 08/10/2014                                                     External beam of Prostate bed 6600 cGy in 33 sessions                          Wears glasses    Past Surgical History:  Procedure Laterality Date   LAMINECTOMY WITH POSTERIOR LATERAL ARTHRODESIS LEVEL 2 N/A 07/13/2017   Procedure: Laminectomy Lumbar Two-Three, Posterior Lateral Fusion - Lumbar One-Two-Lumbar Two-Three, extension of pedicle screw fixation  -Lumbar One-Lumbar Five;  Surgeon: Tia Alert, MD;  Location: [facility] OR;  Service: Neurosurgery;  Laterality: N/A;   POSTERIOR LAMINECTOMY / DECOMPRESSION LUMBAR SPINE  12/09/2009   W/  DECOMPRESSION AND FUSION L5--S1/  REMOVAL EPIDURAL SYNOVIAL CYST (per path no  neoplasm present)   POSTERIOR LAMINECTOMY / DECOMPRESSION LUMBAR SPINE  09/10/2015   decompressive laminectomy L4-5 left/  fixation and arthrodesis L3-5 /  removal hardware L5-S1   PROSTATECTOMY  04/01/1997   radical w/ bilateral pelvic lymph node dissection   RECTAL EXAM UNDER ANESTHESIA N/A 07/27/2016   Procedure: RECTAL EXAM UNDER ANESTHESIA;  Surgeon: Karie Soda, MD;  Location: [facility];  Service: General;  Laterality: N/A;  Hemorrhoidal ligation, suture pexy   RIGHT INDEX FINGER SYNOVECTOMY/ EXCISION GIANT CELL TUMOR  02/19/2001   TOTAL HIP ARTHROPLASTY Left 12/08/2002   TRANSTHORACIC ECHOCARDIOGRAM  01/03/2005   ef 55-65%/  trivial AR and MR     Social History:  reports that he quit smoking about 18 years ago. His smoking use included cigars. He has never used smokeless tobacco. He reports that he does not currently use drugs after having used the following drugs: Marijuana and Cocaine. He reports that he does not drink alcohol.   No Known Allergies  Family History  Problem Relation Age of Onset   Stroke Mother    Diabetes Sister    Aneurysm Sister    Other Brother        Automobile accident   Cancer Brother        prostate, surgery      Prior to Admission medications   Medication Sig Start Date End Date Taking? Authorizing Provider  amLODipine (NORVASC) 10 MG tablet Take 10 mg by mouth every morning.     [provider]  Aspirin 81 MG CAPS Take 81 mg by mouth once.    [provider]  celecoxib (CELEBREX) 200 MG capsule Take 200 mg by mouth 2 (two) times daily. 06/21/22   [provider]  clotrimazole-betamethasone (LOTRISONE) cream Apply 1 Application topically 2 (two) times daily. 02/21/22   [provider]  esomeprazole (NEXIUM) 40 MG capsule Take 40 mg by mouth daily at 12 noon.    [provider]  ezetimibe-simvastatin (VYTORIN) 10-40 MG per tablet Take 1 tablet by mouth every morning.     [provider]  guanFACINE (INTUNIV) 1 MG TB24 Take 1 mg by mouth at bedtime.     [provider]  hydrochlorothiazide (HYDRODIURIL) 25 MG tablet TAKE 1 TABLET(25 MG) BY MOUTH EVERY MORNING 10/16/22   Tolia, Sunit, DO  IRON PO Take 1 tablet by mouth daily.    [provider]  losartan (COZAAR) 100 MG tablet TAKE 1 TABLET BY MOUTH DAILY 09/25/22   Tolia, Sunit, DO  montelukast (SINGULAIR) 10 MG tablet Take 10 mg by mouth at bedtime.     [provider]  Multiple Vitamins-Minerals (CENTRUM SILVER PO) Take 1 tablet by mouth daily.    [provider]  rosuvastatin (CRESTOR) 10 MG tablet Take 10 mg by mouth at bedtime. 07/17/22   [provider]  sildenafil (VIAGRA) 100 MG tablet Take 100 mg by mouth daily as needed for erectile dysfunction.    [provider]    Physical Exam: BP (!) 132/90 (BP Location: Left Arm)   Pulse (!) 59   Temp 98.3 F (36.8 C) (Oral)   Resp 15   Ht 5\' 8"  (1.727 m)   Wt 84.6 kg   SpO2 100%   BMI 28.36 kg/m   General: 72 y.o. year-old male well developed well nourished in no acute distress.  Alert and oriented x3. Cardiovascular: Regular rate and rhythm with no rubs or gallops.  No thyromegaly or JVD noted.  No lower extremity edema. 2/4 pulses in all 4 extremities. Respiratory: Clear to auscultation with no wheezes or rales. Good inspiratory effort. Abdomen: Soft nontender nondistended with normal bowel sounds x4 quadrants. Muskuloskeletal: No cyanosis, clubbing or edema noted bilaterally Neuro: CN II-XII intact, strength, sensation, reflexes Skin: No ulcerative lesions noted or rashes Psychiatry: Judgement and insight appear normal. Mood is appropriate for condition and setting          Labs on Admission:  Basic Metabolic Panel: Recent Labs  Lab 10/26/22 1435  NA 139  K 3.5  CL 108  CO2 23  GLUCOSE 114*  BUN 22  CREATININE 1.47*  CALCIUM 9.8   Liver Function Tests: No results for input(s): "AST", "ALT", "ALKPHOS", "BILITOT", "PROT", "ALBUMIN" in the last 168 hours. No results for input(s): "LIPASE", "AMYLASE" in the last 168 hours. No results for input(s): "AMMONIA" in the last 168 hours. CBC: Recent Labs  Lab 10/26/22 1435  WBC 6.0  HGB 12.5*  HCT 38.1*  MCV 93.2  PLT 234   Cardiac Enzymes: No results for input(s): "CKTOTAL", "CKMB", "CKMBINDEX", "TROPONINI" in the last 168 hours.  BNP (last 3 results) No results for input(s): "BNP" in the last 8760 hours.  ProBNP (last 3 results) No results for input(s): "PROBNP" in the last 8760 hours.  CBG: No results for input(s): "GLUCAP" in the last 168 hours.  Radiological Exams on Admission: DG Chest 2  View  Result Date: 10/26/2022 CLINICAL DATA:  Syncope EXAM: CHEST - 2 VIEW COMPARISON:  09/03/2015 FINDINGS: Cardiac size is within normal limits. Prominence of the pulmonary conus has not changed. There are no signs of pulmonary edema or focal pulmonary consolidation. There is no pleural effusion or pneumothorax. In the PA view, there is faint 8 mm nodular density in left lower lung field. There is surgical fusion in the lumbar spine. IMPRESSION: There are no signs of pulmonary edema or focal pulmonary consolidation. There is faint 8 mm nodular density in left lower lung field which may represent the nipple shadow or a new lung nodule such as granuloma or neoplasm. Repeat PA view with nipple markers may be considered. Electronically Signed   By: Ernie Avena M.D.   On: 10/26/2022 15:02    EKG: I independently viewed the EKG done and my findings are as followed: None available at the time of this visit.  Assessment/Plan Present on Admission:  Syncope  Principal Problem:   Syncope  Syncope, ongoing work up, suspect from hypovolemia History of syncope in 2006 secondary to orthostatic hypotension with dehydration. Hypovolemic on exam Obtain orthostatic vital signs. Obtain 2D echo to rule out any cardiac structural abnormalities. Start gentle IV fluid hydration LR 50 cc/h x 1 day PT OT assessment Fall precautions.  Essential hypertension, BP is not at goal, elevated Resume home oral antihypertensives Monitor vital signs.  Hyperlipidemia Resume home regimen.  GERD Resume home PPI    DVT prophylaxis: Subcu Lovenox daily  Code Status: Full code  Family Communication: None at bedside  Disposition Plan: Admitted to telemetry cardiac unit  Consults called: None.  Admission status: Observation status   Status is: Observation    Darlin Drop MD [facility] Pager [phone]  If 7PM-7AM, please contact night-coverage [email or web address] Password  [facility]  10/26/2022, 9:00 PM   For on call review [email or web address].

## 2022-10-27 ENCOUNTER — Observation Stay (HOSPITAL_BASED_OUTPATIENT_CLINIC_OR_DEPARTMENT_OTHER): Payer: BC Managed Care – PPO

## 2022-10-27 DIAGNOSIS — R55 Syncope and collapse: Secondary | ICD-10-CM | POA: Diagnosis not present

## 2022-10-27 DIAGNOSIS — E785 Hyperlipidemia, unspecified: Secondary | ICD-10-CM | POA: Diagnosis not present

## 2022-10-27 DIAGNOSIS — Z87891 Personal history of nicotine dependence: Secondary | ICD-10-CM | POA: Diagnosis not present

## 2022-10-27 DIAGNOSIS — Z7982 Long term (current) use of aspirin: Secondary | ICD-10-CM | POA: Diagnosis not present

## 2022-10-27 LAB — ECHOCARDIOGRAM COMPLETE
AR max vel: 1.89 cm2
AV Peak grad: 13.6 mmHg
Ao pk vel: 1.85 m/s
Area-P 1/2: 2.36 cm2
Height: 67 in
P 1/2 time: 702 msec
S' Lateral: 2.8 cm
Weight: 2737.6 oz

## 2022-10-27 LAB — COMPREHENSIVE METABOLIC PANEL
ALT: 17 U/L (ref 0–44)
AST: 28 U/L (ref 15–41)
Albumin: 3.3 g/dL — ABNORMAL LOW (ref 3.5–5.0)
Alkaline Phosphatase: 39 U/L (ref 38–126)
Anion gap: 8 (ref 5–15)
BUN: 22 mg/dL (ref 8–23)
CO2: 23 mmol/L (ref 22–32)
Calcium: 9.1 mg/dL (ref 8.9–10.3)
Chloride: 105 mmol/L (ref 98–111)
Creatinine, Ser: 1.19 mg/dL (ref 0.61–1.24)
GFR, Estimated: >60 mL/min (ref >60)
Glucose, Bld: 104 mg/dL — ABNORMAL HIGH (ref 70–99)
Potassium: 3.8 mmol/L (ref 3.5–5.1)
Sodium: 136 mmol/L (ref 135–145)
Total Bilirubin: 0.3 mg/dL (ref 0.3–1.2)
Total Protein: 5.9 g/dL — ABNORMAL LOW (ref 6.5–8.1)

## 2022-10-27 LAB — CBC
HCT: 34.2 % — ABNORMAL LOW (ref 39.0–52.0)
Hemoglobin: 11.6 g/dL — ABNORMAL LOW (ref 13.0–17.0)
MCH: 31.4 pg (ref 26.0–34.0)
MCHC: 33.9 g/dL (ref 30.0–36.0)
MCV: 92.4 fL (ref 80.0–100.0)
Platelets: 209 10*3/uL (ref 150–400)
RBC: 3.7 MIL/uL — ABNORMAL LOW (ref 4.22–5.81)
RDW: 13.3 % (ref 11.5–15.5)
WBC: 6.8 10*3/uL (ref 4.0–10.5)
nRBC: 0 % (ref 0.0–0.2)

## 2022-10-27 LAB — MAGNESIUM: Magnesium: 1.7 mg/dL (ref 1.7–2.4)

## 2022-10-27 LAB — PHOSPHORUS: Phosphorus: 2.5 mg/dL (ref 2.5–4.6)

## 2022-10-27 MED ORDER — HYDROCHLOROTHIAZIDE 25 MG PO TABS
12.5000 mg | ORAL_TABLET | Freq: Every day | ORAL | 0 refills | Status: DC
Start: 2022-10-27 — End: 2023-01-11

## 2022-10-27 MED ORDER — SIMVASTATIN 20 MG PO TABS
40.0000 mg | ORAL_TABLET | Freq: Every day | ORAL | Status: DC
  Administered 2022-10-27: 40 mg via ORAL
  Filled 2022-10-27: qty 2

## 2022-10-27 MED ORDER — AMLODIPINE BESYLATE 10 MG PO TABS: 5.0000 mg | ORAL_TABLET | Freq: Every morning | ORAL | 2 refills | Status: AC

## 2022-10-27 MED ORDER — EZETIMIBE 10 MG PO TABS
10.0000 mg | ORAL_TABLET | Freq: Every day | ORAL | Status: DC
  Administered 2022-10-27: 10 mg via ORAL
  Filled 2022-10-27: qty 1

## 2022-10-27 NOTE — Care Management (Addendum)
  Transition of Care [facility]) Screening Note   Patient Details  Name: Keith Webb Date of Birth: [birth year]   Transition of Care [facility]) CM/SW Contact:    Gala Lewandowsky, RN Phone Number: 10/27/2022, 11:38 AM    Transition of Care Department [facility]) has reviewed the patient and no TOC needs have been identified at this time. Patient presented for syncope. PTA patient was from home with spouse. Patient may benefit from PT/OT prior to transition home. We will continue to monitor patient advancement through interdisciplinary progression rounds. If new patient transition needs arise, please place a TOC consult.

## 2022-10-27 NOTE — Plan of Care (Signed)

## 2022-10-27 NOTE — Evaluation (Signed)
Physical Therapy Evaluation Patient Details Name: Keith Webb MRN: [id number] DOB: 09-11-1950 Today's Date: 10/27/2022  History of Present Illness  Pt is a 72 year old male admitted on 10/26/22 after syncopal episode at home.  Past medical history significant for syncope in 2006 secondary to orthostatic hypotension with dehydration, history of recurrent prostate cancer with radical prostatectomy follows with urology and oncology, hypertension, CKD 3A, prediabetes, hypercholesterolemia, former cigar smoker, history of environmental and seasonal allergies.  Clinical Impression  Pt presents with admitting diagnosis above. Pt was able to ambulate in hallway with no AD and navigate stairs independently. Pt presents at or near baseline mobility. Pt has no further acute PT needs and will be signing off. Re consult PT if mobility status changes.        Recommendations for follow up therapy are one component of a multi-disciplinary discharge planning process, led by the attending physician.  Recommendations may be updated based on patient status, additional functional criteria and insurance authorization.  Follow Up Recommendations       Assistance Recommended at Discharge PRN  Patient can return home with the following       Equipment Recommendations None recommended by PT  Recommendations for Other Services       Functional Status Assessment Patient has had a recent decline in their functional status and demonstrates the ability to make significant improvements in function in a reasonable and predictable amount of time.     Precautions / Restrictions Precautions Precautions: Fall Restrictions Weight Bearing Restrictions: No      Mobility  Bed Mobility Overal bed mobility: Modified Independent             General bed mobility comments: HOB elevated    Transfers Overall transfer level: Independent Equipment used: None                     Ambulation/Gait Ambulation/Gait assistance: Independent Gait Distance (Feet): 250 Feet Assistive device: None Gait Pattern/deviations: Wide base of support, Step-through pattern Gait velocity: decreased     General Gait Details: Pt with a steady almost waddling gait. Pt reports "this is how I typically walk"  Stairs Stairs: Yes Stairs assistance: Independent Stair Management: One rail Right, Alternating pattern, Forwards Number of Stairs: 6 General stair comments: no LOB noted. Pt able to perform 3 steps x2 due to IV line  Wheelchair Mobility    Modified Rankin (Stroke Patients Only)       Balance Overall balance assessment: No apparent balance deficits (not formally assessed)                                           Pertinent Vitals/Pain Pain Assessment Pain Assessment: No/denies pain    Home Living Family/patient expects to be discharged to:: Private residence Living Arrangements: Spouse/significant other Available Help at Discharge: Family;Available 24 hours/day Type of Home: House Home Access: Stairs to enter Entrance Stairs-Rails: Can reach both;Right;Left Entrance Stairs-Number of Steps: 6   Home Layout: One level Home Equipment: Cane - single point;Rolling Walker (2 wheels);Grab bars - tub/shower;Hand held shower head      Prior Function Prior Level of Function : Independent/Modified Independent;Driving;Working/employed             Mobility Comments: Ind no AD ADLs Comments: Ind     Hand Dominance   Dominant Hand: Right    Extremity/Trunk Assessment  Upper Extremity Assessment Upper Extremity Assessment: Overall WFL for tasks assessed    Lower Extremity Assessment Lower Extremity Assessment: Overall WFL for tasks assessed    Cervical / Trunk Assessment Cervical / Trunk Assessment: Normal  Communication   Communication: No difficulties  Cognition Arousal/Alertness: Awake/alert Behavior During Therapy: WFL for  tasks assessed/performed Overall Cognitive Status: Within Functional Limits for tasks assessed                                          General Comments General comments (skin integrity, edema, etc.): VSS on RA    Exercises     Assessment/Plan    PT Assessment Patient does not need any further PT services  PT Problem List         PT Treatment Interventions      PT Goals (Current goals can be found in the Care Plan section)       Frequency       Co-evaluation               AM-PAC PT "6 Clicks" Mobility  Outcome Measure Help needed turning from your back to your side while in a flat bed without using bedrails?: None Help needed moving from lying on your back to sitting on the side of a flat bed without using bedrails?: None Help needed moving to and from a bed to a chair (including a wheelchair)?: None Help needed standing up from a chair using your arms (e.g., wheelchair or bedside chair)?: None Help needed to walk in hospital room?: None Help needed climbing 3-5 steps with a railing? : None 6 Click Score: 24    End of Session Equipment Utilized During Treatment: Gait belt Activity Tolerance: Patient tolerated treatment well Patient left: in bed;with call bell/phone within reach Nurse Communication: Mobility status PT Visit Diagnosis: Other abnormalities of gait and mobility (R26.89)    Time: 2841-3244 PT Time Calculation (min) (ACUTE ONLY): 12 min   Charges:   PT Evaluation $PT Eval Low Complexity: 1 Low PT Treatments $Gait Training: 8-22 mins        Shela Nevin, PT, DPT Acute Rehab Services [phone]   Keith Webb 10/27/2022, 12:26 PM

## 2022-10-27 NOTE — Progress Notes (Signed)
Echocardiogram 2D Echocardiogram has been performed.  Lucendia Herrlich 10/27/2022, 10:23 AM

## 2022-10-27 NOTE — Discharge Summary (Signed)
Physician Discharge Summary  Keith Webb ZOX:[id number] DOB: February 20, 1951 DOA: 10/26/2022  PCP: Renaye Rakers, MD  Admit date: 10/26/2022 Discharge date: 10/27/2022  Admitted From: Home Disposition: Home  Recommendations for Outpatient Follow-up:  Follow up with PCP in 1-2 weeks Please obtain BMP/CBC in one week  Home Health: None Equipment/Devices: No Discharge Condition: Stable CODE STATUS: Full code diet recommendation: Cardiac Brief/Interim Summary: Keith Webb is a 72 y.o. male with medical history significant for syncope in 2006 secondary to orthostatic hypotension with dehydration, history of recurrent prostate cancer with radical prostatectomy follows with urology and oncology, hypertension, CKD 3A, prediabetes, hypercholesterolemia, former cigar smoker, history of environmental and seasonal allergies, who presents to [facility] ED from home after a syncopal episode.     The patient did not eat anything the morning of walk and went for a 2 miles walk, then went to use his weed eater.  Ate minimally, peanut butter/cracker and drank small amount of fluid.  Suddenly developed left lower quadrant abdominal pain, felt woozy dizzy and passed out.  His wife performed 8 chest compressions and states he was unconscious for about 20 seconds.  No seizure-like activity, no postictal.  EMS was activated.  Upon EMS arrival he was noted to be hypotensive but responded well to IV fluid.  Blood sugar was within normal range.  No reported change in medications.   In the ED, workup was essentially unremarkable.  EDP requested admission for further workup and observation.  The patient was admitted by [facility], hospitalist service, to telemetry cardiac unit as observation status.   ED Course: Temperature 98.3.  BP 132/90, pulse 59, respiratory 15, saturation 100% on room air.  Lab studies markable for creatinine 1.47 with GFR 50.  High-sensitivity troponin 6, repeat 6.  Hemoglobin 12.5.  Discharge Diagnoses:   Principal Problem:   Syncope    Syncope-patient was admitted with an episode of syncope while he went for a 2 miles walk on empty stomach without eating or drinking prior to walking.  EMS noted he was hypotensive and was given IV fluids en route to the ER.  He was admitted to telemetry floor.  Telemetry did not reveal any arrhythmias.  Echocardiogram was done with good ejection fraction.  He was able to ambulate prior to discharge without any dizziness or symptoms.  He remained stable during his hospital stay.  He was not orthostatic during the hospital stay.  He was given basically IV fluids and his symptoms improved.  Upon discharge I have decreased the dose of Norvasc and hydrochlorothiazide.  I advised him to check his vital signs daily and write it down and take to his primary care physician and follow-up with PCP to adjust the dose of antihypertensives.     Essential hypertension continue Norvasc HCTZ and Cozaar.  Note that I have decreased the dose of Norvasc and hydrochlorothiazide as his blood pressure was low.   Hyperlipidemia Continue statin   GERD continue PPI Resume home PPI   Estimated body mass index is 26.8 kg/m as calculated from the following:   Height as of this encounter: 5\' 7"  (1.702 m).   Weight as of this encounter: 77.6 kg.  Discharge Instructions  Discharge Instructions     Diet - low sodium heart healthy   Complete by: As directed    Increase activity slowly   Complete by: As directed       Allergies as of 10/27/2022   No Known Allergies      Medication List  TAKE these medications    amLODipine 10 MG tablet Commonly known as: NORVASC Take 0.5 tablets (5 mg total) by mouth every morning. What changed: how much to take   CENTRUM SILVER PO Take 1 tablet by mouth daily.   clotrimazole-betamethasone cream Commonly known as: LOTRISONE Apply 1 Application topically 2 (two) times daily.   esomeprazole 40 MG capsule Commonly known as:  NEXIUM Take 40 mg by mouth daily at 12 noon.   ezetimibe-simvastatin 10-40 MG tablet Commonly known as: VYTORIN Take 1 tablet by mouth every morning.   guanFACINE 1 MG Tb24 ER tablet Commonly known as: INTUNIV Take 1 mg by mouth at bedtime.   hydrochlorothiazide 25 MG tablet Commonly known as: HYDRODIURIL Take 0.5 tablets (12.5 mg total) by mouth daily. What changed: See the new instructions.   IRON PO Take 1 tablet by mouth daily.   losartan 100 MG tablet Commonly known as: COZAAR TAKE 1 TABLET BY MOUTH DAILY   montelukast 10 MG tablet Commonly known as: SINGULAIR Take 10 mg by mouth at bedtime.   rosuvastatin 10 MG tablet Commonly known as: CRESTOR Take 10 mg by mouth daily.   sildenafil 100 MG tablet Commonly known as: VIAGRA Take 100 mg by mouth daily as needed for erectile dysfunction.        No Known Allergies  Consultations: None   Procedures/Studies: ECHOCARDIOGRAM COMPLETE  Result Date: 10/27/2022    ECHOCARDIOGRAM REPORT   Patient Name:   Keith Webb Date of Exam: 10/27/2022 Medical Rec #:  [id number]      Height:       67.0 in Accession #:    [id number]     Weight:       171.1 lb Date of Birth:  Oct 12, 1950      BSA:          1.892 m Patient Age:    71 years       BP:           124/88 mmHg Patient Gender: M              HR:           72 bpm. Exam Location:  Inpatient Procedure: 2D Echo, Cardiac Doppler and Color Doppler Indications:    Syncope R55  History:        Patient has prior history of Echocardiogram examinations, most                 recent 07/07/2022. Signs/Symptoms:Syncope; Risk                 Factors:Hypertension.  Sonographer:    Lucendia Herrlich Referring Phys: Oliver Pila HALL IMPRESSIONS  1. Left ventricular ejection fraction, by estimation, is 65 to 70%. The left ventricle has normal function. The left ventricle has no regional wall motion abnormalities. Left ventricular diastolic parameters are consistent with Grade I diastolic dysfunction  (impaired relaxation).  2. Right ventricular systolic function is normal. The right ventricular size is normal.  3. Left atrial size was mild to moderately dilated.  4. Right atrial size was mildly dilated.  5. The mitral valve is normal in structure. No evidence of mitral valve regurgitation. No evidence of mitral stenosis.  6. The aortic valve is tricuspid. There is mild calcification of the aortic valve. Aortic valve regurgitation is mild to moderate. Aortic valve sclerosis/calcification is present, without any evidence of aortic stenosis. Aortic regurgitation PHT measures 702 msec.  7. Aortic dilatation noted. There is borderline dilatation of  the ascending aorta, measuring 39 mm.  8. The inferior vena cava is normal in size with greater than 50% respiratory variability, suggesting right atrial pressure of 3 mmHg. FINDINGS  Left Ventricle: Left ventricular ejection fraction, by estimation, is 65 to 70%. The left ventricle has normal function. The left ventricle has no regional wall motion abnormalities. The left ventricular internal cavity size was normal in size. There is  no left ventricular hypertrophy. Left ventricular diastolic parameters are consistent with Grade I diastolic dysfunction (impaired relaxation). Right Ventricle: The right ventricular size is normal. No increase in right ventricular wall thickness. Right ventricular systolic function is normal. Left Atrium: Left atrial size was mild to moderately dilated. Right Atrium: Right atrial size was mildly dilated. Pericardium: There is no evidence of pericardial effusion. Mitral Valve: The mitral valve is normal in structure. No evidence of mitral valve regurgitation. No evidence of mitral valve stenosis. Tricuspid Valve: The tricuspid valve is normal in structure. Tricuspid valve regurgitation is mild . No evidence of tricuspid stenosis. Aortic Valve: The aortic valve is tricuspid. There is mild calcification of the aortic valve. Aortic valve  regurgitation is mild to moderate. Aortic regurgitation PHT measures 702 msec. Aortic valve sclerosis/calcification is present, without any evidence of aortic stenosis. Aortic valve peak gradient measures 13.6 mmHg. Pulmonic Valve: The pulmonic valve was normal in structure. Pulmonic valve regurgitation is not visualized. No evidence of pulmonic stenosis. Aorta: Aortic dilatation noted. There is borderline dilatation of the ascending aorta, measuring 39 mm. Venous: The inferior vena cava is normal in size with greater than 50% respiratory variability, suggesting right atrial pressure of 3 mmHg. IAS/Shunts: No atrial level shunt detected by color flow Doppler.  LEFT VENTRICLE PLAX 2D LVIDd:         4.40 cm   Diastology LVIDs:         2.80 cm   LV e' medial:    9.90 cm/s LV PW:         1.00 cm   LV E/e' medial:  8.8 LV IVS:        0.90 cm   LV e' lateral:   13.60 cm/s LVOT diam:     1.90 cm   LV E/e' lateral: 6.4 LV SV:         72 LV SV Index:   38 LVOT Area:     2.84 cm  RIGHT VENTRICLE             IVC RV S prime:     20.30 cm/s  IVC diam: 1.60 cm TAPSE (M-mode): 3.0 cm LEFT ATRIUM             Index        RIGHT ATRIUM           Index LA diam:        3.90 cm 2.06 cm/m   RA Area:     19.50 cm LA Vol (A2C):   56.1 ml 29.64 ml/m  RA Volume:   52.40 ml  27.69 ml/m LA Vol (A4C):   60.4 ml 31.92 ml/m LA Biplane Vol: 60.3 ml 31.86 ml/m  AORTIC VALVE AV Area (Vmax): 1.89 cm AV Vmax:        184.50 cm/s AV Peak Grad:   13.6 mmHg LVOT Vmax:      123.00 cm/s LVOT Vmean:     77.033 cm/s LVOT VTI:       0.255 m AI PHT:         702 msec  AORTA Ao Root diam: 3.50 cm Ao Asc diam:  3.95 cm MITRAL VALVE               TRICUSPID VALVE MV Area (PHT): 2.36 cm    TR Peak grad:   8.6 mmHg MV Decel Time: 322 msec    TR Vmax:        147.00 cm/s MV E velocity: 86.90 cm/s MV A velocity: 98.65 cm/s  SHUNTS MV E/A ratio:  0.88        Systemic VTI:  0.26 m                            Systemic Diam: 1.90 cm Arvilla Meres MD Electronically  signed by Arvilla Meres MD Signature Date/Time: 10/27/2022/10:47:41 AM    Final    DG Chest 2 View  Result Date: 10/26/2022 CLINICAL DATA:  Syncope EXAM: CHEST - 2 VIEW COMPARISON:  09/03/2015 FINDINGS: Cardiac size is within normal limits. Prominence of the pulmonary conus has not changed. There are no signs of pulmonary edema or focal pulmonary consolidation. There is no pleural effusion or pneumothorax. In the PA view, there is faint 8 mm nodular density in left lower lung field. There is surgical fusion in the lumbar spine. IMPRESSION: There are no signs of pulmonary edema or focal pulmonary consolidation. There is faint 8 mm nodular density in left lower lung field which may represent the nipple shadow or a new lung nodule such as granuloma or neoplasm. Repeat PA view with nipple markers may be considered. Electronically Signed   By: Ernie Avena M.D.   On: 10/26/2022 15:02   (Echo, Carotid, EGD, Colonoscopy, ERCP)    Subjective:  Sitting up in bed anxious to go home ambulated in the hallway as well as in the room without feeling dizzy Discharge Exam: Vitals:   10/27/22 0838 10/27/22 1241  BP: (!) 143/88 (!) 156/97  Pulse: 74 66  Resp: 18 19  Temp: 98.5 F (36.9 C) 98 F (36.7 C)  SpO2: 100% 98%   Vitals:   10/26/22 2308 10/27/22 0510 10/27/22 0838 10/27/22 1241  BP:  124/88 (!) 143/88 (!) 156/97  Pulse:  (!) 58 74 66  Resp: 16 18 18 19   Temp: 98.3 F (36.8 C) 98 F (36.7 C) 98.5 F (36.9 C) 98 F (36.7 C)  TempSrc: Oral Oral Oral Oral  SpO2:  100% 100% 98%  Weight: 77.6 kg     Height: 5\' 7"  (1.702 m)       General: Pt is alert, awake, not in acute distress Cardiovascular: RRR, S1/S2 +, no rubs, no gallops Respiratory: CTA bilaterally, no wheezing, no rhonchi Abdominal: Soft, NT, ND, bowel sounds + Extremities: no edema, no cyanosis    The results of significant diagnostics from this hospitalization (including imaging, microbiology, ancillary and  laboratory) are listed below for reference.     Microbiology: No results found for this or any previous visit (from the past 240 hour(s)).   Labs: BNP (last 3 results) No results for input(s): "BNP" in the last 8760 hours. Basic Metabolic Panel: Recent Labs  Lab 10/26/22 1435 10/27/22 0357  NA 139 136  K 3.5 3.8  CL 108 105  CO2 23 23  GLUCOSE 114* 104*  BUN 22 22  CREATININE 1.47* 1.19  CALCIUM 9.8 9.1  MG  --  1.7  PHOS  --  2.5   Liver Function Tests: Recent Labs  Lab 10/27/22 0357  AST 28  ALT 17  ALKPHOS 39  BILITOT 0.3  PROT 5.9*  ALBUMIN 3.3*   No results for input(s): "LIPASE", "AMYLASE" in the last 168 hours. No results for input(s): "AMMONIA" in the last 168 hours. CBC: Recent Labs  Lab 10/26/22 1435 10/27/22 0357  WBC 6.0 6.8  HGB 12.5* 11.6*  HCT 38.1* 34.2*  MCV 93.2 92.4  PLT 234 209   Cardiac Enzymes: No results for input(s): "CKTOTAL", "CKMB", "CKMBINDEX", "TROPONINI" in the last 168 hours. BNP: Invalid input(s): "POCBNP" CBG: No results for input(s): "GLUCAP" in the last 168 hours. D-Dimer No results for input(s): "DDIMER" in the last 72 hours. Hgb A1c No results for input(s): "HGBA1C" in the last 72 hours. Lipid Profile No results for input(s): "CHOL", "HDL", "LDLCALC", "TRIG", "CHOLHDL", "LDLDIRECT" in the last 72 hours. Thyroid function studies No results for input(s): "TSH", "T4TOTAL", "T3FREE", "THYROIDAB" in the last 72 hours.  Invalid input(s): "FREET3" Anemia work up No results for input(s): "VITAMINB12", "FOLATE", "FERRITIN", "TIBC", "IRON", "RETICCTPCT" in the last 72 hours. Urinalysis    Component Value Date/Time   COLORURINE YELLOW 11/12/2009 1059   APPEARANCEUR CLEAR 11/12/2009 1059   LABSPEC 1.005 07/16/2014 1514   PHURINE 6.5 07/16/2014 1514   PHURINE 5.5 11/12/2009 1059   GLUCOSEU Negative 07/16/2014 1514   HGBUR Large 07/16/2014 1514   HGBUR NEGATIVE 11/12/2009 1059   BILIRUBINUR Negative 07/16/2014 1514    KETONESUR Negative 07/16/2014 1514   KETONESUR NEGATIVE 11/12/2009 1059   PROTEINUR Negative 07/16/2014 1514   PROTEINUR NEGATIVE 11/12/2009 1059   UROBILINOGEN 0.2 07/16/2014 1514   NITRITE Negative 07/16/2014 1514   NITRITE NEGATIVE 11/12/2009 1059   LEUKOCYTESUR Negative 07/16/2014 1514   Sepsis Labs Recent Labs  Lab 10/26/22 1435 10/27/22 0357  WBC 6.0 6.8   Microbiology No results found for this or any previous visit (from the past 240 hour(s)).  Time coordinating discharge: 30 minutes SIGNED: Alwyn Ren, MD  [facility] 10/27/2022, 5:06 PM

## 2022-11-05 DIAGNOSIS — G4733 Obstructive sleep apnea (adult) (pediatric): Secondary | ICD-10-CM | POA: Diagnosis not present

## 2022-11-07 ENCOUNTER — Other Ambulatory Visit: Payer: Self-pay

## 2022-11-07 ENCOUNTER — Emergency Department (HOSPITAL_COMMUNITY)
Admission: EM | Admit: 2022-11-07 | Discharge: 2022-11-07 | Disposition: A | Discharge status: Home / Self Care | Payer: BC Managed Care – PPO | Attending: Emergency Medicine | Admitting: Emergency Medicine

## 2022-11-07 ENCOUNTER — Emergency Department (HOSPITAL_COMMUNITY): Payer: BC Managed Care – PPO

## 2022-11-07 DIAGNOSIS — S0990XA Unspecified injury of head, initial encounter: Secondary | ICD-10-CM | POA: Diagnosis not present

## 2022-11-07 DIAGNOSIS — M542 Cervicalgia: Secondary | ICD-10-CM | POA: Diagnosis not present

## 2022-11-07 DIAGNOSIS — M47812 Spondylosis without myelopathy or radiculopathy, cervical region: Secondary | ICD-10-CM | POA: Diagnosis not present

## 2022-11-07 DIAGNOSIS — M4312 Spondylolisthesis, cervical region: Secondary | ICD-10-CM | POA: Diagnosis not present

## 2022-11-07 DIAGNOSIS — M436 Torticollis: Secondary | ICD-10-CM | POA: Diagnosis not present

## 2022-11-07 MED ORDER — OXYCODONE-ACETAMINOPHEN 5-325 MG PO TABS
2.0000 | ORAL_TABLET | Freq: Once | ORAL | Status: AC
  Administered 2022-11-07: 2 via ORAL
  Filled 2022-11-07: qty 2

## 2022-11-07 MED ORDER — CELECOXIB 200 MG PO CAPS: 200.0000 mg | ORAL_CAPSULE | Freq: Two times a day (BID) | ORAL | 0 refills | Status: AC

## 2022-11-07 NOTE — ED Provider Notes (Signed)
EMERGENCY DEPARTMENT AT [facility] Provider Note   CSN: [id number] Arrival date & time: 11/07/22  6295     History  No chief complaint on file.   Keith Webb is a 72 y.o. male.  HPI 72 year old male presents today complaining of neck pain.  Patient states he was seen here recently for a syncopal event.  He reports that they thought that was secondary to him being somewhat dehydrated.  Review of records reveal that he was to be volume depleted.  He states that he slumped to the ground that time he started having pain in the right side of his neck.  Has taken Flexeril and Tylenol without relief.  He denies numbness tingling or weakness.  Not on any blood thinners and he does not think that he struck his head.    Home Medications Prior to Admission medications   Medication Sig Start Date End Date Taking? Authorizing Provider  celecoxib (CELEBREX) 200 MG capsule Take 1 capsule (200 mg total) by mouth 2 (two) times daily. 11/07/22  Yes Margarita Grizzle, MD  amLODipine (NORVASC) 10 MG tablet Take 0.5 tablets (5 mg total) by mouth every morning. 10/27/22   Alwyn Ren, MD  clotrimazole-betamethasone (LOTRISONE) cream Apply 1 Application topically 2 (two) times daily. 02/21/22   [provider]  esomeprazole (NEXIUM) 40 MG capsule Take 40 mg by mouth daily at 12 noon.    [provider]  ezetimibe-simvastatin (VYTORIN) 10-40 MG per tablet Take 1 tablet by mouth every morning.     [provider]  guanFACINE (INTUNIV) 1 MG TB24 Take 1 mg by mouth at bedtime.     [provider]  hydrochlorothiazide (HYDRODIURIL) 25 MG tablet Take 0.5 tablets (12.5 mg total) by mouth daily. 10/27/22   Alwyn Ren, MD  IRON PO Take 1 tablet by mouth daily.    [provider]  losartan (COZAAR) 100 MG tablet TAKE 1 TABLET BY MOUTH DAILY Patient taking differently: Take 100 mg by mouth daily. 09/25/22   Tolia, Sunit, DO  montelukast  (SINGULAIR) 10 MG tablet Take 10 mg by mouth at bedtime.    [provider]  Multiple Vitamins-Minerals (CENTRUM SILVER PO) Take 1 tablet by mouth daily.    [provider]  rosuvastatin (CRESTOR) 10 MG tablet Take 10 mg by mouth daily. 07/17/22   [provider]  sildenafil (VIAGRA) 100 MG tablet Take 100 mg by mouth daily as needed for erectile dysfunction.    [provider]      Allergies    Patient has no known allergies.    Review of Systems   Review of Systems  Physical Exam Updated Vital Signs BP (!) 151/101 (BP Location: Right Arm)   Pulse 89   Temp 98.3 F (36.8 C) (Oral)   Resp 18   Ht 1.702 m (5\' 7" )   Wt 80.3 kg   SpO2 100%   BMI 27.72 kg/m  Physical Exam Vitals and nursing note reviewed.  HENT:     Head: Normocephalic and atraumatic.     Right Ear: External ear normal.     Left Ear: External ear normal.     Nose: Nose normal.     Mouth/Throat:     Pharynx: Oropharynx is clear.  Eyes:     Pupils: Pupils are equal, round, and reactive to light.  Neck:     Comments: Patient holding head flexed to the right There is no point cervical tenderness but  there is some muscle spasm and tenderness to the right posterior side of his neck Cardiovascular:     Rate and Rhythm: Normal rate.     Pulses: Normal pulses.  Pulmonary:     Effort: Pulmonary effort is normal.  Abdominal:     General: Abdomen is flat.  Musculoskeletal:        General: Normal range of motion.  Skin:    General: Skin is warm.     Capillary Refill: Capillary refill takes less than 2 seconds.  Neurological:     General: No focal deficit present.     Mental Status: He is alert.  Psychiatric:        Mood and Affect: Mood normal.     ED Results / Procedures / Treatments   Labs (all labs ordered are listed, but only abnormal results are displayed) Labs Reviewed - No data to display  EKG None  Radiology CT Head Wo Contrast  Result Date:  11/07/2022 CLINICAL DATA:  Provided history: Head trauma, minor.  Neck trauma. EXAM: CT HEAD WITHOUT CONTRAST CT CERVICAL SPINE WITHOUT CONTRAST TECHNIQUE: Multidetector CT imaging of the head and cervical spine was performed following the standard protocol without intravenous contrast. Multiplanar CT image reconstructions of the cervical spine were also generated. RADIATION DOSE REDUCTION: This exam was performed according to the departmental dose-optimization program which includes automated exposure control, adjustment of the mA and/or kV according to patient size and/or use of iterative reconstruction technique. COMPARISON:  Head CT 01/01/2005. Cervical spine radiographs 01/23/2022. FINDINGS: CT HEAD FINDINGS Brain: Mild generalized cerebral atrophy. Patchy and ill-defined hypoattenuation within the cerebral white matter, nonspecific but compatible with mild chronic small vessel ischemic disease. Cavum septum pellucidum and cavum vergae (common anatomic variant). There is no acute intracranial hemorrhage. No demarcated cortical infarct. No extra-axial fluid collection. No evidence of an intracranial mass. No midline shift. Vascular: No hyperdense vessel. Atherosclerotic calcifications. Skull: No fracture or aggressive osseous lesion. Sinuses/Orbits: No mass or acute finding within the imaged orbits. Trace mucosal thickening within the bilateral ethmoid sinuses. CT CERVICAL SPINE FINDINGS Alignment: Levocurvature of the cervical spine. Nonspecific reversal of the expected cervical lordosis. Slight grade 1 anterolisthesis at C4-C5. Slight grade 1 retrolisthesis at C5-C6. Skull base and vertebrae: The basion-dental and atlanto-dental intervals are maintained.No evidence of acute fracture to the cervical spine. Soft tissues and spinal canal: No prevertebral fluid or swelling. No visible canal hematoma. Disc levels: Cervical spondylosis with multilevel disc space narrowing, disc bulges, posterior disc osteophyte  complexes, endplate spurring, uncovertebral hypertrophy and facet arthrosis. Disc space narrowing is greatest at C6-C7 (moderate/advanced at this level). No appreciable high-grade spinal canal stenosis. Multilevel bony neural foraminal narrowing. Upper chest: No consolidation within the imaged lung apices. No visible pneumothorax. IMPRESSION: CT head: 1.  No evidence of an acute intracranial abnormality. 2. Mild chronic small vessel ischemic changes within the cerebral white matter. 3. Mild cerebral atrophy. CT cervical spine: 1. No evidence of acute fracture to the cervical spine. 2. Nonspecific reversal of the expected cervical lordosis. 3. Levocurvature of the cervical spine. 4. Mild grade 1 spondylolisthesis at C4-C5 and C5-C6. 5. Cervical spondylosis as described. Electronically Signed   By: Jackey Loge D.O.   On: 11/07/2022 09:03   CT Cervical Spine Wo Contrast  Result Date: 11/07/2022 CLINICAL DATA:  Provided history: Head trauma, minor.  Neck trauma. EXAM: CT HEAD WITHOUT CONTRAST CT CERVICAL SPINE WITHOUT CONTRAST TECHNIQUE: Multidetector CT imaging of the head and cervical spine was performed following  the standard protocol without intravenous contrast. Multiplanar CT image reconstructions of the cervical spine were also generated. RADIATION DOSE REDUCTION: This exam was performed according to the departmental dose-optimization program which includes automated exposure control, adjustment of the mA and/or kV according to patient size and/or use of iterative reconstruction technique. COMPARISON:  Head CT 01/01/2005. Cervical spine radiographs 01/23/2022. FINDINGS: CT HEAD FINDINGS Brain: Mild generalized cerebral atrophy. Patchy and ill-defined hypoattenuation within the cerebral white matter, nonspecific but compatible with mild chronic small vessel ischemic disease. Cavum septum pellucidum and cavum vergae (common anatomic variant). There is no acute intracranial hemorrhage. No demarcated cortical  infarct. No extra-axial fluid collection. No evidence of an intracranial mass. No midline shift. Vascular: No hyperdense vessel. Atherosclerotic calcifications. Skull: No fracture or aggressive osseous lesion. Sinuses/Orbits: No mass or acute finding within the imaged orbits. Trace mucosal thickening within the bilateral ethmoid sinuses. CT CERVICAL SPINE FINDINGS Alignment: Levocurvature of the cervical spine. Nonspecific reversal of the expected cervical lordosis. Slight grade 1 anterolisthesis at C4-C5. Slight grade 1 retrolisthesis at C5-C6. Skull base and vertebrae: The basion-dental and atlanto-dental intervals are maintained.No evidence of acute fracture to the cervical spine. Soft tissues and spinal canal: No prevertebral fluid or swelling. No visible canal hematoma. Disc levels: Cervical spondylosis with multilevel disc space narrowing, disc bulges, posterior disc osteophyte complexes, endplate spurring, uncovertebral hypertrophy and facet arthrosis. Disc space narrowing is greatest at C6-C7 (moderate/advanced at this level). No appreciable high-grade spinal canal stenosis. Multilevel bony neural foraminal narrowing. Upper chest: No consolidation within the imaged lung apices. No visible pneumothorax. IMPRESSION: CT head: 1.  No evidence of an acute intracranial abnormality. 2. Mild chronic small vessel ischemic changes within the cerebral white matter. 3. Mild cerebral atrophy. CT cervical spine: 1. No evidence of acute fracture to the cervical spine. 2. Nonspecific reversal of the expected cervical lordosis. 3. Levocurvature of the cervical spine. 4. Mild grade 1 spondylolisthesis at C4-C5 and C5-C6. 5. Cervical spondylosis as described. Electronically Signed   By: Jackey Loge D.O.   On: 11/07/2022 09:03    Procedures Procedures    Medications Ordered in ED Medications  oxyCODONE-acetaminophen (PERCOCET/ROXICET) 5-325 MG per tablet 2 tablet (2 tablets Oral Given 11/07/22 6045)    ED Course/  Medical Decision Making/ A&P                             Medical Decision Making Amount and/or Complexity of Data Reviewed Radiology: ordered.  Risk Prescription drug management.   72 year old male presents today complaining of neck pain.  He had a near syncopal episode several weeks ago.  This was worked up and be due to volume depletion.  Has had some ongoing neck pain and spasm since that time. Differential diagnosis includes but is not limited to acute trauma with fracture, dislocation of the vertebral body, other bony abnormality such as mets, muscle spasm, infection. Doubt infection patient without fever no redness swelling or warmth CT obtained shows no evidence of acute fracture or bony lesions. Suspect that this is mainly secondary to muscle spasm. Patient given Percocet here in the ED. We have discussed conservative therapy including nonsteroidals, topical agents, gentle movement, and follow-up with primary care for potential PT.         Final Clinical Impression(s) / ED Diagnoses Final diagnoses:  Torticollis    Rx / DC Orders ED Discharge Orders          Ordered  celecoxib (CELEBREX) 200 MG capsule  2 times daily        11/07/22 0920              Margarita Grizzle, MD 11/07/22 [phone]

## 2022-11-07 NOTE — ED Triage Notes (Signed)
Pt c/o worsening posterior head and neck pain that started 2 days ago; evaluated 5/16 after syncopal episode, states not having the pain then; no new injury; described a pressure; endorses night sweats, pain worse when turning head to left

## 2022-11-07 NOTE — Discharge Instructions (Addendum)
Please begin Celebrex as prescribed. Continue acetaminophen, alternate warm and cold packs and use gentle movement. Follow-up with your physician in the next 1 to 2 days if symptoms are not resolving Return to the emergency department immediately if symptoms are worsening or you have weakness on one side of the other

## 2022-11-08 DIAGNOSIS — I10 Essential (primary) hypertension: Secondary | ICD-10-CM | POA: Diagnosis not present

## 2022-11-08 DIAGNOSIS — M130 Polyarthritis, unspecified: Secondary | ICD-10-CM | POA: Diagnosis not present

## 2022-11-08 DIAGNOSIS — R55 Syncope and collapse: Secondary | ICD-10-CM | POA: Diagnosis not present

## 2022-11-08 DIAGNOSIS — E782 Mixed hyperlipidemia: Secondary | ICD-10-CM | POA: Diagnosis not present

## 2022-12-06 DIAGNOSIS — G4733 Obstructive sleep apnea (adult) (pediatric): Secondary | ICD-10-CM | POA: Diagnosis not present

## 2022-12-07 DIAGNOSIS — G4733 Obstructive sleep apnea (adult) (pediatric): Secondary | ICD-10-CM | POA: Diagnosis not present

## 2023-01-11 ENCOUNTER — Other Ambulatory Visit: Payer: Self-pay | Admitting: Cardiology

## 2023-01-11 DIAGNOSIS — I10 Essential (primary) hypertension: Secondary | ICD-10-CM

## 2023-01-17 ENCOUNTER — Other Ambulatory Visit: Payer: Self-pay

## 2023-01-17 ENCOUNTER — Emergency Department (HOSPITAL_COMMUNITY): Payer: BC Managed Care – PPO

## 2023-01-17 ENCOUNTER — Emergency Department (HOSPITAL_COMMUNITY)
Admission: EM | Admit: 2023-01-17 | Discharge: 2023-01-17 | Disposition: A | Discharge status: Home / Self Care | Payer: BC Managed Care – PPO | Attending: Emergency Medicine | Admitting: Emergency Medicine

## 2023-01-17 DIAGNOSIS — Z79899 Other long term (current) drug therapy: Secondary | ICD-10-CM | POA: Diagnosis not present

## 2023-01-17 DIAGNOSIS — Z20822 Contact with and (suspected) exposure to covid-19: Secondary | ICD-10-CM | POA: Insufficient documentation

## 2023-01-17 DIAGNOSIS — E860 Dehydration: Secondary | ICD-10-CM | POA: Insufficient documentation

## 2023-01-17 DIAGNOSIS — I10 Essential (primary) hypertension: Secondary | ICD-10-CM | POA: Insufficient documentation

## 2023-01-17 DIAGNOSIS — R531 Weakness: Secondary | ICD-10-CM | POA: Diagnosis not present

## 2023-01-17 DIAGNOSIS — R55 Syncope and collapse: Secondary | ICD-10-CM | POA: Insufficient documentation

## 2023-01-17 DIAGNOSIS — R0689 Other abnormalities of breathing: Secondary | ICD-10-CM | POA: Diagnosis not present

## 2023-01-17 LAB — CBC WITH DIFFERENTIAL/PLATELET
Abs Immature Granulocytes: 0.03 10*3/uL (ref 0.00–0.07)
Basophils Absolute: 0 10*3/uL (ref 0.0–0.1)
Basophils Relative: 0 %
Eosinophils Absolute: 0 10*3/uL (ref 0.0–0.5)
Eosinophils Relative: 1 %
HCT: 35 % — ABNORMAL LOW (ref 39.0–52.0)
Hemoglobin: 11.4 g/dL — ABNORMAL LOW (ref 13.0–17.0)
Immature Granulocytes: 1 %
Lymphocytes Relative: 22 %
Lymphs Abs: 1.1 10*3/uL (ref 0.7–4.0)
MCH: 30.3 pg (ref 26.0–34.0)
MCHC: 32.6 g/dL (ref 30.0–36.0)
MCV: 93.1 fL (ref 80.0–100.0)
Monocytes Absolute: 0.5 10*3/uL (ref 0.1–1.0)
Monocytes Relative: 10 %
Neutro Abs: 3.3 10*3/uL (ref 1.7–7.7)
Neutrophils Relative %: 66 %
Platelets: 212 10*3/uL (ref 150–400)
RBC: 3.76 MIL/uL — ABNORMAL LOW (ref 4.22–5.81)
RDW: 13.8 % (ref 11.5–15.5)
WBC: 5 10*3/uL (ref 4.0–10.5)
nRBC: 0 % (ref 0.0–0.2)

## 2023-01-17 LAB — BASIC METABOLIC PANEL
Anion gap: 15 (ref 5–15)
BUN: 27 mg/dL — ABNORMAL HIGH (ref 8–23)
CO2: 20 mmol/L — ABNORMAL LOW (ref 22–32)
Calcium: 9.8 mg/dL (ref 8.9–10.3)
Chloride: 104 mmol/L (ref 98–111)
Creatinine, Ser: 1.85 mg/dL — ABNORMAL HIGH (ref 0.61–1.24)
GFR, Estimated: 38 mL/min — ABNORMAL LOW (ref >60)
Glucose, Bld: 83 mg/dL (ref 70–99)
Potassium: 4.5 mmol/L (ref 3.5–5.1)
Sodium: 139 mmol/L (ref 135–145)

## 2023-01-17 LAB — TROPONIN I (HIGH SENSITIVITY)
Troponin I (High Sensitivity): 6 ng/L (ref <18)
Troponin I (High Sensitivity): 6 ng/L (ref <18)

## 2023-01-17 LAB — SARS CORONAVIRUS 2 BY RT PCR: SARS Coronavirus 2 by RT PCR: NEGATIVE

## 2023-01-17 LAB — CBG MONITORING, ED: Glucose-Capillary: 70 mg/dL (ref 70–99)

## 2023-01-17 MED ORDER — SODIUM CHLORIDE 0.9 % IV BOLUS
1000.0000 mL | Freq: Once | INTRAVENOUS | Status: AC
  Administered 2023-01-17: 1000 mL via INTRAVENOUS

## 2023-01-17 NOTE — ED Triage Notes (Signed)
Pt bib [facility] from home after a near syncopal event today. Pt did not eat or drink anything and went to do yard work this am. Pt got "woozy" and brought himself down to the ground to crawl inside. Pt was seen a few months ago for the same. Pt bp was 80/50 on EMS arrival. Received NS en route bringing bp up to 120/80. Pt arrives with no complaints. AOx4, ambulatory, resp EU

## 2023-01-17 NOTE — ED Provider Notes (Signed)
[place] EMERGENCY DEPARTMENT AT [facility] Provider Note   CSN: [id number] Arrival date & time: 01/17/23  1034     History  No chief complaint on file.   Keith Webb is a 72 y.o. male w/ hx of orthostatic hypotension, syncope, HLD, HTN, presenting to Emergency Department episode of near syncope.  The patient reports did not have breakfast today but did not take any of his blood pressure medications.  He said he was outside doing yard work, and he was wearing a jacket because it was raining, and he was sweating into the jacket.  He began to feel lightheaded and lowered himself to the ground.  Did not lose consciousness.  He called EMS on scene who reported his initial blood pressure was 80/50, and he was given 500 cc of fluid by EMS with improvement of his blood pressure.  On arrival the patient is asymptomatic.  He denies lightheadedness, chest pain, shortness of breath.  My review of external records endorse recently hospitalized in May of this year for similar presentation of syncope of his near syncope.  He had an echocardiogram at that time with a normal ejection fraction 65-70%, mild to moderate aortic valve regurgitation, and mildly dilated aortic root at 39 mm.  He has a known history of a mild aortic aneurysm.  On the prior hospitalization it was noted that the patient again felt woozy and had loss of consciousness at that time.  There was concerned about orthostatic hypotension and dehydration.  They decreased his dose of Norvasc and hydrochlorothiazide at that time.  The patient reports he has been making effort to drink more water but did not drink any fluids today.  He feels he was sweating more than normal today while outside doing the yard work.  HPI     Home Medications Prior to Admission medications   Medication Sig Start Date End Date Taking? Authorizing Provider  amLODipine (NORVASC) 10 MG tablet Take 0.5 tablets (5 mg total) by mouth every morning.  10/27/22   Alwyn Ren, MD  celecoxib (CELEBREX) 200 MG capsule Take 1 capsule (200 mg total) by mouth 2 (two) times daily. 11/07/22   Margarita Grizzle, MD  clotrimazole-betamethasone (LOTRISONE) cream Apply 1 Application topically 2 (two) times daily. 02/21/22   [provider]  esomeprazole (NEXIUM) 40 MG capsule Take 40 mg by mouth daily at 12 noon.    [provider]  ezetimibe-simvastatin (VYTORIN) 10-40 MG per tablet Take 1 tablet by mouth every morning.     [provider]  guanFACINE (INTUNIV) 1 MG TB24 Take 1 mg by mouth at bedtime.     [provider]  hydrochlorothiazide (HYDRODIURIL) 25 MG tablet TAKE 1 TABLET(25 MG) BY MOUTH EVERY MORNING 01/11/23   Tolia, Sunit, DO  IRON PO Take 1 tablet by mouth daily.    [provider]  losartan (COZAAR) 100 MG tablet TAKE 1 TABLET BY MOUTH DAILY Patient taking differently: Take 100 mg by mouth daily. 09/25/22   Tolia, Sunit, DO  montelukast (SINGULAIR) 10 MG tablet Take 10 mg by mouth at bedtime.    [provider]  Multiple Vitamins-Minerals (CENTRUM SILVER PO) Take 1 tablet by mouth daily.    [provider]  rosuvastatin (CRESTOR) 10 MG tablet Take 10 mg by mouth daily. 07/17/22   [provider]  sildenafil (VIAGRA) 100 MG tablet Take 100 mg by mouth daily as needed for erectile dysfunction.    [provider]  Allergies    Patient has no known allergies.    Review of Systems   Review of Systems  Physical Exam Updated Vital Signs BP 135/89   Pulse (!) 57   Temp 98 F (36.7 C) (Oral)   Resp (!) 23   Ht 5\' 7"  (1.702 m)   Wt 81.6 kg   SpO2 100%   BMI 28.19 kg/m  Physical Exam Constitutional:      General: He is not in acute distress. HENT:     Head: Normocephalic and atraumatic.  Eyes:     Conjunctiva/sclera: Conjunctivae normal.     Pupils: Pupils are equal, round, and reactive to light.  Cardiovascular:     Rate and Rhythm: Normal rate  and regular rhythm.  Pulmonary:     Effort: Pulmonary effort is normal. No respiratory distress.  Abdominal:     General: There is no distension.     Tenderness: There is no abdominal tenderness.  Skin:    General: Skin is warm and dry.  Neurological:     General: No focal deficit present.     Mental Status: He is alert. Mental status is at baseline.  Psychiatric:        Mood and Affect: Mood normal.        Behavior: Behavior normal.     ED Results / Procedures / Treatments   Labs (all labs ordered are listed, but only abnormal results are displayed) Labs Reviewed  BASIC METABOLIC PANEL - Abnormal; Notable for the following components:      Result Value   CO2 20 (*)    BUN 27 (*)    Creatinine, Ser 1.85 (*)    GFR, Estimated 38 (*)    All other components within normal limits  CBC WITH DIFFERENTIAL/PLATELET - Abnormal; Notable for the following components:   RBC 3.76 (*)    Hemoglobin 11.4 (*)    HCT 35.0 (*)    All other components within normal limits  SARS CORONAVIRUS 2 BY RT PCR  CBG MONITORING, ED  TROPONIN I (HIGH SENSITIVITY)  TROPONIN I (HIGH SENSITIVITY)    EKG EKG Interpretation Date/Time:  Wednesday January 17 2023 10:44:22 EDT Ventricular Rate:  73 PR Interval:  155 QRS Duration:  83 QT Interval:  404 QTC Calculation: 446 R Axis:   74  Text Interpretation: Sinus rhythm Confirmed by Alvester Chou [phone]) on 01/17/2023 10:54:58 AM  Radiology DG Chest 2 View  Result Date: 01/17/2023 CLINICAL DATA:  near syncope EXAM: CHEST - 2 VIEW COMPARISON:  10/26/2022 FINDINGS: Lungs are clear. Heart size and mediastinal contours are within normal limits. No effusion. Visualized bones unremarkable. IMPRESSION: No acute cardiopulmonary disease. Electronically Signed   By: Corlis Leak M.D.   On: 01/17/2023 12:57    Procedures Procedures    Medications Ordered in ED Medications  sodium chloride 0.9 % bolus 1,000 mL (0 mLs Intravenous Stopped 01/17/23 1434)    ED  Course/ Medical Decision Making/ A&P                                 Medical Decision Making Amount and/or Complexity of Data Reviewed Labs: ordered. Radiology: ordered.   This patient presents to the ED with concern for lightheadedness, near syncope. This involves an extensive number of treatment options, and is a complaint that carries with it a high risk of complications and morbidity.  The differential diagnosis includes orthostatic hypotension secondary to  dehydration, versus vasovagal episode, versus arrhythmia, versus anemia, versus other  Co-morbidities that complicate the patient evaluation: History of hypotensive episodes  Additional history obtained from EMS  External records from outside source obtained and reviewed including most recent hospitalization course and echocardiogram  I ordered and personally interpreted labs.  The pertinent results include: Elevation of BUN and creatinine which are consistent again with dehydration type picture.  No leukocytosis.  Troponin unremarkable.  Hemoglobin near baseline at 11.4.  I ordered imaging studies including x-ray of the chest I independently visualized and interpreted imaging which showed no emergent findings I agree with the radiologist interpretation  The patient was maintained on a cardiac monitor.  I personally viewed and interpreted the cardiac monitored which showed an underlying rhythm of: Sinus rhythm  Per my interpretation the patient's ECG shows sinus rhythm no acute ischemic findings  I ordered medication including IV saline bolus for hydration  I have reviewed the patients home medicines and have made adjustments as needed  Test Considered: Doubt acute PE with his clinical presentation.  The patient's vital signs were unremarkable, no tachycardia, no hypoxia, which I would expect with a large PE responsible for syncope.  No emergent indication for CTA chest at this time.  Doubt aortic dissection or  sepsis.   After the interventions noted above, I reevaluated the patient and found that they have: improved   Dispostion:  The patient's presentation is consistent again with some dehydration and orthostasis the cause of his lightheadedness, particularly given that he was sweating a lot today.  I do not see evidence of sepsis, ACS, PE, or other life-threatening emergency to warrant hospitalization at this time.  His blood pressure has been normal and his heart rate has been normal throughout his stay here in the ED.  He is mentating well and asymptomatic.  He also had a fairly recent echocardiogram during his last hospitalization with no emergent findings.  He is wanting to go home and this is reasonable.  The patient was counseled on the need to drink water more aggressively. We will also reduce his Cozaar dosing in half from 100 mg to 50 mg daily (he can continue hydrochlorothiazide 12.5 mg daily and amlodipine 5 mg daily).  After consideration of the diagnostic results and the patients response to treatment, I feel that the patent would benefit from close outpatient follow-up.         Final Clinical Impression(s) / ED Diagnoses Final diagnoses:  Dehydration  Near syncope    Rx / DC Orders ED Discharge Orders     None         , Kermit Balo, MD 01/17/23 1442

## 2023-01-17 NOTE — Discharge Instructions (Addendum)
Your blood work showed that you were dehydrated today and you are given IV fluids.  Please make an effort to drink plenty of water at home.  If you are sweating or overexerting yourself, remember to take frequent breaks and drink lots of water.  Follow-up with your doctor in the office.

## 2023-01-18 DIAGNOSIS — N189 Chronic kidney disease, unspecified: Secondary | ICD-10-CM | POA: Diagnosis not present

## 2023-01-18 DIAGNOSIS — G4730 Sleep apnea, unspecified: Secondary | ICD-10-CM | POA: Diagnosis not present

## 2023-01-18 DIAGNOSIS — I10 Essential (primary) hypertension: Secondary | ICD-10-CM | POA: Diagnosis not present

## 2023-01-18 DIAGNOSIS — E782 Mixed hyperlipidemia: Secondary | ICD-10-CM | POA: Diagnosis not present

## 2023-04-05 ENCOUNTER — Other Ambulatory Visit: Payer: Self-pay | Admitting: Cardiology

## 2023-04-10 ENCOUNTER — Other Ambulatory Visit: Payer: Self-pay | Admitting: Cardiology

## 2023-04-10 DIAGNOSIS — I10 Essential (primary) hypertension: Secondary | ICD-10-CM

## 2023-05-02 DIAGNOSIS — I10 Essential (primary) hypertension: Secondary | ICD-10-CM | POA: Diagnosis not present

## 2023-05-02 DIAGNOSIS — N189 Chronic kidney disease, unspecified: Secondary | ICD-10-CM | POA: Diagnosis not present

## 2023-05-02 DIAGNOSIS — Z6829 Body mass index (BMI) 29.0-29.9, adult: Secondary | ICD-10-CM | POA: Diagnosis not present

## 2023-05-02 DIAGNOSIS — R7309 Other abnormal glucose: Secondary | ICD-10-CM | POA: Diagnosis not present

## 2023-05-02 DIAGNOSIS — E782 Mixed hyperlipidemia: Secondary | ICD-10-CM | POA: Diagnosis not present

## 2023-05-02 DIAGNOSIS — M130 Polyarthritis, unspecified: Secondary | ICD-10-CM | POA: Diagnosis not present

## 2023-06-21 ENCOUNTER — Other Ambulatory Visit: Payer: Self-pay | Admitting: Cardiology

## 2023-07-20 ENCOUNTER — Encounter: Payer: Self-pay | Admitting: Cardiology

## 2023-07-20 ENCOUNTER — Ambulatory Visit: Payer: BC Managed Care – PPO | Attending: Cardiology | Admitting: Cardiology

## 2023-07-20 VITALS — BP 130/80 | HR 70 | Resp 16 | Ht 67.0 in | Wt 176.0 lb

## 2023-07-20 DIAGNOSIS — I10 Essential (primary) hypertension: Secondary | ICD-10-CM

## 2023-07-20 DIAGNOSIS — I351 Nonrheumatic aortic (valve) insufficiency: Secondary | ICD-10-CM | POA: Diagnosis not present

## 2023-07-20 DIAGNOSIS — E7800 Pure hypercholesterolemia, unspecified: Secondary | ICD-10-CM | POA: Diagnosis not present

## 2023-07-20 NOTE — Patient Instructions (Addendum)
 Testing/Procedures: Echo in 06/2024  Your physician has requested that you have an echocardiogram. Echocardiography is a painless test that uses sound waves to create images of your heart. It provides your doctor with information about the size and shape of your heart and how well your heart's chambers and valves are working. This procedure takes approximately one hour. There are no restrictions for this procedure. Please do NOT wear cologne, perfume, aftershave, or lotions (deodorant is allowed). Please arrive 15 minutes prior to your appointment time.  Please note: We ask at that you not bring children with you during ultrasound (echo/ vascular) testing. Due to room size and safety concerns, children are not allowed in the ultrasound rooms during exams. Our front office staff cannot provide observation of children in our lobby area while testing is being conducted. An adult accompanying a patient to their appointment will only be allowed in the ultrasound room at the discretion of the ultrasound technician under special circumstances. We apologize for any inconvenience.    Follow-Up: At [facility], you and your health needs are our priority.  As part of our continuing mission to provide you with exceptional heart care, we have created designated Provider Care Teams.  These Care Teams include your primary Cardiologist (physician) and Advanced Practice Providers (APPs -  Physician Assistants and Nurse Practitioners) who all work together to provide you with the care you need, when you need it.  We recommend signing up for the patient portal called MyChart.  Sign up information is provided on this After Visit Summary.  MyChart is used to connect with patients for Virtual Visits (Telemedicine).  Patients are able to view lab/test results, encounter notes, upcoming appointments, etc.  Non-urgent messages can be sent to your provider as well.   To learn more about what you can do with MyChart,  go to [email or web address].    Your next appointment:   February 2026    Provider:   Madonna Large, DO     Other Instructions   1st Floor: - Lobby - Registration  - Pharmacy  - Lab - Cafe  2nd Floor: - PV Lab - Diagnostic Testing (echo, CT, nuclear med)  3rd Floor: - Vacant  4th Floor: - TCTS (cardiothoracic surgery) - AFib Clinic - Structural Heart Clinic - Vascular Surgery  - Vascular Ultrasound  5th Floor: - [facility] (general and EP) - Clinical Pharmacy for coumadin, hypertension, lipid, weight-loss medications, and med management appointments    Valet parking services will be available as well.

## 2023-07-20 NOTE — Progress Notes (Signed)
 Cardiology Office Note:  .   Date:  07/20/2023  ID:  Keith Webb, DOB [birth year], MRN [id number] PCP:  Benjamine Aland, MD  Former Cardiology Providers: Dr. Arne Fear  [facility] Providers Cardiologist:  Madonna Large, DO , [facility] (established care 11/27/2019) Electrophysiologist:  None  Click to update primary MD,subspecialty MD or APP then REFRESH:1}    Chief Complaint  Patient presents with   Nonrheumatic aortic valve insufficiency   Follow-up    History of Present Illness: .   Keith Webb is a 73 y.o. African-American male whose past medical history and cardiovascular risk factors includes: Hypertension with CKD, prediabetes, hypercholesterolemia, former cigar smoker, history of prostate carcinoma with radical prostatectomy in the past, advanced age.   Patient being followed in the practice given his underlying aortic regurgitation.  He presents today for 1 year follow-up visit.  Over the last 1 year he denies any anginal chest pain or heart failure symptoms.  No hospitalizations for cardiovascular reasons according to him.  Overall functional capacity remains stable  Review of Systems: .   Review of Systems  Cardiovascular:  Negative for chest pain, claudication, irregular heartbeat, leg swelling, near-syncope, orthopnea, palpitations, paroxysmal nocturnal dyspnea and syncope.  Respiratory:  Negative for shortness of breath.   Hematologic/Lymphatic: Negative for bleeding problem.    Studies Reviewed:   EKG: EKG Interpretation Date/Time:  Friday July 20 2023 09:12:21 EST Ventricular Rate:  55 PR Interval:  152 QRS Duration:  92 QT Interval:  408 QTC Calculation: 390 R Axis:   49  Text Interpretation: Sinus bradycardia When compared with ECG of 17-Jan-2023 10:44, No significant change since last tracing Confirmed by Large Madonna [phone]) on 07/20/2023 9:21:54 AM  Echocardiogram: 07/08/2021: LVEF 55 to 60%, no regional wall motion modalities, mild LVH,  indeterminate diastolic function, normal right ventricular size and function, mild to moderate aortic regurgitation, aortic valve sclerosis without stenosis. Estimated RAP 3 mmHg. Ascending aortic diameter 3.7 cm   10/27/2022  1. Left ventricular ejection fraction, by estimation, is 65 to 70%. The left ventricle has normal function. The left ventricle has no regional wall motion abnormalities. Left ventricular diastolic parameters are consistent with Grade I diastolic dysfunction (impaired relaxation).   2. Right ventricular systolic function is normal. The right ventricular size is normal.   3. Left atrial size was mild to moderately dilated.   4. Right atrial size was mildly dilated.   5. The mitral valve is normal in structure. No evidence of mitral valve regurgitation. No evidence of mitral stenosis.   6. The aortic valve is tricuspid. There is mild calcification of the aortic valve. Aortic valve regurgitation is mild to moderate. Aortic valve sclerosis/calcification is present, without any evidence of aortic stenosis. Aortic regurgitation PHT measures 702 msec.   7. Aortic dilatation noted. There is borderline dilatation of the ascending aorta, measuring 39 mm.   8. The inferior vena cava is normal in size with greater than 50% respiratory variability, suggesting right atrial pressure of 3 mmHg.   RADIOLOGY: NA  Risk Assessment/Calculations:   NA   Labs:       Latest Ref Rng & Units 01/17/2023   10:50 AM 10/27/2022    3:57 AM 10/26/2022    2:35 PM  CBC  WBC 4.0 - 10.5 K/uL 5.0  6.8  6.0   Hemoglobin 13.0 - 17.0 g/dL 88.5  88.3  87.4   Hematocrit 39.0 - 52.0 % 35.0  34.2  38.1   Platelets 150 -  400 K/uL 212  209  234        Latest Ref Rng & Units 01/17/2023   10:50 AM 10/27/2022    3:57 AM 10/26/2022    2:35 PM  BMP  Glucose 70 - 99 mg/dL 83  895  885   BUN 8 - 23 mg/dL 27  22  22    Creatinine 0.61 - 1.24 mg/dL 8.14  8.80  8.52   Sodium 135 - 145 mmol/L 139  136  139    Potassium 3.5 - 5.1 mmol/L 4.5  3.8  3.5   Chloride 98 - 111 mmol/L 104  105  108   CO2 22 - 32 mmol/L 20  23  23    Calcium 8.9 - 10.3 mg/dL 9.8  9.1  9.8       Latest Ref Rng & Units 01/17/2023   10:50 AM 10/27/2022    3:57 AM 10/26/2022    2:35 PM  CMP  Glucose 70 - 99 mg/dL 83  895  885   BUN 8 - 23 mg/dL 27  22  22    Creatinine 0.61 - 1.24 mg/dL 8.14  8.80  8.52   Sodium 135 - 145 mmol/L 139  136  139   Potassium 3.5 - 5.1 mmol/L 4.5  3.8  3.5   Chloride 98 - 111 mmol/L 104  105  108   CO2 22 - 32 mmol/L 20  23  23    Calcium 8.9 - 10.3 mg/dL 9.8  9.1  9.8   Total Protein 6.5 - 8.1 g/dL  5.9    Total Bilirubin 0.3 - 1.2 mg/dL  0.3    Alkaline Phos 38 - 126 U/L  39    AST 15 - 41 U/L  28    ALT 0 - 44 U/L  17      No results found for: CHOL, HDL, LDLCALC, LDLDIRECT, TRIG, CHOLHDL No results for input(s): LIPOA in the last 8760 hours. No components found for: NTPROBNP No results for input(s): PROBNP in the last 8760 hours. No results for input(s): TSH in the last 8760 hours.   Physical Exam:    Today's Vitals   07/20/23 0908  BP: 130/80  Pulse: 70  Resp: 16  SpO2: 99%  Weight: 176 lb (79.8 kg)  Height: 5' 7 (1.702 m)   Body mass index is 27.57 kg/m. Wt Readings from Last 3 Encounters:  07/20/23 176 lb (79.8 kg)  01/17/23 180 lb (81.6 kg)  11/07/22 177 lb (80.3 kg)    Physical Exam  Constitutional: No distress.  Age appropriate, hemodynamically stable.   Neck: No JVD present.  Cardiovascular: Normal rate, regular rhythm, S1 normal, S2 normal, intact distal pulses and normal pulses. Exam reveals no gallop, no S3 and no S4.  Murmur heard. Decrescendo early diastolic murmur is present with a grade of 3/4 at the upper right sternal border. Pulmonary/Chest: Effort normal and breath sounds normal. No stridor. He has no wheezes. He has no rales.  Abdominal: Soft. Bowel sounds are normal. He exhibits no distension. There is no abdominal  tenderness.  Musculoskeletal:        General: No edema.     Cervical back: Neck supple.  Neurological: He is alert and oriented to person, place, and time. He has intact cranial nerves (2-12).  Skin: Skin is warm and moist.   Impression & Recommendation(s):  Impression:   ICD-10-CM   1. Nonrheumatic aortic valve insufficiency  I35.1 EKG 12-Lead    2. Essential  hypertension  I10     3. Hypercholesterolemia  E78.00        Recommendation(s):  Nonrheumatic aortic valve insufficiency Chronic and stable. He remains asymptomatic. Last echocardiogram in May 2024 which notes mild to moderate aortic regurgitation. Will schedule a 1 year follow-up study in January 2026 with a follow-up visit in February 2026. Home blood pressures are well-controlled SBP ranges between 120-130 mmHg blood pressure medications. Indications to seek medical attention sooner-discussed with the patient at today's office visit.  Essential hypertension Office and home blood pressures are very well-controlled. Continue amlodipine  5 mg p.o. a.m. Continue hydrochlorothiazide  25 mg p.o. every morning. Continue losartan  100 mg p.o. daily Reemphasized importance of low-salt diet.  Hypercholesterolemia Currently on Crestor 10 mg p.o. daily.   He denies myalgia or other side effects. Cardiology is following peripherally.  Orders Placed:  Orders Placed This Encounter  Procedures   EKG 12-Lead   Final Medication List:   No orders of the defined types were placed in this encounter.   There are no discontinued medications.   Current Outpatient Medications:    amLODipine  (NORVASC ) 10 MG tablet, Take 0.5 tablets (5 mg total) by mouth every morning., Disp: 30 tablet, Rfl: 2   clotrimazole-betamethasone  (LOTRISONE) cream, Apply 1 Application topically 2 (two) times daily., Disp: , Rfl:    esomeprazole (NEXIUM) 40 MG capsule, Take 40 mg by mouth daily at 12 noon., Disp: , Rfl:    ezetimibe -simvastatin  (VYTORIN ) 10-40  MG per tablet, Take 1 tablet by mouth every morning. , Disp: , Rfl:    guanFACINE  (INTUNIV ) 1 MG TB24, Take 1 mg by mouth at bedtime. , Disp: , Rfl:    hydrochlorothiazide  (HYDRODIURIL ) 25 MG tablet, TAKE 1 TABLET(25 MG) BY MOUTH EVERY MORNING, Disp: 90 tablet, Rfl: 0   losartan  (COZAAR ) 100 MG tablet, TAKE 1 TABLET BY MOUTH DAILY, Disp: 90 tablet, Rfl: 3   montelukast  (SINGULAIR ) 10 MG tablet, Take 10 mg by mouth at bedtime., Disp: , Rfl:    Multiple Vitamins-Minerals (CENTRUM SILVER PO), Take 1 tablet by mouth daily., Disp: , Rfl:    rosuvastatin (CRESTOR) 10 MG tablet, Take 10 mg by mouth daily., Disp: , Rfl:    sildenafil (VIAGRA) 100 MG tablet, Take 100 mg by mouth daily as needed for erectile dysfunction., Disp: , Rfl:    celecoxib  (CELEBREX ) 200 MG capsule, Take 1 capsule (200 mg total) by mouth 2 (two) times daily. (Patient not taking: Reported on 07/20/2023), Disp: 10 capsule, Rfl: 0   IRON PO, Take 1 tablet by mouth daily. (Patient not taking: Reported on 07/20/2023), Disp: , Rfl:   Consent:   NA  Disposition:   1 year follow-up sooner if needed  Patient may be asked to follow-up sooner based on the results of the above-mentioned testing.  His questions and concerns were addressed to his satisfaction. He voices understanding of the recommendations provided during this encounter.    Signed, Madonna Large, DO, [facility]  [facility]  [address] [place], [place] [address] 07/20/2023 9:33 AM

## 2023-07-23 ENCOUNTER — Other Ambulatory Visit: Payer: Self-pay | Admitting: Cardiology

## 2023-07-23 DIAGNOSIS — I10 Essential (primary) hypertension: Secondary | ICD-10-CM

## 2023-07-27 DIAGNOSIS — J209 Acute bronchitis, unspecified: Secondary | ICD-10-CM | POA: Diagnosis not present

## 2023-07-27 DIAGNOSIS — R062 Wheezing: Secondary | ICD-10-CM | POA: Diagnosis not present

## 2023-08-09 ENCOUNTER — Other Ambulatory Visit (HOSPITAL_COMMUNITY): Payer: BC Managed Care – PPO

## 2023-10-11 ENCOUNTER — Other Ambulatory Visit: Payer: Self-pay | Admitting: Family Medicine

## 2023-10-11 ENCOUNTER — Ambulatory Visit
Admission: RE | Admit: 2023-10-11 | Discharge: 2023-10-11 | Disposition: A | Discharge status: Home / Self Care | Source: Ambulatory Visit | Attending: Family Medicine | Admitting: Family Medicine

## 2023-10-11 DIAGNOSIS — M545 Low back pain, unspecified: Secondary | ICD-10-CM

## 2023-10-11 DIAGNOSIS — R0781 Pleurodynia: Secondary | ICD-10-CM

## 2023-10-11 DIAGNOSIS — M5440 Lumbago with sciatica, unspecified side: Secondary | ICD-10-CM

## 2024-03-17 ENCOUNTER — Ambulatory Visit: Admitting: Family Medicine

## 2024-03-17 VITALS — BP 122/84 | Ht 67.0 in | Wt 175.0 lb

## 2024-03-17 DIAGNOSIS — M79671 Pain in right foot: Secondary | ICD-10-CM | POA: Diagnosis not present

## 2024-03-17 DIAGNOSIS — M722 Plantar fascial fibromatosis: Secondary | ICD-10-CM

## 2024-03-17 DIAGNOSIS — M2142 Flat foot [pes planus] (acquired), left foot: Secondary | ICD-10-CM

## 2024-03-17 DIAGNOSIS — M2170 Unequal limb length (acquired), unspecified site: Secondary | ICD-10-CM | POA: Diagnosis not present

## 2024-03-17 DIAGNOSIS — M79672 Pain in left foot: Secondary | ICD-10-CM

## 2024-03-17 DIAGNOSIS — M2141 Flat foot [pes planus] (acquired), right foot: Secondary | ICD-10-CM

## 2024-03-17 NOTE — Patient Instructions (Signed)
 The arch supports are very important. If this is too much you can pull off the white half-moon shaped scaphoid pads. Toe spacers at bedtime to help with the bunion alignment. Use topical anti-fungal from the pharmacy between your toes and on bottom of your foot for athlete's foot. Consider a drying agent like gold bond to keep the area between your toes dry. Do home exercises every other day at least for your plantar fascia. Follow up with us  in 1 month.

## 2024-03-17 NOTE — Progress Notes (Signed)
 PCP: Benjamine Aland, MD  Subjective:   HPI: Patient is a 73 y.o. male here for bilateral foot pain.  Patient denies known injury or trauma. He reports worsening chronic pain bilateral plantar feet. Soreness in base of 1st toe and known bunion on right side. Worse by end of day after he's been standing/walking on concrete for long periods. He has tried an insert for his right shoe and continues to use a heel lift on this side for leg length discrepancy.  Past Medical History:  Diagnosis Date   Arthritis    Environmental and seasonal allergies    GERD (gastroesophageal reflux disease)    Heart murmur    History of syncope    2006  orthostatic hypotension w/ dehydration   Hypercholesterolemia    Hypertension    OSA (obstructive sleep apnea)    per pt used cpap for awhile but stopped because  didn't think he needed any more   Recurrent prostate cancer [facility]) urologist-  dr richrd  oncologist-- dr jason   first dx 1998 (Gleason 7,  PSA 18.5) s/p  radical prostatectomy---  recurrent 10/ 2013  w/ salvage external radiation 06-24-2014 to 08-10-2014/  per pt (07-21-2016)  last PSA <5   S/P radiation therapy 06/24/2014 through 08/10/2014                                                     External beam of Prostate bed 6600 cGy in 33 sessions                          Wears glasses     Current Outpatient Medications on File Prior to Visit  Medication Sig Dispense Refill   amLODipine  (NORVASC ) 10 MG tablet Take 0.5 tablets (5 mg total) by mouth every morning. 30 tablet 2   celecoxib  (CELEBREX ) 200 MG capsule Take 1 capsule (200 mg total) by mouth 2 (two) times daily. (Patient not taking: Reported on 07/20/2023) 10 capsule 0   clotrimazole-betamethasone  (LOTRISONE) cream Apply 1 Application topically 2 (two) times daily.     esomeprazole (NEXIUM) 40 MG capsule Take 40 mg by mouth daily at 12 noon.     ezetimibe -simvastatin  (VYTORIN ) 10-40 MG per tablet Take 1 tablet by mouth every morning.       guanFACINE  (INTUNIV ) 1 MG TB24 Take 1 mg by mouth at bedtime.      hydrochlorothiazide  (HYDRODIURIL ) 25 MG tablet TAKE 1 TABLET(25 MG) BY MOUTH EVERY MORNING 90 tablet 3   IRON PO Take 1 tablet by mouth daily. (Patient not taking: Reported on 07/20/2023)     losartan  (COZAAR ) 100 MG tablet TAKE 1 TABLET BY MOUTH DAILY 90 tablet 3   montelukast  (SINGULAIR ) 10 MG tablet Take 10 mg by mouth at bedtime.     Multiple Vitamins-Minerals (CENTRUM SILVER PO) Take 1 tablet by mouth daily.     rosuvastatin (CRESTOR) 10 MG tablet Take 10 mg by mouth daily.     sildenafil (VIAGRA) 100 MG tablet Take 100 mg by mouth daily as needed for erectile dysfunction.     No current facility-administered medications on file prior to visit.    Past Surgical History:  Procedure Laterality Date   LAMINECTOMY WITH POSTERIOR LATERAL ARTHRODESIS LEVEL 2 N/A 07/13/2017   Procedure: Laminectomy Lumbar Two-Three, Posterior Lateral Fusion -  Lumbar One-Two-Lumbar Two-Three, extension of pedicle screw fixation  -Lumbar One-Lumbar Five;  Surgeon: Joshua Alm RAMAN, MD;  Location: [facility];  Service: Neurosurgery;  Laterality: N/A;   POSTERIOR LAMINECTOMY / DECOMPRESSION LUMBAR SPINE  12/09/2009   W/  DECOMPRESSION AND FUSION L5--S1/  REMOVAL EPIDURAL SYNOVIAL CYST (per path no neoplasm present)   POSTERIOR LAMINECTOMY / DECOMPRESSION LUMBAR SPINE  09/10/2015   decompressive laminectomy L4-5 left/  fixation and arthrodesis L3-5 /  removal hardware L5-S1   PROSTATECTOMY  04/01/1997   radical w/ bilateral pelvic lymph node dissection   RECTAL EXAM UNDER ANESTHESIA N/A 07/27/2016   Procedure: RECTAL EXAM UNDER ANESTHESIA;  Surgeon: Elspeth Schultze, MD;  Location: [facility];  Service: General;  Laterality: N/A;  Hemorrhoidal ligation, suture pexy   RIGHT INDEX FINGER SYNOVECTOMY/ EXCISION GIANT CELL TUMOR  02/19/2001   TOTAL HIP ARTHROPLASTY Left 12/08/2002   TRANSTHORACIC ECHOCARDIOGRAM  01/03/2005   ef 55-65%/  trivial AR  and MR    No Known Allergies  BP 122/84   Ht 5' 7 (1.702 m)   Wt 175 lb (79.4 kg)   BMI 27.41 kg/m       No data to display              No data to display              Objective:  Physical Exam:  Gen: NAD, comfortable in exam room  Bilateral feet/ankles: Right > left bunion formation.  Greater collapse of transverse arch on left however with callus under 2nd-4th MT heads not seen on right.  Tinea between left 3rd-5th digits and right between 4th and 5th, 2nd and 3rd digits, noted plantar feet as well.   Full range of motion ankles without pain. No tenderness to palpation left foot/ankle.  Mild tenderness right plantar fascia Negative ant drawer and negative talar tilt.   NV intact distally. Right leg 1cm shorter than left.   Assessment & Plan:  1. Bilateral foot pain - likely an element of plantar fasciitis with his long arch breakdown and bunions contributing.  Home exercises for plantar faciitis reviewed.  Sports insoles with small scaphoid pads.  Heel lift added to right to correct leg length discrepancy.  OTC treatment for his tinea pedis.  Follow up in 1 month.

## 2024-04-14 ENCOUNTER — Ambulatory Visit: Admitting: Family Medicine

## 2024-06-20 ENCOUNTER — Ambulatory Visit (HOSPITAL_COMMUNITY)
Admission: RE | Admit: 2024-06-20 | Discharge: 2024-06-20 | Disposition: A | Payer: BC Managed Care – PPO | Source: Ambulatory Visit | Attending: Cardiology

## 2024-06-20 DIAGNOSIS — I351 Nonrheumatic aortic (valve) insufficiency: Secondary | ICD-10-CM | POA: Insufficient documentation

## 2024-06-20 LAB — ECHOCARDIOGRAM COMPLETE
Area-P 1/2: 4.21 cm2
P 1/2 time: 772 ms
S' Lateral: 2.8 cm

## 2024-06-26 ENCOUNTER — Ambulatory Visit: Payer: Self-pay | Admitting: Cardiology
